# Patient Record
Sex: Female | Born: 1950 | Race: White | Hispanic: No | Marital: Married | Smoking: Former smoker
Health system: Southern US, Community
[De-identification: ages and names within clinical notes are randomized; demographics above are authoritative.]

## PROBLEM LIST (undated history)

## (undated) DIAGNOSIS — K650 Generalized (acute) peritonitis: Secondary | ICD-10-CM

## (undated) DIAGNOSIS — R188 Other ascites: Secondary | ICD-10-CM

## (undated) DIAGNOSIS — E039 Hypothyroidism, unspecified: Secondary | ICD-10-CM

## (undated) DIAGNOSIS — K219 Gastro-esophageal reflux disease without esophagitis: Secondary | ICD-10-CM

## (undated) DIAGNOSIS — E0590 Thyrotoxicosis, unspecified without thyrotoxic crisis or storm: Secondary | ICD-10-CM

## (undated) DIAGNOSIS — I10 Essential (primary) hypertension: Secondary | ICD-10-CM

## (undated) DIAGNOSIS — R519 Headache, unspecified: Secondary | ICD-10-CM

## (undated) DIAGNOSIS — C569 Malignant neoplasm of unspecified ovary: Secondary | ICD-10-CM

## (undated) DIAGNOSIS — R7989 Other specified abnormal findings of blood chemistry: Secondary | ICD-10-CM

## (undated) DIAGNOSIS — C55 Malignant neoplasm of uterus, part unspecified: Secondary | ICD-10-CM

## (undated) DIAGNOSIS — R51 Headache: Secondary | ICD-10-CM

## (undated) HISTORY — PX: THYROIDECTOMY: SHX17

## (undated) HISTORY — DX: Generalized (acute) peritonitis: K65.0

## (undated) HISTORY — PX: OTHER SURGICAL HISTORY: SHX169

## (undated) HISTORY — DX: Malignant neoplasm of uterus, part unspecified: C55

## (undated) HISTORY — DX: Other specified abnormal findings of blood chemistry: R79.89

## (undated) HISTORY — DX: Malignant neoplasm of unspecified ovary: C56.9

## (undated) HISTORY — DX: Other ascites: R18.8

## (undated) HISTORY — DX: Essential (primary) hypertension: I10

---

## 2000-02-22 ENCOUNTER — Other Ambulatory Visit: Admission: RE | Admit: 2000-02-22 | Discharge: 2000-02-22 | Payer: Self-pay | Admitting: Gynecology

## 2000-03-08 ENCOUNTER — Ambulatory Visit (HOSPITAL_COMMUNITY): Admission: RE | Admit: 2000-03-08 | Discharge: 2000-03-08 | Payer: Self-pay | Admitting: Gynecology

## 2002-08-02 ENCOUNTER — Emergency Department (HOSPITAL_COMMUNITY): Admission: EM | Admit: 2002-08-02 | Discharge: 2002-08-02 | Payer: Self-pay | Admitting: Emergency Medicine

## 2002-08-02 ENCOUNTER — Encounter: Payer: Self-pay | Admitting: Emergency Medicine

## 2006-03-18 ENCOUNTER — Emergency Department (HOSPITAL_COMMUNITY): Admission: EM | Admit: 2006-03-18 | Discharge: 2006-03-18 | Payer: Self-pay | Admitting: Emergency Medicine

## 2014-12-25 ENCOUNTER — Encounter: Payer: Self-pay | Admitting: Gynecology

## 2014-12-25 ENCOUNTER — Other Ambulatory Visit (HOSPITAL_COMMUNITY)
Admission: RE | Admit: 2014-12-25 | Discharge: 2014-12-25 | Disposition: A | Discharge status: Home / Self Care | Payer: 59 | Source: Ambulatory Visit | Attending: Gynecology | Admitting: Gynecology

## 2014-12-25 ENCOUNTER — Ambulatory Visit: Payer: 59 | Attending: Gynecology | Admitting: Gynecology

## 2014-12-25 VITALS — BP 130/57 | HR 72 | Temp 97.7°F | Resp 18 | Ht 67.0 in | Wt 183.6 lb

## 2014-12-25 DIAGNOSIS — R938 Abnormal findings on diagnostic imaging of other specified body structures: Secondary | ICD-10-CM | POA: Insufficient documentation

## 2014-12-25 DIAGNOSIS — C799 Secondary malignant neoplasm of unspecified site: Secondary | ICD-10-CM

## 2014-12-25 DIAGNOSIS — R180 Malignant ascites: Secondary | ICD-10-CM | POA: Insufficient documentation

## 2014-12-25 DIAGNOSIS — Z01411 Encounter for gynecological examination (general) (routine) with abnormal findings: Secondary | ICD-10-CM | POA: Insufficient documentation

## 2014-12-25 DIAGNOSIS — Z791 Long term (current) use of non-steroidal anti-inflammatories (NSAID): Secondary | ICD-10-CM | POA: Diagnosis not present

## 2014-12-25 DIAGNOSIS — N882 Stricture and stenosis of cervix uteri: Secondary | ICD-10-CM

## 2014-12-25 DIAGNOSIS — I10 Essential (primary) hypertension: Secondary | ICD-10-CM | POA: Diagnosis not present

## 2014-12-25 DIAGNOSIS — N950 Postmenopausal bleeding: Secondary | ICD-10-CM | POA: Insufficient documentation

## 2014-12-25 DIAGNOSIS — C801 Malignant (primary) neoplasm, unspecified: Secondary | ICD-10-CM | POA: Insufficient documentation

## 2014-12-25 DIAGNOSIS — M4802 Spinal stenosis, cervical region: Secondary | ICD-10-CM | POA: Diagnosis not present

## 2014-12-25 NOTE — Patient Instructions (Addendum)
Preparing for your Surgery  Plan for surgery on February 23 with Dr. Denman George.  Pre-operative Testing -You will receive a phone call from presurgical testing at [facility] to arrange for a pre-operative testing appointment before your surgery.  This appointment normally occurs one to two weeks before your scheduled surgery.   -Bring your insurance card, copy of an advanced directive if applicable, medication list  -At that visit, you will be asked to sign a consent for a possible blood transfusion in case a transfusion becomes necessary during surgery.  The need for a blood transfusion is rare but having consent is a necessary part of your care.     -You should not be taking blood thinners or aspirin at least ten days prior to surgery unless instructed by your surgeon.  Day Before Surgery at [place] will be asked to take in only clear liquids the day before surgery.  Examples of clear liquids include broths, jello, and clear juices. You will be advised to have nothing to eat or drink after midnight the evening before.    Your role in recovery Your role is to become active as soon as directed by your doctor, while still giving yourself time to heal.  Rest when you feel tired. You will be asked to do the following in order to speed your recovery:  - Cough and breathe deeply. This helps toclear and expand your lungs and can prevent pneumonia. You may be given a spirometer to practice deep breathing. A staff member will show you how to use the spirometer. - Do mild physical activity. Walking or moving your legs help your circulation and body functions return to normal. A staff member will help you when you try to walk and will provide you with simple exercises. Do not try to get up or walk alone the first time. - Actively manage your pain. Managing your pain lets you move in comfort. We will ask you to rate your pain on a scale of zero to 10. It is your responsibility to tell  your doctor or nurse where and how much you hurt so your pain can be treated.  Special Considerations -If you are diabetic, you may be placed on insulin after surgery to have closer control over your blood sugars to promote healing and recovery.  This does not mean that you will be discharged on insulin.  If applicable, your oral antidiabetics will be resumed when you are tolerating a solid diet.  -Your final pathology results from surgery should be available by the Friday after surgery and the results will be relayed to you when available.  Blood Transfusion Information WHAT IS A BLOOD TRANSFUSION? A transfusion is the replacement of blood or some of its parts. Blood is made up of multiple cells which provide different functions.  Red blood cells carry oxygen and are used for blood loss replacement.  White blood cells fight against infection.  Platelets control bleeding.  Plasma helps clot blood.  Other blood products are available for specialized needs, such as hemophilia or other clotting disorders. BEFORE THE TRANSFUSION  Who gives blood for transfusions?   You may be able to donate blood to be used at a later date on yourself (autologous donation).  Relatives can be asked to donate blood. This is generally not any safer than if you have received blood from a stranger. The same precautions are taken to ensure safety when a relative's blood is donated.  Healthy volunteers who are fully evaluated  to make sure their blood is safe. This is blood bank blood. Transfusion therapy is the safest it has ever been in the practice of medicine. Before blood is taken from a donor, a complete history is taken to make sure that person has no history of diseases nor engages in risky social behavior (examples are intravenous drug use or sexual activity with multiple partners). The donor's travel history is screened to minimize risk of transmitting infections, such as malaria. The donated blood is  tested for signs of infectious diseases, such as HIV and hepatitis. The blood is then tested to be sure it is compatible with you in order to minimize the chance of a transfusion reaction. If you or a relative donates blood, this is often done in anticipation of surgery and is not appropriate for emergency situations. It takes many days to process the donated blood. RISKS AND COMPLICATIONS Although transfusion therapy is very safe and saves many lives, the main dangers of transfusion include:   Getting an infectious disease.  Developing a transfusion reaction. This is an allergic reaction to something in the blood you were given. Every precaution is taken to prevent this. The decision to have a blood transfusion has been considered carefully by your caregiver before blood is given. Blood is not given unless the benefits outweigh the risks.

## 2014-12-25 NOTE — Progress Notes (Signed)
Consult Note: Gyn-Onc   Jessica Payne 64 y.o. female  Chief Complaint  Patient presents with  . metastatic carcinoma    Assessment : Malignant ascites, postmenopausal bleeding, thickened endometrium which we are unable to sample secondary to cervical stenosis. 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  Plan: I reviewed with the patient and her son and husband the findings noted above and indicated I believe she either had ovarian cancer or endometrial cancer that was metastatic and the peritoneal cavity. I recommend she undergo exploratory laparotomy told not instructed bilateral salpingo-oophorectomy and omentectomy. Intraoperative frozen section would guide any further surgical staging. Risks of surgery were reviewed and questions are answered. 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  Options regarding where surgical procedure will be performed were discussed. Patient was offered to have surgery at [facility] on February 16 or [facility] on February 23 with Dr. Everitt Amber. The patient prefers to have surgery in [place] on the 23rd. We'll obtain a CA-125 as part of her preoperative workup. The patient understands that if she becomes uncomfortable, we can perform paracentesis prior to surgery.  HPI: 64 year old white female seen in consultation at the request of Dr.Saunders [facility]) regarding management of newly diagnosed malignant ascites.  The patient reports appropriate approximate 6 months she's noted abdominal distention which has progressively worsened. She underwent paracentesis on 12/11/2014 removing 3.5 L of ascites. 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 The patient underwent imaging with an ultrasound CT and MRI which demonstrated omental thickening, extensive ascites,  slight enlargement of the right adnexa and a thickened endometrium (1.1 cm). On direct questioning the patient admits to having postmenopausal bleeding for the past 6 months. She also reports that she's had an abnormal Pap smear in the last 6 months but due to lack of insurance did not have that evaluated further.  The patient [facility] with abdominal pain and presumed peritonitis. The ascites fluid did not grow any bacteria. The patient was treated with a one-week course of Rocephin. Her pain is resolved she's had no fevers.  The patient denies any family history of breast, endometrial, or ovarian cancer.    Review of Systems:10 point review of systems is negative except as noted in interval history.   Vitals: Blood pressure 130/57, pulse 72, temperature 97.7 F (36.5 C), temperature source Oral, resp. rate 18, height 5\' 7"  (1.702 m), weight 183 lb 9.6 oz (83.28 kg).  Physical Exam: General : The patient is a healthy woman in no acute distress.  HEENT: normocephalic, extraoccular movements normal; neck is supple without thyromegally  Lynphnodes: Supraclavicular and inguinal nodes not enlarged  Abdomen: Distended and tense with shifting dullness consistent with ascites. no organomegally, no masses, no hernias  Pelvic:  EGBUS: Normal female  Vagina: Normal, no lesions  Urethra and Bladder: Normal, non-tender  Cervix: Appears normal although there is blood from the cervical os. The cervix is stenotic and I am unable to sound the cervix or dilated with an os finder Uterus: Anterior normal shape size and consistency. Bi-manual examination: Non-tender; no adenxal masses or nodularity  Rectal: normal sphincter tone, no masses, no blood  Lower extremities: No edema or varicosities. Normal range of motion     Procedure note: I attempted to obtain an endometrial biopsy but the cervix is stenotic. I'm unable to dilate the  cervix with an os finder. Pap smear  was obtained.   Allergies  Allergen Reactions  . Ciprofloxacin   . Penicillins Hives  . Tramadol Nausea And Vomiting    Past Medical History  Diagnosis Date  . Hypertension   . Acute peritonitis   . Ascites   . Low TSH level     Past Surgical History  Procedure Laterality Date  . Thyroidectomy      Current Outpatient Prescriptions  Medication Sig Dispense Refill  . furosemide (LASIX) 20 MG tablet Take 20 mg by mouth daily.    Marland Kitchen lisinopril (PRINIVIL,ZESTRIL) 10 MG tablet Take 10 mg by mouth daily.    Marland Kitchen NAPROXEN PO Take 440 mg by mouth as needed.    Marland Kitchen oxyCODONE-acetaminophen (PERCOCET/ROXICET) 5-325 MG per tablet Take by mouth every 6 (six) hours as needed.    Marland Kitchen spironolactone (ALDACTONE) 50 MG tablet Take 50 mg by mouth daily.    Marland Kitchen dicyclomine (BENTYL) 10 MG capsule Take 10 mg by mouth 4 (four) times daily -  before meals and at bedtime.     No current facility-administered medications for this visit.    History   Social History  . Marital Status: Married    Spouse Name: N/A    Number of Children: N/A  . Years of Education: N/A   Occupational History  . Not on file.   Social History Main Topics  . Smoking status: Not on file  . Smokeless tobacco: Not on file  . Alcohol Use: Not on file  . Drug Use: Not on file  . Sexual Activity: Not on file   Other Topics Concern  . Not on file   Social History Narrative  . No narrative on file    No family history on file.    CLARKE-PEARSON,Emanuel Campos L, MD 12/25/2014, 10:13 AM

## 2014-12-29 LAB — CYTOLOGY - PAP

## 2014-12-30 ENCOUNTER — Other Ambulatory Visit: Payer: Self-pay | Admitting: Gynecologic Oncology

## 2014-12-30 DIAGNOSIS — R180 Malignant ascites: Secondary | ICD-10-CM

## 2014-12-30 NOTE — Progress Notes (Signed)
Patient requesting paracentesis.  Appt made for Monday, 15 th at 2:00 pm with arrival at 1:45pm.  Patient verbalizing understanding and to call if symptoms worsen so ultrasound can be called to see if an earlier opening has become available.

## 2015-01-04 ENCOUNTER — Ambulatory Visit (HOSPITAL_COMMUNITY)
Admission: RE | Admit: 2015-01-04 | Discharge: 2015-01-04 | Disposition: A | Discharge status: Home / Self Care | Payer: 59 | Source: Ambulatory Visit | Attending: Gynecologic Oncology | Admitting: Gynecologic Oncology

## 2015-01-04 DIAGNOSIS — R180 Malignant ascites: Secondary | ICD-10-CM | POA: Diagnosis not present

## 2015-01-04 NOTE — Procedures (Signed)
US guided therapeutic paracentesis performed yielding 4.2 liters slightly turbid, yellow fluid. No immediate complications.

## 2015-01-05 NOTE — Patient Instructions (Addendum)
Sarabi Sockwell  01/05/2015   Your procedure is scheduled on: 01/12/15    Report to [facility] Main  Entrance and follow signs to               [place] at       1215pm  Call this number if you have problems the morning of surgery [phone]   Remember: clear liquid diet beginning on Monday 01/11/2015.    Do not eat food after midnite..  May have clear liquids until 0730am morning of surgery then nothing my mouth.       Take these medicines the morning of surgery with A SIP OF WATER: none                                You may not have any metal on your body including hair pins and              piercings  Do not wear jewelry, make-up, lotions, powders or perfumes.             Do not wear nail polish.  Do not shave  48 hours prior to surgery.     Do not bring valuables to the hospital. [place].  Contacts, dentures or bridgework may not be worn into surgery.  Leave suitcase in the car. After surgery it may be brought to your room.         Special Instructions: coughing and deep breathing exercises, leg exercises               Please read over the following fact sheets you were given: _____________________________________________________________________                CLEAR LIQUID DIET   Foods Allowed                                                                     Foods Excluded  Coffee and tea, regular and decaf                             liquids that you cannot  Plain Jell-O in any flavor                                             see through such as: Fruit ices (not with fruit pulp)                                     milk, soups, orange juice  Iced Popsicles                                    All solid food Carbonated beverages, regular and  diet                                    Cranberry, grape and apple juices Sports drinks like Gatorade Lightly seasoned clear broth or  consume(fat free) Sugar, honey syrup  Sample Menu Breakfast                                Lunch                                     Supper Cranberry juice                    Beef broth                            Chicken broth Jell-O                                     Grape juice                           Apple juice Coffee or tea                        Jell-O                                      Popsicle                                                Coffee or tea                        Coffee or tea  _____________________________________________________________________  [facility] - Preparing for Surgery Before surgery, you can play an important role.  Because skin is not sterile, your skin needs to be as free of germs as possible.  You can reduce the number of germs on your skin by washing with CHG (chlorahexidine gluconate) soap before surgery.  CHG is an antiseptic cleaner which kills germs and bonds with the skin to continue killing germs even after washing. Please DO NOT use if you have an allergy to CHG or antibacterial soaps.  If your skin becomes reddened/irritated stop using the CHG and inform your nurse when you arrive at Short Stay. Do not shave (including legs and underarms) for at least 48 hours prior to the first CHG shower.  You may shave your face/neck. Please follow these instructions carefully:  1.  Shower with CHG Soap the night before surgery and the  morning of Surgery.  2.  If you choose to wash your hair, wash your hair first as usual with your  normal  shampoo.  3.  After you shampoo, rinse your hair and body thoroughly to remove the  shampoo.  4.  Use CHG as you would any other liquid soap.  You can apply chg directly  to the skin and wash                       Gently with a scrungie or clean washcloth.  5.  Apply the CHG Soap to your body ONLY FROM THE NECK DOWN.   Do not use on face/ open                           Wound or open sores.  Avoid contact with eyes, ears mouth and genitals (private parts).                       Wash face,  Genitals (private parts) with your normal soap.             6.  Wash thoroughly, paying special attention to the area where your surgery  will be performed.  7.  Thoroughly rinse your body with warm water from the neck down.  8.  DO NOT shower/wash with your normal soap after using and rinsing off  the CHG Soap.                9.  Pat yourself dry with a clean towel.            10.  Wear clean pajamas.            11.  Place clean sheets on your bed the night of your first shower and do not  sleep with pets. Day of Surgery : Do not apply any lotions/deodorants the morning of surgery.  Please wear clean clothes to the hospital/surgery center.  FAILURE TO FOLLOW THESE INSTRUCTIONS MAY RESULT IN THE CANCELLATION OF YOUR SURGERY PATIENT SIGNATURE_________________________________  NURSE SIGNATURE__________________________________  ________________________________________________________________________  WHAT IS A BLOOD TRANSFUSION? Blood Transfusion Information  A transfusion is the replacement of blood or some of its parts. Blood is made up of multiple cells which provide different functions.  Red blood cells carry oxygen and are used for blood loss replacement.  White blood cells fight against infection.  Platelets control bleeding.  Plasma helps clot blood.  Other blood products are available for specialized needs, such as hemophilia or other clotting disorders. BEFORE THE TRANSFUSION  Who gives blood for transfusions?   Healthy volunteers who are fully evaluated to make sure their blood is safe. This is blood bank blood. Transfusion therapy is the safest it has ever been in the practice of medicine. Before blood is taken from a donor, a complete history is taken to make sure that person has no history of diseases nor engages in risky social behavior (examples are intravenous drug use  or sexual activity with multiple partners). The donor's travel history is screened to minimize risk of transmitting infections, such as malaria. The donated blood is tested for signs of infectious diseases, such as HIV and hepatitis. The blood is then tested to be sure it is compatible with you in order to minimize the chance of a transfusion reaction. If you or a relative donates blood, this is often done in anticipation of surgery and is not appropriate for emergency situations. It takes many days to process the donated blood. RISKS AND COMPLICATIONS Although transfusion therapy is very safe and saves many lives, the main dangers of transfusion include:  1. Getting an infectious disease. 2. Developing a transfusion reaction.  This is an allergic reaction to something in the blood you were given. Every precaution is taken to prevent this. The decision to have a blood transfusion has been considered carefully by your caregiver before blood is given. Blood is not given unless the benefits outweigh the risks. AFTER THE TRANSFUSION  Right after receiving a blood transfusion, you will usually feel much better and more energetic. This is especially true if your red blood cells have gotten low (anemic). The transfusion raises the level of the red blood cells which carry oxygen, and this usually causes an energy increase.  The nurse administering the transfusion will monitor you carefully for complications. HOME CARE INSTRUCTIONS  No special instructions are needed after a transfusion. You may find your energy is better. Speak with your caregiver about any limitations on activity for underlying diseases you may have. SEEK MEDICAL CARE IF:   Your condition is not improving after your transfusion.  You develop redness or irritation at the intravenous (IV) site. SEEK IMMEDIATE MEDICAL CARE IF:  Any of the following symptoms occur over the next 12 hours:  Shaking chills.  You have a temperature by mouth  above 102 F (38.9 C), not controlled by medicine.  Chest, back, or muscle pain.  People around you feel you are not acting correctly or are confused.  Shortness of breath or difficulty breathing.  Dizziness and fainting.  You get a rash or develop hives.  You have a decrease in urine output.  Your urine turns a dark color or changes to pink, red, or brown. Any of the following symptoms occur over the next 10 days:  You have a temperature by mouth above 102 F (38.9 C), not controlled by medicine.  Shortness of breath.  Weakness after normal activity.  The white part of the eye turns yellow (jaundice).  You have a decrease in the amount of urine or are urinating less often.  Your urine turns a dark color or changes to pink, red, or brown. Document Released: 11/03/2000 Document Revised: 01/29/2012 Document Reviewed: 06/22/2008 ExitCare Patient Information 2014 [place].  _______________________________________________________________________  Incentive Spirometer  An incentive spirometer is a tool that can help keep your lungs clear and active. This tool measures how well you are filling your lungs with each breath. Taking long deep breaths may help reverse or decrease the chance of developing breathing (pulmonary) problems (especially infection) following:  A long period of time when you are unable to move or be active. BEFORE THE PROCEDURE   If the spirometer includes an indicator to show your best effort, your nurse or respiratory therapist will set it to a desired goal.  If possible, sit up straight or lean slightly forward. Try not to slouch.  Hold the incentive spirometer in an upright position. INSTRUCTIONS FOR USE  3. Sit on the edge of your bed if possible, or sit up as far as you can in bed or on a chair. 4. Hold the incentive spirometer in an upright position. 5. Breathe out normally. 6. Place the mouthpiece in your mouth and seal your lips tightly  around it. 7. Breathe in slowly and as deeply as possible, raising the piston or the ball toward the top of the column. 8. Hold your breath for 3-5 seconds or for as long as possible. Allow the piston or ball to fall to the bottom of the column. 9. Remove the mouthpiece from your mouth and breathe out normally. 10. Rest for a few seconds and repeat Steps  1 through 7 at least 10 times every 1-2 hours when you are awake. Take your time and take a few normal breaths between deep breaths. 11. The spirometer may include an indicator to show your best effort. Use the indicator as a goal to work toward during each repetition. 12. After each set of 10 deep breaths, practice coughing to be sure your lungs are clear. If you have an incision (the cut made at the time of surgery), support your incision when coughing by placing a pillow or rolled up towels firmly against it. Once you are able to get out of bed, walk around indoors and cough well. You may stop using the incentive spirometer when instructed by your caregiver.  RISKS AND COMPLICATIONS  Take your time so you do not get dizzy or light-headed.  If you are in pain, you may need to take or ask for pain medication before doing incentive spirometry. It is harder to take a deep breath if you are having pain. AFTER USE  Rest and breathe slowly and easily.  It can be helpful to keep track of a log of your progress. Your caregiver can provide you with a simple table to help with this. If you are using the spirometer at home, follow these instructions: [place] IF:   You are having difficultly using the spirometer.  You have trouble using the spirometer as often as instructed.  Your pain medication is not giving enough relief while using the spirometer.  You develop fever of 100.5 F (38.1 C) or higher. SEEK IMMEDIATE MEDICAL CARE IF:   You cough up bloody sputum that had not been present before.  You develop fever of 102 F (38.9 C)  or greater.  You develop worsening pain at or near the incision site. MAKE SURE YOU:   Understand these instructions.  Will watch your condition.  Will get help right away if you are not doing well or get worse. Document Released: 03/19/2007 Document Revised: 01/29/2012 Document Reviewed: 05/20/2007 [facility] Patient Information 2014 [place], [place].   ________________________________________________________________________

## 2015-01-07 ENCOUNTER — Encounter (HOSPITAL_COMMUNITY)
Admission: RE | Admit: 2015-01-07 | Discharge: 2015-01-07 | Disposition: A | Discharge status: Home / Self Care | Payer: 59 | Source: Ambulatory Visit | Attending: Gynecologic Oncology | Admitting: Gynecologic Oncology

## 2015-01-07 ENCOUNTER — Ambulatory Visit (HOSPITAL_COMMUNITY)
Admission: RE | Admit: 2015-01-07 | Discharge: 2015-01-07 | Disposition: A | Discharge status: Home / Self Care | Payer: 59 | Source: Ambulatory Visit | Attending: Gynecologic Oncology | Admitting: Gynecologic Oncology

## 2015-01-07 ENCOUNTER — Encounter (HOSPITAL_COMMUNITY): Payer: Self-pay

## 2015-01-07 DIAGNOSIS — I700 Atherosclerosis of aorta: Secondary | ICD-10-CM | POA: Insufficient documentation

## 2015-01-07 DIAGNOSIS — Z90710 Acquired absence of both cervix and uterus: Secondary | ICD-10-CM | POA: Insufficient documentation

## 2015-01-07 DIAGNOSIS — Z01818 Encounter for other preprocedural examination: Secondary | ICD-10-CM | POA: Insufficient documentation

## 2015-01-07 DIAGNOSIS — C799 Secondary malignant neoplasm of unspecified site: Secondary | ICD-10-CM

## 2015-01-07 DIAGNOSIS — C801 Malignant (primary) neoplasm, unspecified: Secondary | ICD-10-CM | POA: Diagnosis not present

## 2015-01-07 HISTORY — DX: Thyrotoxicosis, unspecified without thyrotoxic crisis or storm: E05.90

## 2015-01-07 HISTORY — DX: Gastro-esophageal reflux disease without esophagitis: K21.9

## 2015-01-07 HISTORY — DX: Headache, unspecified: R51.9

## 2015-01-07 HISTORY — DX: Hypothyroidism, unspecified: E03.9

## 2015-01-07 HISTORY — DX: Headache: R51

## 2015-01-07 LAB — COMPREHENSIVE METABOLIC PANEL
ALK PHOS: 88 U/L (ref 39–117)
ALT: 18 U/L (ref 0–35)
ANION GAP: 8 (ref 5–15)
AST: 20 U/L (ref 0–37)
Albumin: 3.1 g/dL — ABNORMAL LOW (ref 3.5–5.2)
BILIRUBIN TOTAL: 0.3 mg/dL (ref 0.3–1.2)
BUN: 15 mg/dL (ref 6–23)
CHLORIDE: 102 mmol/L (ref 96–112)
CO2: 28 mmol/L (ref 19–32)
CREATININE: 0.64 mg/dL (ref 0.50–1.10)
Calcium: 8.6 mg/dL (ref 8.4–10.5)
GFR calc Af Amer: >90 mL/min (ref >90)
GLUCOSE: 111 mg/dL — AB (ref 70–99)
POTASSIUM: 4.2 mmol/L (ref 3.5–5.1)
Sodium: 138 mmol/L (ref 135–145)
Total Protein: 6.5 g/dL (ref 6.0–8.3)

## 2015-01-07 LAB — URINE MICROSCOPIC-ADD ON

## 2015-01-07 LAB — CBC WITH DIFFERENTIAL/PLATELET
BASOS PCT: 0 % (ref 0–1)
Basophils Absolute: 0 10*3/uL (ref 0.0–0.1)
EOS ABS: 0.2 10*3/uL (ref 0.0–0.7)
Eosinophils Relative: 2 % (ref 0–5)
HEMATOCRIT: 41.1 % (ref 36.0–46.0)
Hemoglobin: 13.7 g/dL (ref 12.0–15.0)
Lymphocytes Relative: 28 % (ref 12–46)
Lymphs Abs: 2.5 10*3/uL (ref 0.7–4.0)
MCH: 29.3 pg (ref 26.0–34.0)
MCHC: 33.3 g/dL (ref 30.0–36.0)
MCV: 88 fL (ref 78.0–100.0)
MONO ABS: 0.9 10*3/uL (ref 0.1–1.0)
Monocytes Relative: 10 % (ref 3–12)
Neutro Abs: 5.5 10*3/uL (ref 1.7–7.7)
Neutrophils Relative %: 60 % (ref 43–77)
Platelets: 293 10*3/uL (ref 150–400)
RBC: 4.67 MIL/uL (ref 3.87–5.11)
RDW: 13.3 % (ref 11.5–15.5)
WBC: 9.2 10*3/uL (ref 4.0–10.5)

## 2015-01-07 LAB — URINALYSIS, ROUTINE W REFLEX MICROSCOPIC
BILIRUBIN URINE: NEGATIVE
Glucose, UA: NEGATIVE mg/dL
KETONES UR: NEGATIVE mg/dL
Leukocytes, UA: NEGATIVE
NITRITE: NEGATIVE
PROTEIN: NEGATIVE mg/dL
Specific Gravity, Urine: 1.019 (ref 1.005–1.030)
UROBILINOGEN UA: 0.2 mg/dL (ref 0.0–1.0)
pH: 5.5 (ref 5.0–8.0)

## 2015-01-07 LAB — ABO/RH: ABO/RH(D): O NEG

## 2015-01-08 LAB — CA 125: CA 125: 1941 U/mL — ABNORMAL HIGH (ref 0.0–34.0)

## 2015-01-11 MED ORDER — DEXTROSE 5 % IV SOLN
5.0000 mg/kg | INTRAVENOUS | Status: AC
  Administered 2015-01-12: 400 mg via INTRAVENOUS
  Filled 2015-01-11: qty 10

## 2015-01-11 MED ORDER — CLINDAMYCIN PHOSPHATE 900 MG/50ML IV SOLN
900.0000 mg | INTRAVENOUS | Status: AC
  Administered 2015-01-12: 900 mg via INTRAVENOUS

## 2015-01-12 ENCOUNTER — Encounter (HOSPITAL_COMMUNITY): Payer: Self-pay | Admitting: Anesthesiology

## 2015-01-12 ENCOUNTER — Encounter (HOSPITAL_COMMUNITY)
Admission: RE | Disposition: A | Discharge status: Home / Self Care | Payer: Self-pay | Source: Ambulatory Visit | Attending: Obstetrics & Gynecology

## 2015-01-12 ENCOUNTER — Inpatient Hospital Stay (HOSPITAL_COMMUNITY): Payer: 59 | Admitting: Anesthesiology

## 2015-01-12 ENCOUNTER — Inpatient Hospital Stay (HOSPITAL_COMMUNITY)
Admission: RE | Admit: 2015-01-12 | Discharge: 2015-01-16 | DRG: 740 | Disposition: A | Discharge status: Home / Self Care | Payer: 59 | Source: Ambulatory Visit | Attending: Obstetrics & Gynecology | Admitting: Obstetrics & Gynecology

## 2015-01-12 DIAGNOSIS — I10 Essential (primary) hypertension: Secondary | ICD-10-CM | POA: Diagnosis present

## 2015-01-12 DIAGNOSIS — C541 Malignant neoplasm of endometrium: Secondary | ICD-10-CM | POA: Diagnosis present

## 2015-01-12 DIAGNOSIS — N950 Postmenopausal bleeding: Secondary | ICD-10-CM | POA: Diagnosis present

## 2015-01-12 DIAGNOSIS — R180 Malignant ascites: Secondary | ICD-10-CM | POA: Diagnosis present

## 2015-01-12 DIAGNOSIS — K219 Gastro-esophageal reflux disease without esophagitis: Secondary | ICD-10-CM

## 2015-01-12 DIAGNOSIS — C55 Malignant neoplasm of uterus, part unspecified: Secondary | ICD-10-CM

## 2015-01-12 DIAGNOSIS — N882 Stricture and stenosis of cervix uteri: Secondary | ICD-10-CM

## 2015-01-12 DIAGNOSIS — Z87891 Personal history of nicotine dependence: Secondary | ICD-10-CM

## 2015-01-12 HISTORY — PX: SALPINGOOPHORECTOMY: SHX82

## 2015-01-12 HISTORY — PX: ABDOMINAL HYSTERECTOMY: SHX81

## 2015-01-12 HISTORY — PX: OMENTECTOMY: SHX5985

## 2015-01-12 HISTORY — PX: LAPAROTOMY: SHX154

## 2015-01-12 LAB — TYPE AND SCREEN
ABO/RH(D): O NEG
Antibody Screen: NEGATIVE

## 2015-01-12 SURGERY — LAPAROTOMY, EXPLORATORY
Anesthesia: General

## 2015-01-12 MED ORDER — EPHEDRINE SULFATE 50 MG/ML IJ SOLN
INTRAMUSCULAR | Status: DC | PRN
  Administered 2015-01-12: 10 mg via INTRAVENOUS
  Administered 2015-01-12: 5 mg via INTRAVENOUS
  Administered 2015-01-12: 10 mg via INTRAVENOUS

## 2015-01-12 MED ORDER — MIDAZOLAM HCL 5 MG/5ML IJ SOLN
INTRAMUSCULAR | Status: DC | PRN
  Administered 2015-01-12: 2 mg via INTRAVENOUS

## 2015-01-12 MED ORDER — ONDANSETRON HCL 4 MG/2ML IJ SOLN
INTRAMUSCULAR | Status: AC
  Filled 2015-01-12: qty 2

## 2015-01-12 MED ORDER — ROCURONIUM BROMIDE 100 MG/10ML IV SOLN
INTRAVENOUS | Status: AC
Start: 2015-01-12 — End: 2015-01-12
  Filled 2015-01-12: qty 1

## 2015-01-12 MED ORDER — SODIUM CHLORIDE 0.9 % IJ SOLN
INTRAMUSCULAR | Status: AC
  Filled 2015-01-12: qty 20

## 2015-01-12 MED ORDER — CLINDAMYCIN PHOSPHATE 900 MG/50ML IV SOLN
INTRAVENOUS | Status: AC
  Filled 2015-01-12: qty 50

## 2015-01-12 MED ORDER — 0.9 % SODIUM CHLORIDE (POUR BTL) OPTIME
TOPICAL | Status: DC | PRN
  Administered 2015-01-12: 2000 mL

## 2015-01-12 MED ORDER — ROCURONIUM BROMIDE 100 MG/10ML IV SOLN
INTRAVENOUS | Status: DC | PRN
  Administered 2015-01-12: 10 mg via INTRAVENOUS
  Administered 2015-01-12: 50 mg via INTRAVENOUS
  Administered 2015-01-12: 10 mg via INTRAVENOUS

## 2015-01-12 MED ORDER — LACTATED RINGERS IV SOLN
INTRAVENOUS | Status: DC | PRN
  Administered 2015-01-12 (×3): via INTRAVENOUS

## 2015-01-12 MED ORDER — ENOXAPARIN SODIUM 40 MG/0.4ML ~~LOC~~ SOLN
40.0000 mg | SUBCUTANEOUS | Status: DC
  Administered 2015-01-13 – 2015-01-16 (×4): 40 mg via SUBCUTANEOUS
  Filled 2015-01-12 (×4): qty 0.4

## 2015-01-12 MED ORDER — HYDROMORPHONE HCL 1 MG/ML IJ SOLN
0.5000 mg | INTRAMUSCULAR | Status: AC | PRN
  Administered 2015-01-14 (×2): 0.5 mg via INTRAVENOUS
  Filled 2015-01-12 (×3): qty 1

## 2015-01-12 MED ORDER — FENTANYL CITRATE 0.05 MG/ML IJ SOLN
INTRAMUSCULAR | Status: AC
  Filled 2015-01-12: qty 5

## 2015-01-12 MED ORDER — GLYCOPYRROLATE 0.2 MG/ML IJ SOLN
INTRAMUSCULAR | Status: AC
  Filled 2015-01-12: qty 2

## 2015-01-12 MED ORDER — PROPOFOL 10 MG/ML IV BOLUS
INTRAVENOUS | Status: AC
  Filled 2015-01-12: qty 20

## 2015-01-12 MED ORDER — DEXAMETHASONE SODIUM PHOSPHATE 10 MG/ML IJ SOLN
INTRAMUSCULAR | Status: AC
  Filled 2015-01-12: qty 1

## 2015-01-12 MED ORDER — DEXAMETHASONE SODIUM PHOSPHATE 10 MG/ML IJ SOLN
INTRAMUSCULAR | Status: DC | PRN
  Administered 2015-01-12: 10 mg via INTRAVENOUS

## 2015-01-12 MED ORDER — GLYCOPYRROLATE 0.2 MG/ML IJ SOLN
INTRAMUSCULAR | Status: DC | PRN
  Administered 2015-01-12: 0.6 mg via INTRAVENOUS

## 2015-01-12 MED ORDER — OXYCODONE HCL 5 MG PO TABS
5.0000 mg | ORAL_TABLET | ORAL | Status: DC | PRN
  Administered 2015-01-14 – 2015-01-16 (×4): 5 mg via ORAL
  Filled 2015-01-12 (×4): qty 1

## 2015-01-12 MED ORDER — IBUPROFEN 800 MG PO TABS
800.0000 mg | ORAL_TABLET | Freq: Three times a day (TID) | ORAL | Status: DC
  Administered 2015-01-14 – 2015-01-16 (×7): 800 mg via ORAL
  Filled 2015-01-12 (×12): qty 1

## 2015-01-12 MED ORDER — HYDROMORPHONE HCL 1 MG/ML IJ SOLN
0.2500 mg | INTRAMUSCULAR | Status: DC | PRN
  Administered 2015-01-12 (×2): 0.5 mg via INTRAVENOUS

## 2015-01-12 MED ORDER — KCL IN DEXTROSE-NACL 20-5-0.45 MEQ/L-%-% IV SOLN
INTRAVENOUS | Status: DC
  Administered 2015-01-12: 19:00:00 via INTRAVENOUS
  Administered 2015-01-13: 1000 mL via INTRAVENOUS
  Administered 2015-01-13: 14:00:00 via INTRAVENOUS
  Filled 2015-01-12 (×4): qty 1000

## 2015-01-12 MED ORDER — PROPOFOL 10 MG/ML IV BOLUS
INTRAVENOUS | Status: DC | PRN
  Administered 2015-01-12: 200 mg via INTRAVENOUS

## 2015-01-12 MED ORDER — ONDANSETRON HCL 4 MG/2ML IJ SOLN
INTRAMUSCULAR | Status: DC | PRN
  Administered 2015-01-12: 4 mg via INTRAVENOUS

## 2015-01-12 MED ORDER — ACETAMINOPHEN 10 MG/ML IV SOLN
1000.0000 mg | Freq: Four times a day (QID) | INTRAVENOUS | Status: AC
  Administered 2015-01-12 – 2015-01-13 (×4): 1000 mg via INTRAVENOUS
  Filled 2015-01-12 (×7): qty 100

## 2015-01-12 MED ORDER — GLYCOPYRROLATE 0.2 MG/ML IJ SOLN
INTRAMUSCULAR | Status: AC
  Filled 2015-01-12: qty 1

## 2015-01-12 MED ORDER — MIDAZOLAM HCL 2 MG/2ML IJ SOLN
INTRAMUSCULAR | Status: AC
  Filled 2015-01-12: qty 2

## 2015-01-12 MED ORDER — LIDOCAINE HCL (CARDIAC) 20 MG/ML IV SOLN
INTRAVENOUS | Status: AC
  Filled 2015-01-12: qty 10

## 2015-01-12 MED ORDER — ENOXAPARIN SODIUM 40 MG/0.4ML ~~LOC~~ SOLN
40.0000 mg | SUBCUTANEOUS | Status: AC
  Administered 2015-01-12: 40 mg via SUBCUTANEOUS
  Filled 2015-01-12: qty 0.4

## 2015-01-12 MED ORDER — LACTATED RINGERS IV SOLN
INTRAVENOUS | Status: DC
  Administered 2015-01-12: 1000 mL via INTRAVENOUS
  Administered 2015-01-12: 1 via INTRAVENOUS

## 2015-01-12 MED ORDER — NEOSTIGMINE METHYLSULFATE 10 MG/10ML IV SOLN
INTRAVENOUS | Status: DC | PRN
  Administered 2015-01-12: 4 mg via INTRAVENOUS

## 2015-01-12 MED ORDER — ONDANSETRON HCL 4 MG PO TABS
4.0000 mg | ORAL_TABLET | Freq: Four times a day (QID) | ORAL | Status: DC | PRN
  Filled 2015-01-12: qty 1

## 2015-01-12 MED ORDER — NEOSTIGMINE METHYLSULFATE 10 MG/10ML IV SOLN
INTRAVENOUS | Status: AC
  Filled 2015-01-12: qty 1

## 2015-01-12 MED ORDER — LACTATED RINGERS IV SOLN: INTRAVENOUS | Status: DC

## 2015-01-12 MED ORDER — ONDANSETRON HCL 4 MG/2ML IJ SOLN
4.0000 mg | Freq: Four times a day (QID) | INTRAMUSCULAR | Status: DC | PRN
  Administered 2015-01-12 – 2015-01-14 (×6): 4 mg via INTRAVENOUS
  Filled 2015-01-12 (×7): qty 2

## 2015-01-12 MED ORDER — BUPIVACAINE LIPOSOME 1.3 % IJ SUSP
INTRAMUSCULAR | Status: DC | PRN
Start: 2015-01-12 — End: 2015-01-12
  Administered 2015-01-12: 20 mL

## 2015-01-12 MED ORDER — LISINOPRIL 10 MG PO TABS
10.0000 mg | ORAL_TABLET | Freq: Every day | ORAL | Status: DC
  Administered 2015-01-12 – 2015-01-14 (×2): 10 mg via ORAL
  Filled 2015-01-12 (×5): qty 1

## 2015-01-12 MED ORDER — MAGNESIUM HYDROXIDE 400 MG/5ML PO SUSP
30.0000 mL | Freq: Three times a day (TID) | ORAL | Status: AC
  Administered 2015-01-12: 30 mL via ORAL
  Filled 2015-01-12 (×2): qty 30

## 2015-01-12 MED ORDER — BUPIVACAINE LIPOSOME 1.3 % IJ SUSP
20.0000 mL | Freq: Once | INTRAMUSCULAR | Status: DC
  Filled 2015-01-12: qty 20

## 2015-01-12 MED ORDER — LIDOCAINE HCL (CARDIAC) 20 MG/ML IV SOLN
INTRAVENOUS | Status: DC | PRN
  Administered 2015-01-12: 50 mg via INTRAVENOUS

## 2015-01-12 MED ORDER — FENTANYL CITRATE 0.05 MG/ML IJ SOLN
INTRAMUSCULAR | Status: DC | PRN
  Administered 2015-01-12: 100 ug via INTRAVENOUS
  Administered 2015-01-12 (×6): 50 ug via INTRAVENOUS

## 2015-01-12 MED ORDER — HYDROMORPHONE HCL 1 MG/ML IJ SOLN
INTRAMUSCULAR | Status: AC
  Filled 2015-01-12: qty 1

## 2015-01-12 MED ORDER — ENSURE COMPLETE PO LIQD: 237.0000 mL | Freq: Two times a day (BID) | ORAL | Status: DC

## 2015-01-12 MED ORDER — PROMETHAZINE HCL 25 MG/ML IJ SOLN: 6.2500 mg | INTRAMUSCULAR | Status: DC | PRN

## 2015-01-12 SURGICAL SUPPLY — 48 items
ATTRACTOMAT 16X20 MAGNETIC DRP (DRAPES) ×2 IMPLANT
BLADE EXTENDED COATED 6.5IN (ELECTRODE) ×3 IMPLANT
CHLORAPREP W/TINT 26ML (MISCELLANEOUS) ×3 IMPLANT
CLIP TI MEDIUM 6 (CLIP) ×2 IMPLANT
CLIP TI MEDIUM LARGE 6 (CLIP) ×4 IMPLANT
CONT SPEC 4OZ CLIKSEAL STRL BL (MISCELLANEOUS) ×2 IMPLANT
COVER SURGICAL LIGHT HANDLE (MISCELLANEOUS) ×3 IMPLANT
DRAPE INCISE IOBAN 66X45 STRL (DRAPES) ×2 IMPLANT
DRAPE UTILITY 15X26 (DRAPE) ×3 IMPLANT
DRAPE WARM FLUID 44X44 (DRAPE) ×3 IMPLANT
DRESSING TELFA ISLAND 4X8 (GAUZE/BANDAGES/DRESSINGS) ×3 IMPLANT
DRSG OPSITE POSTOP 4X10 (GAUZE/BANDAGES/DRESSINGS) ×1 IMPLANT
ELECT LIGASURE SHORT 9 REUSE (ELECTRODE) ×1 IMPLANT
ELECT REM PT RETURN 9FT ADLT (ELECTROSURGICAL) ×3
ELECTRODE REM PT RTRN 9FT ADLT (ELECTROSURGICAL) ×2 IMPLANT
FLOSEAL 10ML (HEMOSTASIS) ×2 IMPLANT
GAUZE SPONGE 4X4 12PLY STRL (GAUZE/BANDAGES/DRESSINGS) ×2 IMPLANT
GAUZE SPONGE 4X4 16PLY XRAY LF (GAUZE/BANDAGES/DRESSINGS) IMPLANT
GLOVE BIO SURGEON STRL SZ 6 (GLOVE) ×6 IMPLANT
GLOVE BIO SURGEON STRL SZ 6.5 (GLOVE) ×6 IMPLANT
GLOVE BIOGEL M STRL SZ7.5 (GLOVE) ×6 IMPLANT
GOWN STRL REUS W/ TWL LRG LVL3 (GOWN DISPOSABLE) ×4 IMPLANT
GOWN STRL REUS W/TWL LRG LVL3 (GOWN DISPOSABLE) ×6
KIT BASIN OR (CUSTOM PROCEDURE TRAY) ×3 IMPLANT
LIQUID BAND (GAUZE/BANDAGES/DRESSINGS) IMPLANT
LOOP VESSEL MAXI BLUE (MISCELLANEOUS) IMPLANT
MARKER SKIN DUAL TIP RULER LAB (MISCELLANEOUS) ×1 IMPLANT
NEEDLE HYPO 22GX1.5 SAFETY (NEEDLE) ×6 IMPLANT
NS IRRIG 1000ML POUR BTL (IV SOLUTION) ×10 IMPLANT
PACK GENERAL/GYN (CUSTOM PROCEDURE TRAY) ×3 IMPLANT
SHEET LAVH (DRAPES) ×3 IMPLANT
SPONGE LAP 18X18 X RAY DECT (DISPOSABLE) ×2 IMPLANT
STAPLER VISISTAT 35W (STAPLE) ×3 IMPLANT
SUT MNCRL AB 4-0 PS2 18 (SUTURE) IMPLANT
SUT PDS AB 1 TP1 96 (SUTURE) ×6 IMPLANT
SUT VIC AB 0 CT1 36 (SUTURE) ×10 IMPLANT
SUT VIC AB 2-0 CT1 36 (SUTURE) ×7 IMPLANT
SUT VIC AB 2-0 CT2 27 (SUTURE) ×18 IMPLANT
SUT VIC AB 2-0 SH 27 (SUTURE) ×6
SUT VIC AB 2-0 SH 27X BRD (SUTURE) ×4 IMPLANT
SUT VIC AB 3-0 CTX 36 (SUTURE) IMPLANT
SUT VICRYL 2 0 18  UND BR (SUTURE) ×1
SUT VICRYL 2 0 18 UND BR (SUTURE) ×2 IMPLANT
SYR 20CC LL (SYRINGE) ×6 IMPLANT
TOWEL OR 17X26 10 PK STRL BLUE (TOWEL DISPOSABLE) ×6 IMPLANT
TOWEL OR NON WOVEN STRL DISP B (DISPOSABLE) ×3 IMPLANT
TRAY FOLEY CATH 14FRSI W/METER (CATHETERS) ×3 IMPLANT
WATER STERILE IRR 1500ML POUR (IV SOLUTION) ×2 IMPLANT

## 2015-01-12 NOTE — H&P (View-Only) (Signed)
Consult Note: Gyn-Onc   Jessica Payne 64 y.o. female  Chief Complaint  Patient presents with  . metastatic carcinoma    Assessment : Malignant ascites, postmenopausal bleeding, thickened endometrium which we are unable to sample secondary to cervical stenosis. 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  Plan: I reviewed with the patient and her son and husband the findings noted above and indicated I believe she either had ovarian cancer or endometrial cancer that was metastatic and the peritoneal cavity. I recommend she undergo exploratory laparotomy told not instructed bilateral salpingo-oophorectomy and omentectomy. Intraoperative frozen section would guide any further surgical staging. Risks of surgery were reviewed and questions are answered. 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  Options regarding where surgical procedure will be performed were discussed. Patient was offered to have surgery at [facility] on February 16 or [facility] on February 23 with Dr. Everitt Amber. The patient prefers to have surgery in [name] on the 23rd. We'll obtain a CA-125 as part of her preoperative workup. The patient understands that if she becomes uncomfortable, we can perform paracentesis prior to surgery.  HPI: 64 year old white female seen in consultation at the request of Dr.Saunders [name]) regarding management of newly diagnosed malignant ascites.  The patient reports appropriate approximate 6 months she's noted abdominal distention which has progressively worsened. She underwent paracentesis on 12/11/2014 removing 3.5 Payne of ascites. 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 The patient underwent imaging with an ultrasound CT and MRI which demonstrated omental thickening, extensive ascites,  slight enlargement of the right adnexa and a thickened endometrium (1.1 cm). On direct questioning the patient admits to having postmenopausal bleeding for the past 6 months. She also reports that she's had an abnormal Pap smear in the last 6 months but due to lack of insurance did not have that evaluated further.  The patient was [facility]2014 with abdominal pain and presumed peritonitis. The ascites fluid did not grow any bacteria. The patient was treated with a one-week course of Rocephin. Her pain is resolved she's had no fevers.  The patient denies any family history of breast, endometrial, or ovarian cancer.    Review of Systems:10 point review of systems is negative except as noted in interval history.   Vitals: Blood pressure 130/57, pulse 72, temperature 97.7 F (36.5 C), temperature source Oral, resp. rate 18, height 5\' 7"  (1.702 m), weight 183 lb 9.6 oz (83.28 kg).  Physical Exam: General : The patient is a healthy woman in no acute distress.  HEENT: normocephalic, extraoccular movements normal; neck is supple without thyromegally  Lynphnodes: Supraclavicular and inguinal nodes not enlarged  Abdomen: Distended and tense with shifting dullness consistent with ascites. no organomegally, no masses, no hernias  Pelvic:  EGBUS: Normal female  Vagina: Normal, no lesions  Urethra and Bladder: Normal, non-tender  Cervix: Appears normal although there is blood from the cervical os. The cervix is stenotic and I am unable to sound the cervix or dilated with an os finder Uterus: Anterior normal shape size and consistency. Bi-manual examination: Non-tender; no adenxal masses or nodularity  Rectal: normal sphincter tone, no masses, no blood  Lower extremities: No edema or varicosities. Normal range of motion     Procedure note: I attempted to obtain an endometrial biopsy but the cervix is stenotic. I'm unable to dilate the  cervix with an os finder. Pap smear  was obtained.   Allergies  Allergen Reactions  . Ciprofloxacin   . Penicillins Hives  . Tramadol Nausea And Vomiting    Past Medical History  Diagnosis Date  . Hypertension   . Acute peritonitis   . Ascites   . Low TSH level     Past Surgical History  Procedure Laterality Date  . Thyroidectomy      Current Outpatient Prescriptions  Medication Sig Dispense Refill  . furosemide (LASIX) 20 MG tablet Take 20 mg by mouth daily.    Marland Kitchen lisinopril (PRINIVIL,ZESTRIL) 10 MG tablet Take 10 mg by mouth daily.    Marland Kitchen NAPROXEN PO Take 440 mg by mouth as needed.    Marland Kitchen oxyCODONE-acetaminophen (PERCOCET/ROXICET) 5-325 MG per tablet Take by mouth every 6 (six) hours as needed.    Marland Kitchen spironolactone (ALDACTONE) 50 MG tablet Take 50 mg by mouth daily.    Marland Kitchen dicyclomine (BENTYL) 10 MG capsule Take 10 mg by mouth 4 (four) times daily -  before meals and at bedtime.     No current facility-administered medications for this visit.    History   Social History  . Marital Status: Married    Spouse Name: N/A    Number of Children: N/A  . Years of Education: N/A   Occupational History  . Not on file.   Social History Main Topics  . Smoking status: Not on file  . Smokeless tobacco: Not on file  . Alcohol Use: Not on file  . Drug Use: Not on file  . Sexual Activity: Not on file   Other Topics Concern  . Not on file   Social History Narrative  . No narrative on file    No family history on file.    JessicaRollins Wrightson L, MD 12/25/2014, 10:13 AM

## 2015-01-12 NOTE — Anesthesia Procedure Notes (Addendum)
Procedure Name: Intubation Date/Time: 01/12/2015 1:42 PM Performed by: Gradyn Shein, Virgel Gess Pre-anesthesia Checklist: Patient identified, Emergency Drugs available, Suction available, Patient being monitored and Timeout performed Patient Re-evaluated:Patient Re-evaluated prior to inductionOxygen Delivery Method: Circle system utilized Preoxygenation: Pre-oxygenation with 100% oxygen Intubation Type: IV induction Ventilation: Mask ventilation without difficulty Laryngoscope Size: Mac and 4 Grade View: Grade II Tube type: Oral Tube size: 7.5 mm Number of attempts: 2 Airway Equipment and Method: Stylet and Bougie stylet Placement Confirmation: ETT inserted through vocal cords under direct vision,  positive ETCO2,  CO2 detector and breath sounds checked- equal and bilateral Secured at: 22 cm Tube secured with: Tape Dental Injury: Teeth and Oropharynx as per pre-operative assessment  Comments: Prominent teeth.

## 2015-01-12 NOTE — Anesthesia Preprocedure Evaluation (Signed)
Anesthesia Evaluation  Patient identified by MRN, date of birth, ID band Patient awake    Reviewed: Allergy & Precautions, NPO status , Patient's Chart, lab work & pertinent test results  Airway Mallampati: II  TM Distance: >3 FB Neck ROM: Full    Dental no notable dental hx.    Pulmonary neg pulmonary ROS, former smoker,  breath sounds clear to auscultation  Pulmonary exam normal       Cardiovascular Exercise Tolerance: Good hypertension, Pt. on medications Rhythm:Regular Rate:Normal     Neuro/Psych  Headaches, negative psych ROS   GI/Hepatic Neg liver ROS, GERD-  Medicated,  Endo/Other  Hypothyroidism Hyperthyroidism   Renal/GU negative Renal ROS  negative genitourinary   Musculoskeletal negative musculoskeletal ROS (+)   Abdominal   Peds negative pediatric ROS (+)  Hematology negative hematology ROS (+)   Anesthesia Other Findings   Reproductive/Obstetrics negative OB ROS                             Anesthesia Physical Anesthesia Plan  ASA: II  Anesthesia Plan: General   Post-op Pain Management:    Induction: Intravenous  Airway Management Planned: Oral ETT  Additional Equipment:   Intra-op Plan:   Post-operative Plan: Extubation in OR  Informed Consent: I have reviewed the patients History and Physical, chart, labs and discussed the procedure including the risks, benefits and alternatives for the proposed anesthesia with the patient or authorized representative who has indicated his/her understanding and acceptance.   Dental advisory given  Plan Discussed with: CRNA  Anesthesia Plan Comments:         Anesthesia Quick Evaluation

## 2015-01-12 NOTE — Op Note (Signed)
OPERATIVE NOTE  Preoperative Diagnosis: 1. Malignant ascites 2. Postmenopausal bleeding   Postoperative Diagnosis: stage IV endometrial cancer    Procedure(s) Performed: Total abdominal hysterectomy,  with bilateral salpingo-oophorectomy, omentectomy radical tumor debulking for metastatic endometrial cancer .  Surgeon: Thereasa Solo, MD.  Assistant Surgeon: Dr Lahoma Crocker, M.D. Assistant: (an MD assistant was necessary for tissue manipulation, retraction and positioning due to the complexity of the case and hospital policies).   Specimens: Uterus, Bilateral tubes / ovaries, omentum. Bladder peritoneum.   Estimated Blood Loss: 500 mL.    Urine Output:50cc  IVF: 5093OI  Complications: None.   Operative Findings: carcinomatosis, 4L ascites. Millial studding of tumor over entire small bowel serosa and mesentery, diaphragm. Plaque of tumor on bladder peritoneum. Tumor on cul de sac peritoneum. Enlarged right ovary. Normal left ovary. Rind of tumor on anterior sigmoid colon. 10cm omental cake from hepatic flexure to splenic flexure.    This represented an optimal cytoreduction (R1) with gross visible disease remaining on the small and large intestine (millial studding) <1cm at any location. 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   Procedure:   The patient was seen in the Holding Room. The risks, benefits, complications, treatment options, and expected outcomes were discussed with the patient.  The patient concurred with the proposed plan, giving informed consent.   The patient was  identified as Jessica Payne  and the procedure verified as BSO, omentectomy, tumor debulking. A Time Out was held and the above information confirmed upon entry to the operating room..  After induction of anesthesia, the patient was draped and prepped in the usual sterile manner.  She was prepped and draped in the normal  sterile fashion in the dorsal lithotomy position in padded Allen stirrups with good attention paid to support of the lower back and lower extremities. Position was adjusted for appropriate support. A Foley catheter was placed to gravity.   A midline vertical incision was made and carried through the subcutaneous tissue to the fascia. The fascial incision was made and extended superiorally. The rectus muscles were separated. The peritoneum was identified and entered. Peritoneal incision was extended longitudinally.  The abdominal cavity was entered sharply and without incident. A Bookwalter retractor was then placed. A survey of the abdomen and pelvis revealed the above findings, which were significant for carcinomatosis, omental cake, grossly normal appearing ovaries.  The omental cake was dissected free from the transverse colon from the hepatic flexure to the splenic flexure using sharp metzenbaum scissor dissection. The lesser sac was entered. The tumor cake was separated from the mesentery of the transverse colon. The short gastric vessels were sealed with ligasure and the infragastric omentum was separated from the greater curvature of the stomach removing all bulky tumor. Hemostasis was confirmed. The colon was closely inspected and was noted to be intact and hemostatic.   After packing the small bowel into the upper abdomen, we performed the hysterectomy and bilateral salpingo-oophorectomy by entering the  pelvic sidewall just posterior to the right round ligament. The pararectal space was developed and the retroperitoneum developed up to the level of the common iliac artery.  The course of the ureter was identified with ease. The right IP was then skeletonized, and taken with ligasure. The ovary was separated from its peritoneal attachments with the bovie with visualization of the ureter at all times.   The sigmoid colon was dissected from its dense tumor attachments to the left  ovary using sharp  dissection. The left retroperitoneal peritoneum was entered parallel to the sigmoid colon attachments and the left ureter was identified in the left retroperitoneal space. Using sharp and monopolar dissection, the left tube and ovary were freed from their peritoneal adhesions to the pelvis and sigmoid colon. The IP ligament was sealed with ligasure. The bladder peritoneum was skeletonized of tumor which was sent as a separate specimen. The bladder flap was then created with sharp dissection with difficulty because of tumor infiltration. The uterine arteries were skeletonized bilaterally and clamped with parametrial clamps and suture ligated. The cardinal ligaments were clamped with parametrial clamps, cut and suture ligated. The vagina was cross clamped and transected and 0-vicryl figure of 8's were used to close the vaginal cuff hemostatically. An additional figure of 8 suture was necessary on the right cardinal ligament and heme clips on the left to achieve hemostasis.  The peritoneal cavity was irrigated and hemostasis was confirmed at all surgical sites.  Residual tumor was present at the small and large bowel and peritoneal surfaces (47mm millial implants).   The fascia was reapproximated with 0 looped PDS using a total of two sutures. The subcutaneous layer was then irrigated copiously.  Exparel long acting local anesthetic was infiltrated into the subcutaneous tissues. The skin was closed with staples. The patient tolerated the procedure well.   Sponge, lap and needle counts were correct x 2.   [name], MD

## 2015-01-12 NOTE — Transfer of Care (Signed)
Immediate Anesthesia Transfer of Care Note  Patient: Jessica Payne  Procedure(s) Performed: Procedure(s): EXPLORATORY LAPAROTOMY, RADICAL DEBULKING (N/A) TOTAL HYSTERECTOMY ABDOMINAL (N/A) BILATERAL SALPINGO OOPHORECTOMY (Bilateral) OMENTECTOMY (N/A)  Patient Location: PACU  Anesthesia Type:General  Level of Consciousness: awake, alert  and oriented  Airway & Oxygen Therapy: Patient Spontanous Breathing and Patient connected to face mask oxygen  Post-op Assessment: Report given to RN  Post vital signs: Reviewed and stable  Last Vitals:  Filed Vitals:   01/12/15 1630  BP: 111/64  Pulse: 108  Temp: 36.4 C  Resp: 15    Complications: No apparent anesthesia complications

## 2015-01-12 NOTE — Interval H&P Note (Signed)
History and Physical Interval Note:  01/12/2015 12:51 PM  Jessica Payne  has presented today for surgery, with the diagnosis of METASTATIC CARCINOMA  The various methods of treatment have been discussed with the patient and family. After consideration of risks, benefits and other options for treatment, the patient has consented to  Procedure(s): EXPLORATORY LAPAROTOMY/POSSIBLE LAPAROTOMY WITH STAGING (N/A) TOTAL HYSTERECTOMY ABDOMINAL (N/A) BILATERAL SALPINGO OOPHORECTOMY (Bilateral) OMENTECTOMY (N/A) as a surgical intervention .  The patient's history has been reviewed, patient examined, no change in status, stable for surgery.  I have reviewed the patient's chart and labs.  Questions were answered to the patient's satisfaction.     [name]

## 2015-01-12 NOTE — Anesthesia Postprocedure Evaluation (Signed)
  Anesthesia Post-op Note  Patient: Jessica Payne  Procedure(s) Performed: Procedure(s) (LRB): EXPLORATORY LAPAROTOMY, RADICAL DEBULKING (N/A) TOTAL HYSTERECTOMY ABDOMINAL (N/A) BILATERAL SALPINGO OOPHORECTOMY (Bilateral) OMENTECTOMY (N/A)  Patient Location: PACU  Anesthesia Type: General  Level of Consciousness: awake and alert   Airway and Oxygen Therapy: Patient Spontanous Breathing  Post-op Pain: mild  Post-op Assessment: Post-op Vital signs reviewed, Patient's Cardiovascular Status Stable, Respiratory Function Stable, Patent Airway and No signs of Nausea or vomiting  Last Vitals:  Filed Vitals:   01/12/15 1715  BP: 107/59  Pulse: 91  Temp:   Resp: 13    Post-op Vital Signs: stable   Complications: No apparent anesthesia complications

## 2015-01-13 ENCOUNTER — Encounter (HOSPITAL_COMMUNITY): Payer: Self-pay | Admitting: Gynecologic Oncology

## 2015-01-13 LAB — CBC
HEMATOCRIT: 34.7 % — AB (ref 36.0–46.0)
HEMOGLOBIN: 11.6 g/dL — AB (ref 12.0–15.0)
MCH: 28.8 pg (ref 26.0–34.0)
MCHC: 33.4 g/dL (ref 30.0–36.0)
MCV: 86.1 fL (ref 78.0–100.0)
Platelets: 282 10*3/uL (ref 150–400)
RBC: 4.03 MIL/uL (ref 3.87–5.11)
RDW: 13.2 % (ref 11.5–15.5)
WBC: 16.6 10*3/uL — ABNORMAL HIGH (ref 4.0–10.5)

## 2015-01-13 LAB — BASIC METABOLIC PANEL
Anion gap: 9 (ref 5–15)
BUN: 11 mg/dL (ref 6–23)
CALCIUM: 8 mg/dL — AB (ref 8.4–10.5)
CO2: 27 mmol/L (ref 19–32)
Chloride: 98 mmol/L (ref 96–112)
Creatinine, Ser: 0.71 mg/dL (ref 0.50–1.10)
GFR calc Af Amer: >90 mL/min (ref >90)
Glucose, Bld: 169 mg/dL — ABNORMAL HIGH (ref 70–99)
POTASSIUM: 4.7 mmol/L (ref 3.5–5.1)
SODIUM: 134 mmol/L — AB (ref 135–145)

## 2015-01-13 MED ORDER — SPIRONOLACTONE 50 MG PO TABS
50.0000 mg | ORAL_TABLET | Freq: Every day | ORAL | Status: DC
  Administered 2015-01-14 – 2015-01-16 (×3): 50 mg via ORAL
  Filled 2015-01-13 (×4): qty 1

## 2015-01-13 MED ORDER — FUROSEMIDE 20 MG PO TABS
20.0000 mg | ORAL_TABLET | Freq: Every day | ORAL | Status: DC
  Administered 2015-01-14 – 2015-01-16 (×3): 20 mg via ORAL
  Filled 2015-01-13 (×4): qty 1

## 2015-01-13 MED ORDER — HOME MED STORE IN PYXIS: 1.0000 | Freq: Once | Status: DC

## 2015-01-13 MED ORDER — LANSOPRAZOLE 15 MG PO CPDR
15.0000 mg | DELAYED_RELEASE_CAPSULE | Freq: Every day | ORAL | Status: DC
  Filled 2015-01-13: qty 1

## 2015-01-13 MED ORDER — FAMOTIDINE 20 MG PO TABS
20.0000 mg | ORAL_TABLET | Freq: Every day | ORAL | Status: DC
  Administered 2015-01-14: 20 mg via ORAL

## 2015-01-13 NOTE — Progress Notes (Signed)
Patient states she kept down a couple sips of Pepsi otherwise unable to eat without nausea.  IV Zofran given.  Patient encouraged to walk but refused to walk at this time.

## 2015-01-13 NOTE — Care Management Note (Signed)
    Page 1 of 1   01/13/2015     11:19:34 AM CARE MANAGEMENT NOTE 01/13/2015  Patient:  Jessica Payne, Jessica Payne   Account Number:  [id number]  Date Initiated:  01/13/2015  Documentation initiated by:  Sunday Spillers  Subjective/Objective Assessment:   64 yo female admitted s/p expl lap, TAH, BSO, omentectomy radical tumor debulking. PTA lived at home with spouse.     Action/Plan:   Home when stable   Anticipated DC Date:  01/16/2015   Anticipated DC Plan:  [place]  CM consult      Choice offered to / List presented to:             Status of service:  Completed, signed off Medicare Important Message given?   (If response is "NO", the following Medicare IM given date fields will be blank) Date Medicare IM given:   Medicare IM given by:   Date Additional Medicare IM given:   Additional Medicare IM given by:    Discharge Disposition:  HOME/SELF CARE  Per UR Regulation:  Reviewed for med. necessity/level of care/duration of stay  If discussed at [place] of Stay Meetings, dates discussed:    Comments:

## 2015-01-13 NOTE — Progress Notes (Signed)
1 Day Post-Op Procedure(s) (LRB): EXPLORATORY LAPAROTOMY, RADICAL DEBULKING (N/A) TOTAL HYSTERECTOMY ABDOMINAL (N/A) BILATERAL SALPINGO OOPHORECTOMY (Bilateral) OMENTECTOMY (N/A)  Subjective: Patient reports retching, small volume emesis with all attempts to eat. Feels similar to what she experienced preop. Felt that taking prevacid helped with this and would like to try home med.  Has had 2 doses of zofran without help. No flatus. Pain well controlled  .    Objective: Vital signs in last 24 hours: Temp:  [97.3 F (36.3 C)-98.8 F (37.1 C)] 98.8 F (37.1 C) (02/24 0602) Pulse Rate:  [71-108] 75 (02/24 0602) Resp:  [12-20] 18 (02/24 0602) BP: (94-153)/(42-79) 110/50 mmHg (02/24 0602) SpO2:  [94 %-100 %] 96 % (02/24 0602) Weight:  [178 lb (80.74 kg)] 178 lb (80.74 kg) (02/23 1312) Last BM Date: 01/12/15  Intake/Output from previous day: 02/23 0701 - 02/24 0700 In: 5887.9 [P.O.:840; I.V.:4847.9; IV Piggyback:200] Out: 2060 [Urine:1560; Blood:500]  Physical Examination: General: alert and cooperative Resp: clear to auscultation bilaterally Cardio: regular rate and rhythm, S1, S2 normal, no murmur, click, rub or gallop GI: soft, non-tender; bowel sounds normal; no masses,  no organomegaly Extremities: extremities normal, atraumatic, no cyanosis or edema Vaginal Bleeding: none  Labs: WBC/Hgb/Hct/Plts:  16.6/11.6/34.7/282 (02/24 0548) BUN/Cr/glu/ALT/AST/amyl/lip:  11/0.71/--/--/--/--/-- (02/24 8088)   Assessment:  64 y.o. s/p Procedure(s): EXPLORATORY LAPAROTOMY, RADICAL DEBULKING TOTAL HYSTERECTOMY ABDOMINAL BILATERAL SALPINGO OOPHORECTOMY OMENTECTOMY: stable Pain:  Pain is well-controlled on  oral medications.  Heme:appropriate Hb postop  ID: no acute infection  CV: hx of htn, will restart lasix and aldactone.  GI:  Tolerating po: No: will try prevacid per patient request. Likely this is delayed return of bowel function secondary to debulking surgery. Anticipate  returning to normal function over the next 48 hours.    . FEN: Decrease IVF given excellent UO and normal creatinine. Will drop to 50cc/hr  Endo: no issues.  Prophylaxis: pharmacologic prophylaxis (with any of the following: patient to be discharged on this x 28 days total).  Plan: Advance diet Encourage ambulation Advance to PO medication plan for lovenox teaching for home use Dispo:  Discharge plan to include : The patient is to be discharged to home in likely 48 hours.   LOS: 1 day    [name] 01/13/2015, 9:12 AM

## 2015-01-14 MED ORDER — BISACODYL 10 MG RE SUPP
10.0000 mg | Freq: Once | RECTAL | Status: AC
  Administered 2015-01-14: 10 mg via RECTAL
  Filled 2015-01-14: qty 1

## 2015-01-14 MED ORDER — BOOST / RESOURCE BREEZE PO LIQD
1.0000 | Freq: Three times a day (TID) | ORAL | Status: DC
  Administered 2015-01-14: 1 via ORAL

## 2015-01-14 MED ORDER — FAMOTIDINE 20 MG PO TABS
20.0000 mg | ORAL_TABLET | Freq: Every day | ORAL | Status: DC
  Administered 2015-01-14 – 2015-01-16 (×3): 20 mg via ORAL

## 2015-01-14 MED ORDER — ENOXAPARIN (LOVENOX) PATIENT EDUCATION KIT
PACK | Freq: Once | Status: AC
  Administered 2015-01-14: 16:00:00
  Filled 2015-01-14: qty 1

## 2015-01-14 MED ORDER — FAMOTIDINE 20 MG PO TABS: 20.0000 mg | ORAL_TABLET | Freq: Every day | ORAL | Status: DC

## 2015-01-14 NOTE — Progress Notes (Signed)
2 Days Post-Op Procedure(s) (LRB): EXPLORATORY LAPAROTOMY, RADICAL DEBULKING (N/A) TOTAL HYSTERECTOMY ABDOMINAL (N/A) BILATERAL SALPINGO OOPHORECTOMY (Bilateral) OMENTECTOMY (N/A)  Subjective: Patient reports moderate nausea last pm that is improving.  "I think I am turning the corner.  I was able to eat some toast and mashed potatoes that are staying down."  Reporting reflux and using prilosec in the past with adequate symptom relief.  Pain relieved with PRN medications.  Ambulating in the halls without difficulty.  Denies chest pain, dyspnea, passing flatus, or having a bowel movement.  No other concerns voiced.    Objective: Vital signs in last 24 hours: Temp:  [97.5 F (36.4 C)-98.9 F (37.2 C)] 97.5 F (36.4 C) (02/25 1400) Pulse Rate:  [82-110] 110 (02/25 1400) Resp:  [18] 18 (02/25 1400) BP: (103-130)/(47-59) 103/47 mmHg (02/25 1400) SpO2:  [90 %-91 %] 90 % (02/25 1400) Last BM Date: 01/11/15  Intake/Output from previous day: 02/24 0701 - 02/25 0700 In: 1536.7 [P.O.:120; I.V.:1416.7] Out: 2550 [Urine:2550]  Physical Examination: General: alert, cooperative and no distress Resp: clear to auscultation bilaterally Cardio: regular rate and rhythm, S1, S2 normal, no murmur, click, rub or gallop GI: incision: midline incision with clear dressing, stained with dry sanguinous drainage and abdomen mildly distended, active bowel sounds, tympanic on percussion Extremities: extremities normal, atraumatic, no cyanosis or edema  Assessment: 64 y.o. s/p Procedure(s): EXPLORATORY LAPAROTOMY, RADICAL DEBULKING TOTAL HYSTERECTOMY ABDOMINAL BILATERAL SALPINGO OOPHORECTOMY OMENTECTOMY: stable Pain:  Pain is well-controlled on PRN medications.  Heme: Stable post-operatively from 01/13/15 labs  CV: BP and HR stable post-operatively.  Hx HTN- Lisinopril ordered.  GI:  Tolerating po: Yes, increasing intake.  Antiemetics PRN.  GU: Adequate output reported.    FEN: Stable post-op from  01/13/15 labs  Prophylaxis: pharmacologic prophylaxis (with any of the following: enoxaparin (Lovenox) 5m SQ 2 hours prior to surgery then every day) and intermittent pneumatic compression boots.  Plan: Advance diet as tolerated Saline lock IV Rocking chair to the room Dulcolax suppository x 1 Resource supplement drinks as tolerated Lovenox teaching kit for discharge Encourage ambulation, IS use, deep breathing, and coughing Continue post-op plan of care per Dr. JDelsa Sale  LOS: 2 days    Deshunda Thackston DEAL 01/14/2015, 3:36 PM

## 2015-01-15 NOTE — Progress Notes (Addendum)
3 Days Post-Op Procedure(s) (LRB): EXPLORATORY LAPAROTOMY, RADICAL DEBULKING (N/A) TOTAL HYSTERECTOMY ABDOMINAL (N/A) BILATERAL SALPINGO OOPHORECTOMY (Bilateral) OMENTECTOMY (N/A)  Subjective: Patient reports feeling better this am.  Drinking liquids with no nausea.  Tolerated potato soup this am.  Pain relieved with PRN medications.  Ambulating in the halls without difficulty.  Passing flatus and having loose stools after suppository yesterday pm.  Denies chest pain, dyspnea.  No other concerns voiced.    Objective: Vital signs in last 24 hours: Temp:  [97.5 F (36.4 C)-99.1 F (37.3 C)] 98.7 F (37.1 C) (02/26 0516) Pulse Rate:  [84-110] 84 (02/26 0516) Resp:  [16-18] 16 (02/26 0516) BP: (103-131)/(47-70) 115/60 mmHg (02/26 0516) SpO2:  [90 %] 90 % (02/26 0516) Last BM Date: 01/15/15  Intake/Output from previous day: 02/25 0701 - 02/26 0700 In: 1280 [P.O.:1080; I.V.:200] Out: 2425 [Urine:2425]  Physical Examination: General: alert, cooperative and no distress Resp: clear to auscultation bilaterally Cardio: regular rate and rhythm, S1, S2 normal, no murmur, click, rub or gallop GI: incision: midline incision with clear dressing, dressing removed, staples midline, no drainage or erythema and abdomen mildly distended, active bowel sounds, less tympanic on percussion Extremities: extremities normal, atraumatic, no cyanosis or edema  Mild fluid wave present during abdominal exam  Assessment: 64 y.o. s/p Procedure(s): EXPLORATORY LAPAROTOMY, RADICAL DEBULKING TOTAL HYSTERECTOMY ABDOMINAL BILATERAL SALPINGO OOPHORECTOMY OMENTECTOMY: stable Pain:  Pain is well-controlled on PRN medications.  Heme: Stable post-operatively from 01/13/15 labs  CV: BP and HR stable post-operatively.  Hx HTN- Lisinopril ordered.  GI:  Tolerating po: Yes, increasing intake.  Antiemetics PRN.  GU: Adequate output reported.    FEN: Stable post-op from 01/13/15 labs  Prophylaxis: pharmacologic  prophylaxis (with any of the following: enoxaparin (Lovenox) 34m SQ 2 hours prior to surgery then every day) and intermittent pneumatic compression boots.  Plan: Continue diet as tolerated Plan for possible discharge later today if solid diet tolerated Resource supplement drinks as tolerated Lovenox teaching kit for discharge Encourage ambulation, IS use, deep breathing, and coughing Continue post-op plan of care per Dr. JDelsa Sale  LOS: 3 days    CROSS, [name] 01/15/2015, 12:00 PM

## 2015-01-16 MED ORDER — ENOXAPARIN SODIUM 40 MG/0.4ML ~~LOC~~ SOLN: 40.0000 mg | SUBCUTANEOUS | Status: DC

## 2015-01-16 MED ORDER — OXYCODONE HCL 5 MG PO TABS: 5.0000 mg | ORAL_TABLET | ORAL | Status: DC | PRN

## 2015-01-16 NOTE — Discharge Instructions (Signed)
01/16/2015  Return to work: 6 weeks  Activity: 1. Be up and out of the bed during the day.  Take a nap if needed.  You may walk up steps but be careful and use the hand rail.  Stair climbing will tire you more than you think, you may need to stop part way and rest.   2. No lifting or straining for 6 weeks.  3. No driving for 1- 2 weeks.  Do Not drive if you are taking narcotic pain medicine.  4. Shower daily.  Use soap and water on your incision and pat dry; don't rub.   5. No sexual activity and nothing in the vagina for 6 weeks.  Diet: 1. Low sodium Heart Healthy Diet is recommended.  2. It is safe to use a laxative if you have difficulty moving your bowels.   Wound Care: 1. Keep clean and dry.  Shower daily.  Reasons to call the Doctor:   Fever - Oral temperature greater than 100.4 degrees Fahrenheit  Foul-smelling vaginal discharge  Difficulty urinating  Nausea and vomiting  Increased pain at the site of the incision that is unrelieved with pain medicine.  Difficulty breathing with or without chest pain  New calf pain especially if only on one side  Sudden, continuing increased vaginal bleeding with or without clots.     Contacts: For questions or concerns you should contact:  Dr. Lahoma Crocker at [phone]  Dr. Everitt Amber at [facility] [phone]  Hysterectomy, Abdominal  Care After Please read the instructions below. Refer to these instructions for the next few weeks. These instructions provide you with general information on caring for yourself after surgery. Your caregiver may also give you specific instructions. While your treatment has been planned according to the most current medical practices available, unavoidable problems sometimes happen. If you have any problems or questions after you leave, please call your caregiver. HOME CARE INSTRUCTIONS Healing will take time. You will have discomfort, tenderness, swelling and bruising at the operative  site for a couple of weeks. This is normal and will get better as time goes on.  9. Only take over-the-counter or prescription medicines for pain, discomfort or fever as directed by your caregiver.  10. Do not take aspirin. It can cause bleeding.  11. Do not drive when taking pain medication.  12. Follow your caregivers advice regarding diet, exercise, lifting, driving and general activities.  13. Resume your usual diet as directed and allowed.  14. Get plenty of rest and sleep.  15. Do not douche, use tampons, or have sexual intercourse until your caregiver gives you permission.  16. Change your bandages (dressings) as directed.  17. Take your temperature twice a day. Write it down.  18. Your caregiver may recommend showers instead of baths for a few weeks.  19. Do not drink alcohol until your caregiver gives you permission.  20. If you develop constipation, you may take a mild laxative with your caregivers permission. Bran foods and drinking fluids helps with constipation problems.  21. Try to have someone home with you for a week or two to help with the household activities.  54. Make sure you and your family understands everything about your operation and recovery.  23. Do not sign any legal documents until you feel normal again.  24. Keep all your follow-up appointments as recommended by your caregiver.  SEEK MEDICAL CARE IF:  There is swelling, redness or increasing pain in the wound area.   Pus is coming  from the wound.   You notice a bad smell from the wound or surgical dressing.   You have pain, redness and swelling from the intravenous site.   The wound is breaking open (the edges are not staying together).   You feel dizzy or feel like fainting.   You develop pain or bleeding when you urinate.   You develop diarrhea.   You develop nausea and vomiting.   You develop abnormal vaginal discharge.   You develop a rash.   You have any type of abnormal reaction or  develop an allergy to your medication.   You need stronger pain medication for your pain.  SEEK IMMEDIATE MEDICAL CARE:  You develop a temperature of 100.6 or higher.   You develop abdominal pain.   You develop chest pain.   You develop shortness of breath.   You pass out.   You develop pain, swelling or redness of your leg.   You develop heavy vaginal bleeding with or without blood clots.  Document Released: 05/26/2005 Document Re-Released: 04/26/2010 [facility] Patient Information 2011 [place].

## 2015-01-16 NOTE — Discharge Summary (Signed)
Physician Discharge Summary  Patient ID: Jessica Payne MRN: [id number] DOB/AGE: [birth year] 64 y.o.  Admit date: 01/12/2015 Discharge date: 01/16/2015  Admission Diagnoses: Uterine cancer  Discharge Diagnoses:  Principal Problem:   Uterine cancer Active Problems:   Malignant ascites   Postmenopausal bleeding   Discharged Condition: good  Hospital Course: On 01/12/2015, the patient underwent the following: Procedure(s): EXPLORATORY LAPAROTOMY, RADICAL DEBULKING TOTAL HYSTERECTOMY ABDOMINAL BILATERAL SALPINGO OOPHORECTOMY OMENTECTOMY.   The postoperative course was uneventful.  She was discharged to home on postoperative day 4 tolerating a regular diet.  Consults: None  Significant Diagnostic Studies: None  Treatments: surgery: see above  Discharge Exam: Blood pressure 107/56, pulse 100, temperature 97.8 F (36.6 C), temperature source Oral, resp. rate 17, height 5\' 7"  (1.702 m), weight 178 lb (80.74 kg), SpO2 92 %. General appearance: alert Resp: clear to auscultation bilaterally Cardio: regular rate and rhythm, S1, S2 normal, no murmur, click, rub or gallop GI: soft, non-tender; bowel sounds normal; no masses,  no organomegaly Extremities: extremities normal, atraumatic, no cyanosis or edema and Homans sign is negative, no sign of DVT Incision/Wound:  C/D/I  Disposition: Final discharge disposition not confirmed      Discharge Instructions    Activity as tolerated - No restrictions    Complete by:  As directed      Call MD for:  extreme fatigue    Complete by:  As directed      Call MD for:  persistant dizziness or light-headedness    Complete by:  As directed      Call MD for:  persistant nausea and vomiting    Complete by:  As directed      Call MD for:  redness, tenderness, or signs of infection (pain, swelling, redness, odor or green/yellow discharge around incision site)    Complete by:  As directed      Call MD for:  severe uncontrolled pain    Complete  by:  As directed      Call MD for:  temperature >100.4    Complete by:  As directed      Diet - low sodium heart healthy    Complete by:  As directed      Discharge wound care:    Complete by:  As directed   Keep clean and dry     Driving Restrictions    Complete by:  As directed   No driving for 1- 2 weeks     Increase activity slowly    Complete by:  As directed      Lifting restrictions    Complete by:  As directed   No lifting > 5 lbs for 6 weeks     May shower / Bathe    Complete by:  As directed   No tub baths for 6 weeks     May walk up steps    Complete by:  As directed      Sexual Activity Restrictions    Complete by:  As directed   No intercourse for 6 - 8 weeks            Medication List    STOP taking these medications        oxyCODONE-acetaminophen 5-325 MG per tablet  Commonly known as:  PERCOCET/ROXICET      TAKE these medications        enoxaparin 40 MG/0.4ML injection  Commonly known as:  LOVENOX  Inject 0.4 mLs (40 mg total) into the skin daily.  famotidine 20 MG tablet  Commonly known as:  PEPCID  Take 20 mg by mouth at bedtime. As needed     furosemide 20 MG tablet  Commonly known as:  LASIX  Take 20 mg by mouth at bedtime.     lisinopril 10 MG tablet  Commonly known as:  PRINIVIL,ZESTRIL  Take 10 mg by mouth at bedtime.     naproxen sodium 220 MG tablet  Commonly known as:  ANAPROX  Take 440 mg by mouth 2 (two) times daily as needed (for pain).     oxyCODONE 5 MG immediate release tablet  Commonly known as:  Oxy IR/ROXICODONE  Take 1 tablet (5 mg total) by mouth every 4 (four) hours as needed for moderate pain or breakthrough pain.     spironolactone 50 MG tablet  Commonly known as:  ALDACTONE  Take 50 mg by mouth at bedtime.       Follow-up Information    Follow up with [name], MD On 01/22/2015.   Specialty:  Obstetrics and Gynecology   Why:  at 1:30 pm at the [facility] for staple removal and post-op  follow up   Contact information:   [place]  [id number] [phone]       Signed: Lahoma Crocker A 01/16/2015, 11:08 AM

## 2015-01-16 NOTE — Progress Notes (Signed)
Assessment unchanged. Pt and sister verbalized understanding of dc instructions through teach back. Scripts x 2 given as provided by MD. Pt demonstrated good aseptic technique and understanding with self administration of Lovenox SQ injection. Lovenox kit at bedside for pt to take home. Discharged via wc to front entrance to meet awaiting vehicle to carry home. Accompanied by NT and sister.

## 2015-01-19 ENCOUNTER — Other Ambulatory Visit: Payer: Self-pay | Admitting: Gynecologic Oncology

## 2015-01-19 DIAGNOSIS — C55 Malignant neoplasm of uterus, part unspecified: Secondary | ICD-10-CM

## 2015-01-20 ENCOUNTER — Telehealth: Payer: Self-pay | Admitting: Oncology

## 2015-01-20 NOTE — Telephone Encounter (Signed)
Melissa in gyn onc will let pt know about appt.with Dr. Marko Plume.

## 2015-01-22 ENCOUNTER — Encounter: Payer: Self-pay | Admitting: Gynecologic Oncology

## 2015-01-22 ENCOUNTER — Ambulatory Visit: Payer: 59 | Attending: Gynecologic Oncology | Admitting: Gynecologic Oncology

## 2015-01-22 VITALS — BP 128/59 | HR 82 | Temp 97.7°F | Resp 18 | Ht 67.0 in | Wt 173.3 lb

## 2015-01-22 DIAGNOSIS — C482 Malignant neoplasm of peritoneum, unspecified: Secondary | ICD-10-CM | POA: Insufficient documentation

## 2015-01-22 DIAGNOSIS — C541 Malignant neoplasm of endometrium: Secondary | ICD-10-CM

## 2015-01-22 DIAGNOSIS — Z483 Aftercare following surgery for neoplasm: Secondary | ICD-10-CM

## 2015-01-22 NOTE — Patient Instructions (Signed)
Dr. Denman George will see you after you complete 6 cycles of chemo. Follow up with Dr. Marko Plume who will be managing your chemotherapy.

## 2015-01-22 NOTE — Progress Notes (Signed)
POSTOPERATIVE FOLLOWUP VISIT & TREATMENT COUNSELING  HPI:  Jessica Payne is a 64 y.o. year old No obstetric history on file. initially seen in consultation on 12/25/14, referred by Dr Tye Savoy, for signs of peritoneal carcinomatosis on imaging and paracentesis revealing adenocarcinoma.  She then underwent an exploratory laparotomy, TAH, BSO, omentectomy and complete tumor debulking surgery to R1 disease on 02/02/39 without complications. Intraoperative findings included 4 L of ascites, tumor studding (1 mm) over entire small bowel serosa, mesentery, diaphragm. A plaque of tumor on the bladder peritoneum. Tumor on the cul-de-sac peritoneum. Enlarged right ovary. Normal left ovary. Rind of tumor on the anterior sigmoid colon. 10 cm omental cake from the hepatic flexure to the splenic flexure. It was a optimal cytoreductive effort with less than 1 cm disease at any residual location and residual tumor remaining with tumor studding on the small and large intestine and mesentery. Frozen section operatively revealed poorly differentiated cancer of both the endometrium and metastatic in the omentum.  Her postoperative course was uncomplicated.  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   Primary peritoneal: Stage IIIC high grade serous involving omentum and peritoneal biopsies with metastatic foci on the left ovary. Endometrial: incompletely staged, at least stage IB serous endometrial cancer with + lymphovascular space invasion, outer half myometrial invasion and a negative cervix. The lymph nodes were not sampled due to the extensive peritoneal tumor (these were not enlarged on preoperative imaging).   She is seen today for a postoperative check and to discuss her pathology results and treatment plan.  Since discharge from the hospital, she is feeling well.  She has improving appetite, normal bowel and bladder function, and pain controlled with minimal PO  medication. She has no other complaints today.    Review of systems: Constitutional:  She has no weight gain or weight loss. She has no fever or chills. Eyes: No blurred vision Ears, Nose, Mouth, Throat: No dizziness, headaches or changes in hearing. No mouth sores. Cardiovascular: No chest pain, palpitations or edema. Respiratory:  No shortness of breath, wheezing or cough Gastrointestinal: She has normal bowel movements without diarrhea or constipation. She denies any nausea or vomiting. She denies blood in her stool or heart burn. Genitourinary:  She denies pelvic pain, pelvic pressure or changes in her urinary function. She has no hematuria, dysuria, or incontinence. She has no irregular vaginal bleeding or vaginal discharge Musculoskeletal: Denies muscle weakness or joint pains.  Skin:  She has no skin changes, rashes or itching Neurological:  Denies dizziness or headaches. No neuropathy, no numbness or tingling. Psychiatric:  She denies depression or anxiety. Hematologic/Lymphatic:   No easy bruising or bleeding   Physical Exam: Blood pressure 128/59, pulse 82, temperature 97.7 F (36.5 C), temperature source Oral, resp. rate 18, height 5\' 7"  (1.702 m), weight 173 lb 4.8 oz (78.608 kg). General: Well dressed, well nourished in no apparent distress.   HEENT:  Normocephalic and atraumatic, no lesions.  Extraocular muscles intact. Sclerae anicteric. Pupils equal, round, reactive. No mouth sores or ulcers. Thyroid is normal size, not nodular, midline. Skin:  No lesions or rashes. Breasts:  deferred. Lungs:  deferred. Cardiovascular:  deferred. Abdomen:  Soft, nontender, nondistended.  No palpable masses.  No hepatosplenomegaly.  No ascites. Normal bowel sounds.  No hernias.  Incision is healing well - staples in though these are removed today Genitourinary: Normal EGBUS  Vaginal cuff intact.  No bleeding or discharge.  No cul de sac  fullness. Extremities: No cyanosis, clubbing or  edema.  No calf tenderness or erythema. No palpable cords. Psychiatric: Mood and affect are appropriate. Neurological: Awake, alert and oriented x 3. Sensation is intact, no neuropathy.  Musculoskeletal: No pain, normal strength and range of motion.  Assessment:    64 y.o. 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   S/p exploratory laparotomy, BSO, TAH, omentectomy, optimal cytoreductive surgery on 01/12/15. The uterine tumor revealed + LVSI, outer half myometrial invasion.    Plan: 1) Pathology reports reviewed today 2) Treatment counseling - I had extensive counseling of the patient and her family regarding the pathology findings and our treatment recommendations. I discussed that the primary focus of therapy should be systemic treatment of her advanced stage high-grade serous primary peritoneal cancer. Her discussed that this is typically treated with 6 cycles of adjuvant carboplatin and paclitaxel chemotherapy. We have referred her to Dr. Marko Plume to initiate this treatment. I discussed that serous uterine cancer has a similar pattern of spread to primary peritoneal cancer. It is also similarly treated with adjuvant carboplatin and paclitaxel chemotherapy. However given the deeply invasive tumor I am also concerned about a particularly high risk for relapse in the pelvis. For this reason I am recommending consideration of adjuvant radiation (whole pelvic RT and vaginal break in therapy) after she has completed her 6 cycles of chemotherapy and is demonstrating complete response to treatment in the peroneal cavity. She was given the opportunity to ask questions, which were answered to her satisfaction, and she is agreement with the above mentioned plan of care.  3)  Return to clinic after completing chemotherapy prior to initiating radiation. She is physically fit to begin  chemotherapy based on today's examination.  [name], MD

## 2015-01-25 ENCOUNTER — Telehealth: Payer: Self-pay | Admitting: Gynecologic Oncology

## 2015-01-25 DIAGNOSIS — C482 Malignant neoplasm of peritoneum, unspecified: Secondary | ICD-10-CM

## 2015-01-25 NOTE — Telephone Encounter (Signed)
Returned call to patient.  Patient reporting pain in her lower abdomen.  Describes the pain as nagging, "pretty much constant but worse at times", stabbing when up which lessens when laying down.  She also reports pain in her back on the right side.  Denies fever, chills, nausea, vomiting, dysuria.  "I feel like the fluid is coming back and feel congested when in bed."  Situation discussed with Dr. Denman George.  Paracentesis scheduled for tomorrow at 1:30pm with a 1:15pm arrival at [facility].  Patient notified.  Chemo education class moved to accomodate for paracentesis.  Reportable signs and symptoms reviewed.  Signs and symptoms to seek emergency care reviewed as well.  Advised to call for any questions or concerns or if her symptoms persist after paracentesis.

## 2015-01-26 ENCOUNTER — Ambulatory Visit (HOSPITAL_COMMUNITY): Payer: 59

## 2015-01-26 ENCOUNTER — Telehealth: Payer: Self-pay | Admitting: *Deleted

## 2015-01-26 ENCOUNTER — Telehealth: Payer: Self-pay | Admitting: Oncology

## 2015-01-26 ENCOUNTER — Other Ambulatory Visit: Payer: 59

## 2015-01-26 NOTE — Telephone Encounter (Signed)
Pt called states " I would like to cancel my paracentesis today. I was in a lot of pain yesterday and asked to have this done, but now the pain is gone and I think it was just gas."Pt denied shortness of breath when lays down, no discomfort in abdomen. Pt very persist about cancelling paracentesis as she feels it was just gas. Pt also requested to change chemo education class to today as she would rather not come on Thursday. Pt transferred to scheduling per pt request to change chemo ed. Appt. Call placed to radiology, appt cancelled per pt request/

## 2015-01-26 NOTE — Telephone Encounter (Signed)
pt cld & left a vm and wanted to r/sappt to 3/9-r/s and gave pt updated time & date

## 2015-01-27 ENCOUNTER — Other Ambulatory Visit: Payer: Self-pay | Admitting: Oncology

## 2015-01-27 ENCOUNTER — Other Ambulatory Visit: Payer: 59

## 2015-01-27 ENCOUNTER — Encounter: Payer: Self-pay | Admitting: *Deleted

## 2015-01-27 DIAGNOSIS — C482 Malignant neoplasm of peritoneum, unspecified: Secondary | ICD-10-CM

## 2015-01-27 DIAGNOSIS — C55 Malignant neoplasm of uterus, part unspecified: Secondary | ICD-10-CM

## 2015-01-28 ENCOUNTER — Other Ambulatory Visit: Payer: 59

## 2015-01-29 ENCOUNTER — Ambulatory Visit: Payer: 59

## 2015-01-29 ENCOUNTER — Telehealth: Payer: Self-pay | Admitting: *Deleted

## 2015-01-29 ENCOUNTER — Other Ambulatory Visit (HOSPITAL_BASED_OUTPATIENT_CLINIC_OR_DEPARTMENT_OTHER): Payer: 59

## 2015-01-29 ENCOUNTER — Ambulatory Visit (HOSPITAL_COMMUNITY)
Admission: RE | Admit: 2015-01-29 | Discharge: 2015-01-29 | Disposition: A | Discharge status: Home / Self Care | Payer: 59 | Source: Ambulatory Visit | Attending: Gynecologic Oncology | Admitting: Gynecologic Oncology

## 2015-01-29 ENCOUNTER — Ambulatory Visit (HOSPITAL_BASED_OUTPATIENT_CLINIC_OR_DEPARTMENT_OTHER): Payer: 59 | Admitting: Oncology

## 2015-01-29 ENCOUNTER — Encounter: Payer: Self-pay | Admitting: Oncology

## 2015-01-29 VITALS — BP 127/81 | HR 73 | Temp 98.4°F | Resp 18 | Ht 67.0 in | Wt 168.0 lb

## 2015-01-29 DIAGNOSIS — C482 Malignant neoplasm of peritoneum, unspecified: Secondary | ICD-10-CM

## 2015-01-29 DIAGNOSIS — Z9009 Acquired absence of other part of head and neck: Secondary | ICD-10-CM

## 2015-01-29 DIAGNOSIS — N950 Postmenopausal bleeding: Secondary | ICD-10-CM

## 2015-01-29 DIAGNOSIS — K5900 Constipation, unspecified: Secondary | ICD-10-CM

## 2015-01-29 DIAGNOSIS — C55 Malignant neoplasm of uterus, part unspecified: Secondary | ICD-10-CM

## 2015-01-29 DIAGNOSIS — K0381 Cracked tooth: Secondary | ICD-10-CM

## 2015-01-29 DIAGNOSIS — N882 Stricture and stenosis of cervix uteri: Secondary | ICD-10-CM

## 2015-01-29 DIAGNOSIS — R180 Malignant ascites: Secondary | ICD-10-CM | POA: Insufficient documentation

## 2015-01-29 DIAGNOSIS — K5904 Chronic idiopathic constipation: Secondary | ICD-10-CM

## 2015-01-29 DIAGNOSIS — E890 Postprocedural hypothyroidism: Secondary | ICD-10-CM

## 2015-01-29 DIAGNOSIS — C541 Malignant neoplasm of endometrium: Secondary | ICD-10-CM

## 2015-01-29 DIAGNOSIS — Z87891 Personal history of nicotine dependence: Secondary | ICD-10-CM

## 2015-01-29 LAB — CBC WITH DIFFERENTIAL/PLATELET
BASO%: 1.2 % (ref 0.0–2.0)
Basophils Absolute: 0.1 10*3/uL (ref 0.0–0.1)
EOS%: 2.3 % (ref 0.0–7.0)
Eosinophils Absolute: 0.2 10*3/uL (ref 0.0–0.5)
HCT: 36 % (ref 34.8–46.6)
HGB: 11.7 g/dL (ref 11.6–15.9)
LYMPH%: 27.3 % (ref 14.0–49.7)
MCH: 28 pg (ref 25.1–34.0)
MCHC: 32.5 g/dL (ref 31.5–36.0)
MCV: 86.3 fL (ref 79.5–101.0)
MONO#: 1.2 10*3/uL — AB (ref 0.1–0.9)
MONO%: 12.9 % (ref 0.0–14.0)
NEUT#: 5.2 10*3/uL (ref 1.5–6.5)
NEUT%: 56.3 % (ref 38.4–76.8)
PLATELETS: 443 10*3/uL — AB (ref 145–400)
RBC: 4.17 10*6/uL (ref 3.70–5.45)
RDW: 14.3 % (ref 11.2–14.5)
WBC: 9.3 10*3/uL (ref 3.9–10.3)
lymph#: 2.5 10*3/uL (ref 0.9–3.3)

## 2015-01-29 LAB — COMPREHENSIVE METABOLIC PANEL (CC13)
ALBUMIN: 2.9 g/dL — AB (ref 3.5–5.0)
ALT: 14 U/L (ref 0–55)
ANION GAP: 11 meq/L (ref 3–11)
AST: 12 U/L (ref 5–34)
Alkaline Phosphatase: 101 U/L (ref 40–150)
BILIRUBIN TOTAL: 0.33 mg/dL (ref 0.20–1.20)
BUN: 14.2 mg/dL (ref 7.0–26.0)
CHLORIDE: 97 meq/L — AB (ref 98–109)
CO2: 26 mEq/L (ref 22–29)
Calcium: 9.3 mg/dL (ref 8.4–10.4)
Creatinine: 0.8 mg/dL (ref 0.6–1.1)
EGFR: 76 mL/min/{1.73_m2} — ABNORMAL LOW (ref >90)
GLUCOSE: 105 mg/dL (ref 70–140)
Potassium: 5.1 mEq/L (ref 3.5–5.1)
Sodium: 135 mEq/L — ABNORMAL LOW (ref 136–145)
TOTAL PROTEIN: 6.9 g/dL (ref 6.4–8.3)

## 2015-01-29 MED ORDER — OXYCODONE HCL 5 MG PO TABS: 5.0000 mg | ORAL_TABLET | ORAL | Status: DC | PRN

## 2015-01-29 MED ORDER — LIDOCAINE HCL (PF) 1 % IJ SOLN
INTRAMUSCULAR | Status: AC
  Filled 2015-01-29: qty 10

## 2015-01-29 NOTE — Progress Notes (Signed)
[place] NEW PATIENT EVALUATION   Name: Jessica Payne Date: January 29, 2015  MRN: [id number] DOB: 02/24/1951  REFERRING PHYSICIAN: Everitt Amber cc Yong Channel, Donalynn Furlong, FNP(with Jeralene Huff, MD [facility])     REASON FOR REFERRAL: IIIC high grade serous primary peritoneal carcinoma and synchronous serous endometrial at least IB.   HISTORY OF PRESENT ILLNESS:Jessica Payne is a 64 y.o. female who is seen in consultation, together with husband, at the request of Dr Denman George with recently diagnosed high grade serous primary peritoneal carcinoma. She is post R1 debulking of IIIC primary peritoneal/ at least IB serous endometrial carcinoma  01-12-15; she has had large volume malignant ascites.      REVIEW OF SYSTEMS as above, also:   Remainder of full 10 point review of systems negative.   ALLERGIES: Ciprofloxacin; Merthiolate; Penicillins; and Tramadol  PAST MEDICAL/ SURGICAL HISTORY:      CURRENT MEDICATIONS: reviewed as listed now in EMR  PHARMACY   SOCIAL HISTORY:   Her first cousin is Raynald Kemp, RN.  FAMILY HISTORY:            PHYSICAL EXAM:  height is _0  (1.702 m) and weight is 168 lb (76.204 kg). Her oral temperature is 98.4 F (36.9 C). Her blood pressure is 127/81 and her pulse is 73. Her respiration is 18 and oxygen saturation is 99%.  Alert, pleasant, cooperative  HEENT:  RESPIRATORY:  CARDIAC/ VASCULAR:  ABDOMEN:  LYMPH NODES:  BREASTS:  NEUROLOGIC:  SKIN:  MUSCULOSKELETAL:    LABORATORY DATA:  Results for orders placed or performed in visit on 01/29/15 (from the past 48 hour(s))  CBC with Differential     Status: Abnormal   Collection Time: 01/29/15  3:13 PM  Result Value Ref Range   WBC 9.3 3.9 - 10.3 10e3/uL   NEUT# 5.2 1.5 - 6.5 10e3/uL   HGB 11.7 11.6 - 15.9 g/dL   HCT 36.0 34.8 - 46.6 %   Platelets 443 (H) 145 - 400 10e3/uL   MCV 86.3 79.5 - 101.0 fL   MCH 28.0 25.1 - 34.0 pg   MCHC 32.5 31.5 - 36.0 g/dL   RBC 4.17 3.70 - 5.45 10e6/uL   RDW 14.3 11.2 - 14.5 %   lymph# 2.5 0.9 - 3.3 10e3/uL   MONO# 1.2 (H) 0.1 - 0.9 10e3/uL   Eosinophils Absolute 0.2 0.0 - 0.5 10e3/uL   Basophils Absolute 0.1 0.0 - 0.1 10e3/uL   NEUT% 56.3 38.4 - 76.8 %   LYMPH% 27.3 14.0 - 49.7 %   MONO% 12.9 0.0 - 14.0 %   EOS% 2.3 0.0 - 7.0 %   BASO% 1.2 0.0 - 2.0 %  Comprehensive metabolic panel (Cmet) - [facility]     Status: Abnormal   Collection Time: 01/29/15  3:13 PM  Result Value Ref Range   Sodium 135 (L) 136 - 145 mEq/L   Potassium 5.1 3.5 - 5.1 mEq/L   Chloride 97 (L) 98 - 109 mEq/L   CO2 26 22 - 29 mEq/L   Glucose 105 70 - 140 mg/dl   BUN 14.2 7.0 - 26.0 mg/dL   Creatinine 0.8 0.6 - 1.1 mg/dL   Total Bilirubin 0.33 0.20 - 1.20 mg/dL   Alkaline Phosphatase 101 40 - 150 U/L   AST 12 5 - 34 U/L   ALT 14 0 - 55 U/L   Total Protein 6.9 6.4 - 8.3 g/dL   Albumin 2.9 (L) 3.5 - 5.0 g/dL   Calcium  9.3 8.4 - 10.4 mg/dL   Anion Gap 11 3 - 11 mEq/L   EGFR 76 (L) >90 ml/min/1.73 m2    Comment: eGFR is calculated using the CKD-EPI Creatinine Equation (2009)      PATHOLOGY:   RADIOGRAPHY: US Paracentesis  01/29/2015   INDICATION: Recurrent malignant ascites, request for paracentesis.  EXAM: ULTRASOUND-GUIDED PARACENTESIS  COMPARISON:  01/04/15 paracentesis 4.2 liters.  MEDICATIONS: None.  COMPLICATIONS: None immediate  TECHNIQUE: Informed written consent was obtained from the patient after a discussion of the risks, benefits and alternatives to treatment. A timeout was performed prior to the initiation of the procedure.  Initial ultrasound scanning demonstrates a small amount of ascites within the right upper abdominal quadrant. The right upper abdomen was prepped and draped in the usual sterile fashion. 1% lidocaine was used for local anesthesia. Under direct ultrasound guidance, a 19 gauge, 7-cm, Yueh catheter was introduced. An ultrasound image was saved for documentation purposed. The paracentesis was performed. The  catheter was removed and a dressing was applied. The patient tolerated the procedure well without immediate post procedural complication.  FINDINGS: A total of approximately 1.5 liters of blood tinged fluid was removed.  IMPRESSION: Successful ultrasound-guided paracentesis yielding 1.5 liters of peritoneal fluid.  Read By:  Tsosie Billing PA-C   Electronically Signed   By: Jacqulynn Cadet M.D.   On: 01/29/2015 15:18       DISCUSSION:     IMPRESSION / PLAN:       Patient and accompanying individuals have had questions answered to their satisfaction and are in agreement with plan above. They can contact this office for questions or concerns at any time prior to next scheduled visit.  Time spent      , including >50% discussion and coordination of care.    Lianah Peed P, MD 01/29/2015 5:03 PM

## 2015-01-29 NOTE — Procedures (Signed)
Successful US guided paracentesis from RUQ.  Yielded 1.5 liters of blood tinged fluid.  No immediate complications.  Pt tolerated well.   Specimen was not sent for labs.  Tsosie Billing D PA-C 01/29/2015 3:16 PM

## 2015-01-29 NOTE — Patient Instructions (Signed)
We will send prescriptions to your pharmacy   1.decadron (dexamethasone, steroid) 4 mg. Take five tablets +(=20 mg) with food 12 hrs before taxol chemotherapy and five tablets with food 6 hrs before taxol. If you do well with the first taxol infusion, we will decrease the subsequent steroid doses to just 12 hrs prior to treatment  2.zofran (ondansetron) 8mg  One tablet every 8 hrs as needed for nausea. Will not make you drowsy. Fine to take one tablet AM after chemo whether or not any nausea then, to extend coverage for nausea a bit longer. Other than that dose, fine to take just as needed for nausea   3.ativan (lorazepam) 1 mg. 1/2 to 1  tablet swallow or dissolve under tongue every 6 hrs as needed for nausea. WIll make you drowsy and a little forgetful around each dose. Fine to take one tablet at bedtime night of chemo whether or not any nausea.    If taxol aches begin, start claritin 10 mg daily for next few days. Fine to use tylenol or your oxycodone if those help. Hot shower, heating pad or sometimes ice packs might help.  Stop aldactone/ spironolactone and stop lasix  Add Senokot S  Two tablets twice daily for constipation  You can call any time if needed [phone]. RN will call you next week to review these medicines, times etc.

## 2015-01-29 NOTE — Progress Notes (Signed)
Checked in new pt with no financial concerns. °

## 2015-01-29 NOTE — Progress Notes (Signed)
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   HISTORY OF PRESENT ILLNESS:Jessica Payne is a 64 y.o. 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 She is post R1 debulking of IIIC primary peritoneal/ at least IB serous endometrial carcinoma  01-12-15, with large volume malignant ascites. History is from this EMR, communication with gyn oncology and from patient/ husband now; I do not have [place] imaging studies, pathology information or hospital notes, and will request these.   Patient had not had regular medical care since she lost job and medical insurance. She had progressive abdominal swelling over ~ 6 months as well as some postmenopausal bleeding when she was admitted to [facility] in 11-2014 with abdominal pain and presumed peritonitis, initially thought to be either gall bladder related or cirrhosis. Initial paracentesis 12-11-2014 was for 3.5 liters, with malignant cytology consistent with gyn malignancy. Notes refer to CT and MRI  "which demonstrated omental thickening, extensive ascites, slight enlargement of the right adnexa and a thickened endometrium (1.1 cm)." She was referred to Jessica Payne, seen on 12-25-14 with endometrial biopsy not possible due to cervical stenosis. She had second paracentesis for 4.2 liters on 01-04-15. CA 125 on 01-07-15 was 1941 (pre op).She had TAH BSO, omentectomy and radical debulking by Jessica Payne on 01-12-15, with 4 liters of ascites at time of surgery. Findings at surgery were of milial tumor  over entire small bowel serosa, mesentery, diaphragm, plaque on bladder and involving cul de sac peritoneum. Surgery was optimal R1 cytoreduction. She was discharged home on 01-16-15 with lovenox, ~ 12 doses remaining. Pathology [phone]) demonstrated high grade serous carcinoma, IIIC primary peritoneal and at least IB endometrial. She has been recovering without complications from surgery. She saw Jessica Payne on 01-22-15, with recommendation for 6 cycles of carboplatin and taxol, then likely whole pelvic RT + vaginal brachytherapy if she has CR. Patient and husband attended chemotherapy education class prior to my visit today.  Patient again was uncomfortable from abdominal distension prior to visit today, with US paracentesis repeated this AM for 1.5 liters.   Patient was started on aldactone and lasix at presentation in Jan, which have been continued. She has no history of CHF, cirrhosis, ascites, peripheral edema prior. She has been taking both of the diuretics at hs. She is instructed to stop these now. She has had constipation since surgery, using some colace and tried miralax, more difficulty eating when constipated and with increased ascites, recalls eating "lots of hamburgers" after first paracentesis in Jan.    REVIEW OF SYSTEMS: No HA, glasses need change, no difficulty hearing, broken lower left molar. Usual good weight ~ 170, was 191 with first paracentesis. Constipated since surgery as above. Some nausea, no vomiting. No SOB, cough, chest pain. No palpitations or other cardiac symptoms. Increased GERD in past few weeks. No changes noted in breasts. Frequent urination with the diuretics, no dysuria. No bleeding, is administering own lovenox. No LE swelling. No peripheral neuropathy hands or feet. IV access has been easily accomplished. No significant arthritis symptoms. No difficulty staying off cigarettes  now"I had wanted to quit for a long time"  Remainder of full 10 point review of systems  negative.   ALLERGIES: Ciprofloxacin; Merthiolate; Penicillins; and Tramadol  PAST MEDICAL/ SURGICAL HISTORY:    G1 Partial thyroidectomy for goiter ~ 1980 Remote mammograms Tobacco 1 ppd x 20 years, DCd fall 2015  CURRENT MEDICATIONS: reviewed as listed now in EMR. Refill oxycodone 5 mg. Lovenox 12 injections remaining  Written and oral instructions as follows:  1.decadron (dexamethasone, steroid) 4 mg. Take five tablets +(=20 mg) with food 12 hrs before taxol chemotherapy and five tablets with food 6 hrs before taxol. If you do well with the first taxol infusion, we will decrease the subsequent steroid doses to just 12 hrs prior to treatment  2.zofran (ondansetron) 17m One tablet every 8 hrs as needed for nausea. Will not make you drowsy. Fine to take one tablet AM after chemo whether or not any nausea then, to extend coverage for nausea a bit longer. Other than that dose, fine to take just as needed for nausea   3.ativan (lorazepam) 1 mg. 1/2 to 1  tablet swallow or dissolve under tongue every 6 hrs as needed for nausea. WIll make you drowsy and a little forgetful around each dose. Fine to take one tablet at bedtime night of chemo whether or not any nausea.    If taxol aches begin, start claritin 10 mg daily for next few days. Fine to use tylenol or your oxycodone if those help. Hot shower, heating pad or sometimes ice packs might help.  Stop aldactone/ spironolactone and stop lasix  Add Senokot S  Two tablets twice daily for constipation     [facility] SOCIAL HISTORY:  [place]. Smoked 1 ppd x 20+ years, DCd fall 2015. 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 Her first cousin is Jessica Payne RTherapist, sports  FAMILY HISTORY:  Paternal grandmother lived to be 159No breast, ovarian, gyn cancer Sister and nephew with  thyroid disease Mother and Father both age 336 with pacemakers Son healthy Grandsons healthy         PHYSICAL EXAM:  height is 5' 7"  (1.702 m) and weight is 168 lb (76.204 kg). Her oral temperature is 98.4 F (36.9 C). Her blood pressure is 127/81 and her pulse is 73. Her respiration is 18 and oxygen saturation is 99%.  Alert, pleasant, cooperative lady looks stated age, looks mildly ill but not in acute discomfort, slowly ambulatory. Excellent historian.  HEENT:normal hair pattern, PERRL not icteric, oral mucosa moist and clear. No obvious oral infection, broken left lower tooth. No thyroid mass. No JVD. Neck supple  RESPIRATORY: BS clear thruout, no wheezes or rales, no dullness to percussion. Respirations not labored RA with activity in exam room.   CARDIAC/ VASCULAR: Heart RRR no gallop no murmur. Peripheral pulses intact and symmetrical  ABDOMEN: soft, not obviously distended just post paracentesis today, site of paracentesis no drainage or bleeding. Midline incision closed, with dark eschar ~ 3 cm length at mid incision. No erythema surrounding and no unusual tenderness. Bowel sounds diminished but present. Not tender epigastrium. Cannot appreciate HSM or mass.  LYMPH NODES: no cervical, supraclavicular, axillary or inguinal adenopathy BREASTS:bilaterally without dominant mass, skin or nipple findings.  NEUROLOGIC:speech fluent and appropriate, CN intact, moves all extremities equally, sensory and cerebellar not focal. PSYCH appropriate mood and  affect  SKIN: mild tenting, small resolving ecchymoses left flank sites of lovenox injections, no rash or petechiae  MUSCULOSKELETAL:no clubbing, cyanosis, edema. No cords or tenderness LE. Back not tender   LABORATORY DATA:  Results for orders placed or performed in visit on 01/29/15 (from the past 48 hour(s))  CBC with Differential     Status: Abnormal   Collection Time: 01/29/15  3:13 PM  Result Value Ref Range   WBC 9.3 3.9 -  10.3 10e3/uL   NEUT# 5.2 1.5 - 6.5 10e3/uL   HGB 11.7 11.6 - 15.9 g/dL   HCT 36.0 34.8 - 46.6 %   Platelets 443 (H) 145 - 400 10e3/uL   MCV 86.3 79.5 - 101.0 fL   MCH 28.0 25.1 - 34.0 pg   MCHC 32.5 31.5 - 36.0 g/dL   RBC 4.17 3.70 - 5.45 10e6/uL   RDW 14.3 11.2 - 14.5 %   lymph# 2.5 0.9 - 3.3 10e3/uL   MONO# 1.2 (H) 0.1 - 0.9 10e3/uL   Eosinophils Absolute 0.2 0.0 - 0.5 10e3/uL   Basophils Absolute 0.1 0.0 - 0.1 10e3/uL   NEUT% 56.3 38.4 - 76.8 %   LYMPH% 27.3 14.0 - 49.7 %   MONO% 12.9 0.0 - 14.0 %   EOS% 2.3 0.0 - 7.0 %   BASO% 1.2 0.0 - 2.0 %  Comprehensive metabolic panel (Cmet) - [facility]     Status: Abnormal   Collection Time: 01/29/15  3:13 PM  Result Value Ref Range   Sodium 135 (L) 136 - 145 mEq/L   Potassium 5.1 3.5 - 5.1 mEq/L   Chloride 97 (L) 98 - 109 mEq/L   CO2 26 22 - 29 mEq/L   Glucose 105 70 - 140 mg/dl   BUN 14.2 7.0 - 26.0 mg/dL   Creatinine 0.8 0.6 - 1.1 mg/dL   Total Bilirubin 0.33 0.20 - 1.20 mg/dL   Alkaline Phosphatase 101 40 - 150 U/L   AST 12 5 - 34 U/L   ALT 14 0 - 55 U/L   Total Protein 6.9 6.4 - 8.3 g/dL   Albumin 2.9 (L) 3.5 - 5.0 g/dL   Calcium 9.3 8.4 - 10.4 mg/dL   Anion Gap 11 3 - 11 mEq/L   EGFR 76 (L) >90 ml/min/1.73 m2    Comment: eGFR is calculated using the CKD-EPI Creatinine Equation (2009)    CA 125 available after visit 1191, likely reflecting recent surgery also. Prior was 1941 on 01-07-15 (pre op)   PATHOLOGY:   Harvell, Almyra Collected: 01/12/2015 Client: [facility] Accession: [id number] REASON FOR ADDENDUM, AMENDMENT OR CORRECTION: [id number]: Report of additional IHCs and oncology template. FINAL DIAGNOSIS Diagnosis 1. Uterus +/- tubes/ovaries, neoplastic - INVASIVE HIGH GRADE ADENOCARCINOMA ARISING IN AN ENDOMETRIAL POLYP, INVADING INTO THE OUTER HALF OF THE MYOMETRIUM. PLEASE SEE COMMENT. - UTERINE SEROSA: MULTIPLE TUMOR DEPOSITS OF HIGH GRADE SEROUS CARCINOMA WITH ASSOCIATED PSAMMOMA  BODIES. - CERVIX: BENIGN SQUAMOUS MUCOSA AND ENDOCERVICAL MUCOSA, NO DYSPLASIA OR MALIGNANCY. - RIGHT OVARY: BENIGN OVARIAN FIBROMA, NO EVIDENCE OF ATYPIA OR MALIGNANCY. - LEFT OVARY: POSITIVE FOR METASTATIC HIGH GRADE SEROUS CARCINOMA WITH ASSOCIATED PSAMMOMA BODIES. - BILATERAL FALLOPIAN TUBES: BENIGN FALLOPIAN TUBAL TISSUE, NO EVIDENCE OF ATYPIA OR MALIGNANCY. 2. Peritoneum, biopsy - POSITIVE FOR INVASIVE HIGH GRADE SEROUS CARCINOMA WITH ASSOCIATED PSAMMOMA BODIES (5.6 CM). 3. Omentum, resection for tumor - POSITIVE FOR INVASIVE HIGH GRADE SEROUS CARCINOMA WITH ASSOCIATED PSAMMOMA BODIES. Microscopic Comment 1. Additional immunostains will be performed and the oncology template(s) will be  reported after definitive diagnoses are rendered based on immunophenotypes of the tumors. ONCOLOGY TABLE-UTERUS, CARCINOMA Specimen: Uterus, cervix, bilateral ovaries and fallopian tubes. Procedure: Total hysterectomy and bilateral salpingo-oophorectomy Lymph node sampling performed: No Specimen integrity: Intact Maximum tumor size: 2.1 and 1.7 cm 1 of 4 Amended copy Addendum FINAL for Straker, Malasha ([id number]) Microscopic Comment(continued) Histologic type: Invasive serous carcinoma Grade: High grade Myometrial invasion: 1.1 cm where myometrium is 1.5 cm in thickness Cervical stromal involvement: no Extent of involvement of other organs: No Lymph - vascular invasion: Not identified Peritoneal washings: N/A Lymph nodes: # examined N/A ; # positive N/A Pelvic lymph nodes: N/A Para-aortic lymph nodes: N/A Other (specify involvement and site): N/A TNM code: pT1b, pNx FIGO Stage (based on pathologic findings, needs clinical correlation): I b Comment: There is an invasive high grade adenocarcinoma arising from an endometrial polyp. Immunostains were performed and the tumor cells are positive for PAX-8, weakly positive for ER and p16, negative for p53 and WT-1, with appropriate controls.  The morphologic and immunophenotypic features are different from those seen in the peritoneal tumor; thus this case is considered to be dual primaries.  RADIOGRAPHY: US Paracentesis  01/29/2015   INDICATION: Recurrent malignant ascites, request for paracentesis.  EXAM: ULTRASOUND-GUIDED PARACENTESIS  COMPARISON:  01/04/15 paracentesis 4.2 liters.  MEDICATIONS: None.  COMPLICATIONS: None immediate  TECHNIQUE: Informed written consent was obtained from the patient after a discussion of the risks, benefits and alternatives to treatment. A timeout was performed prior to the initiation of the procedure.  Initial ultrasound scanning demonstrates a small amount of ascites within the right upper abdominal quadrant. The right upper abdomen was prepped and draped in the usual sterile fashion. 1% lidocaine was used for local anesthesia. Under direct ultrasound guidance, a 19 gauge, 7-cm, Yueh catheter was introduced. An ultrasound image was saved for documentation purposed. The paracentesis was performed. The catheter was removed and a dressing was applied. The patient tolerated the procedure well without immediate post procedural complication.  FINDINGS: A total of approximately 1.5 liters of blood tinged fluid was removed.  IMPRESSION: Successful ultrasound-guided paracentesis yielding 1.5 liters of peritoneal fluid.  Read By:  Tsosie Billing PA-C   Electronically Signed   By: Jacqulynn Cadet M.D.   On: 01/29/2015 15:18    EXAM: ULTRASOUND-GUIDED THERAPEUTIC PARACENTESIS 01-04-15  Initial ultrasound scanning demonstrates a moderate to large amount of ascites within the left lower abdominal quadrant. The left lower abdomen was prepped and draped in the usual sterile fashion. 1% lidocaine was used for local anesthesia. Under direct ultrasound guidance, a 19 gauge, 10-cm, Yueh catheter was introduced. An ultrasound image was saved for documentation purposed. The paracentesis was performed. The catheter was  removed and a dressing was applied. The patient tolerated the procedure well without immediate post procedural complication.  FINDINGS: A total of approximately 4.2 liters of slightly turbid, yellow fluid was removed.  IMPRESSION: Successful ultrasound-guided therapeutic paracentesis yielding 4.2 liters of peritoneal fluid.    EXAM: CHEST 2 VIEW  01-07-15  COMPARISON: No priors.  FINDINGS: Lung volumes are normal. No consolidative airspace disease. No pleural effusions. No pneumothorax. No pulmonary nodule or mass noted. Pulmonary vasculature and the cardiomediastinal silhouette are within normal limits. Atherosclerotic calcifications in the thoracic aorta.  IMPRESSION: 1. No radiographic evidence of acute cardiopulmonary disease. 2. Atherosclerosis.    DISCUSSION: All of information in history reviewed, including circumstances surrounding presentation and diagnosis, findings at surgery and by pathology, and present condition. I have explained pathophysiology of malignant  ascites, which cannot be corrected by diuretics but may need intermittent paracenteses until hopefully chemotherapy improves situation. We have discussed rationale for chemotherapy with carboplatin and taxol, and discussed q 3 week vs weekly dosing. As patient is still very symptomatic and still recovering from surgery, I have recommended that she begin treatment with weekly dosing; she and husband are in agreement with this suggestion, and understand that we can change to q 3 week dosing if she prefers when she has otherwise improved. We have discussed chemotherapy side effects, including particularly nausea, premed decadron, taxol aches, peripheral neuropathy, drops in blood counts. They know how to contact this office at any time if concerns. We have discussed outpatient follow up during chemotherapy. Patient would like to begin treatment as soon as possible, hopefully next week. Due to late Friday  afternoon appointment today, we will be in touch with her early next week with scheduling, but treatment will not be on Monday. Verbal consent obtained.   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 Large volume ascites with at least 13 liters off since 11-2014. She is stable for chemotherapy to begin as soon as we are able to do that, hopefully next week. Begin with weekly dose dense regimen to allow improvement in PS and symptoms, then consider changing to q 3 week if preferable.  2.No indication that I can tell to continue aldactone 50 mg or lasix 20 mg daily, so she will stop these now 3.constipation related to disease and surgery: add senokot S 2 daily and increase laxatives further if needed, trying to keep bowels moving daily. 4.poor po intake related to ascites and constipation, tho appetite seems good when less symptomatic. Will ask [facility] dietician to evaluate.  5.elevated BP not known prior to this illness. Follow, but may well have been related to significant discomfort and anxiety 6.long tobacco DCd fall 2015. Encouraged her to stay off cigarettes 7.post partial thyroidectomy for goiter ~ 1980, on medication subsequently until ~ 1 year ago also related to lack of insurance then. She is not aware of TFTs checked at [facility], none in this EMR. Will add to next labs drawn. 8.broken lower tooth, uncomfortable but not acutely so, no dentist. Will refer to [facility] dental medicine if possible, but will not hold start of chemo for this. 9.GERD symptoms likely with other problems now, continue prevacid and follow.     Patient and husband have had questions answered to their satisfaction and are in agreement with plan above. They can contact this office for questions or concerns at any time  prior to next scheduled visit. Chemo orders entered. Message to financial staff re urgent start. Will request preauth for granix in case needed.  Time spent one hour, including >50% discussion and coordination of care.    Tykee Heideman P, MD 01/29/2015 5:03 PM

## 2015-01-29 NOTE — Telephone Encounter (Signed)
Reviewed note from Triage Nurse Raoul Pitch RN.  Pt. was scheduled for paracentesis yesterday but cancelled due to feeling less discomfort per Joylene John NP with [place]. Scheduled a Paracentesis at [facility] at 1330 today prior to Dr. Mariana Kaufman appointment at 1500.  Pt. verbalized understanding of appointment at [facility] at 1330.

## 2015-01-29 NOTE — Telephone Encounter (Signed)
Patient called to say she was having abdominal discomfort and was wondering if she might need a paracentesis.  She is a new patient for Dr. Marko Plume this afternoon.  Advised her to keep her appointment this afternoon and to go from there.  She agreed to do so.  She is recently post-op and her surgeon is Dr. Denman George.

## 2015-01-30 ENCOUNTER — Other Ambulatory Visit: Payer: Self-pay

## 2015-01-30 DIAGNOSIS — C55 Malignant neoplasm of uterus, part unspecified: Secondary | ICD-10-CM

## 2015-01-30 LAB — CA 125: CA 125: 5145 U/mL — ABNORMAL HIGH (ref <35)

## 2015-01-30 MED ORDER — ONDANSETRON HCL 8 MG PO TABS: 8.0000 mg | ORAL_TABLET | Freq: Three times a day (TID) | ORAL | Status: DC | PRN

## 2015-01-30 MED ORDER — DEXAMETHASONE 4 MG PO TABS: ORAL_TABLET | ORAL | Status: DC

## 2015-01-30 MED ORDER — LORAZEPAM 1 MG PO TABS: ORAL_TABLET | ORAL | Status: DC

## 2015-01-31 ENCOUNTER — Encounter: Payer: Self-pay | Admitting: Oncology

## 2015-01-31 ENCOUNTER — Other Ambulatory Visit: Payer: Self-pay | Admitting: Oncology

## 2015-01-31 DIAGNOSIS — E890 Postprocedural hypothyroidism: Secondary | ICD-10-CM | POA: Insufficient documentation

## 2015-01-31 DIAGNOSIS — K0381 Cracked tooth: Secondary | ICD-10-CM | POA: Insufficient documentation

## 2015-01-31 DIAGNOSIS — C482 Malignant neoplasm of peritoneum, unspecified: Secondary | ICD-10-CM

## 2015-01-31 DIAGNOSIS — K5904 Chronic idiopathic constipation: Secondary | ICD-10-CM | POA: Insufficient documentation

## 2015-01-31 DIAGNOSIS — Z9009 Acquired absence of other part of head and neck: Secondary | ICD-10-CM | POA: Insufficient documentation

## 2015-01-31 DIAGNOSIS — Z87891 Personal history of nicotine dependence: Secondary | ICD-10-CM | POA: Insufficient documentation

## 2015-02-01 ENCOUNTER — Telehealth: Payer: Self-pay | Admitting: Oncology

## 2015-02-01 NOTE — Telephone Encounter (Signed)
per urgent pof to sch pt w/nut-Dr kolinski-cld Farrel Gobble they recvd referral and Dr Lilli Few is reviewing they will contact pt after review-cld & spoke to pt and gave pt nut appt time & date

## 2015-02-02 ENCOUNTER — Telehealth: Payer: Self-pay | Admitting: *Deleted

## 2015-02-02 ENCOUNTER — Telehealth: Payer: Self-pay | Admitting: Oncology

## 2015-02-02 ENCOUNTER — Ambulatory Visit: Payer: 59 | Admitting: Nutrition

## 2015-02-02 NOTE — Telephone Encounter (Addendum)
Per Lattie Haw at Dr. Ritta Slot office they do not have any available appts to see patient this week. She recommends that we try to get patient an appt at [facility] which is across from [facility]. Contact # is [phone]. Unable to reach office for appt. RN will attempt again tomorrow.  Called and spoke with patient's husband as her cell phone is currently out of battery. He was out running errands but agreeable to tell Mrs. Tomb that she does not have to have the dentist appt prior to first chemo. I told Mr. Tobey we will call her back tomorrow with information regarding an upcoming dentist appt.

## 2015-02-02 NOTE — Telephone Encounter (Signed)
Per staff message and POF I have scheduled appts. Advised scheduler of appts. JMW  

## 2015-02-02 NOTE — Telephone Encounter (Signed)
Called patient to let her know 3 prescriptions have been sent to her pharmacy to pickup prior to starting chemo on 3-17. Reviewed appts with patient for 3-17, 3-21, and 3-24 - patient wrote down times and also wrote down decadron instructions. She is agreeable to take 5 tablets with food at 10pm and 4am prior to chemo.   Patient states she is no longer having issues with constipation - she takes sennakot in the morning and colace at bedtime. Asked patient if she feels like she needs another paracentesis - she states she does not. Denies any SOB or increased in abdominal swelling/fullness. Patient states she is trying to drink more water since last MD visit but is not drinking as much as she should be drinking. Recommended to patient that she try using a straw as often times that will allow patient's to get more fluids down than sipping on water. Patient agreeable to try this.  Told patient to call us back if she any quesitons or concerns prior to first chemo on 3-17.

## 2015-02-02 NOTE — Telephone Encounter (Signed)
appts made per kristin and per kristin she will call atient with appts as she has other things to speak with her about  anne

## 2015-02-02 NOTE — Telephone Encounter (Signed)
-----   Message from Gordy Levan, MD sent at 02/02/2015  3:49 PM EDT ----- Regarding: RE: Dentist appt prior to chemo Does not need dentist before first chemo, so ok for 3-17 treatment Thanks! ----- Message -----    From: Christa See, RN    Sent: 02/02/2015   2:29 PM      To: Gordy Levan, MD Subject: Dentist appt prior to chemo                    You sent dentist referral on 3/13. As of today, no appt has been made. I left a voicemail for Dr. Ok Anis office to schedule this.  If for any reason patient cannot get in prior to first chemo on 3/17 do we need to reschedule chemo to after dentist appt?  Thanks! Cyril Mourning

## 2015-02-02 NOTE — Progress Notes (Signed)
Patient is a 64 year old female diagnosed with peritoneal carcinomatosis.  She is a patient of Dr. Marko Plume.  Past medical history includes malignant ascites, hypertension, hypothyroidism, hyperthyroidism, GERD, headache, and tobacco usage.  Medications include Decadron, Pepcid, Lasix, Ativan, Zofran, MiraLAX, and Senokot-S.  Labs include sodium 135 and albumin 2.9 on March 11.  Height: 67 inches. Weight: 171.6 pounds Usual body weight: 170 pounds. BMI: 26.88.  Patient is status post paracentesis and surgery. Patient had expected decreased oral intake with ascites. Patient reports constipation also decreased oral intake and appetite. Patient is taking medication to improve constipation. Patient has noted muscle loss on physical exam. Current oral intake appears improved.  Patient declines oral nutrition supplements at this time.  She prefers to try to eat foods that are nutrient dense.  Nutrition diagnosis:  Food and nutrition related knowledge deficit related to new diagnosis of peritoneal carcinomatosis as evidenced by no prior need for nutrition related information.  Intervention:  Educated patient to consume small frequent meals with adequate calories and high-protein foods to promote maintenance of lean body mass. Educated patient on strategies for improving constipation. Educated patient on increasing hydration. Reviewed strategies for eating if patient develops nausea and vomiting with chemotherapy. If patient cannot eat adequate calories and protein at meals and snacks, will add oral nutrition supplements. Fact sheets were provided.  Questions were answered.  Contact information given.  Teach back method used.  Monitoring, evaluation, goals: Patient will tolerate adequate calories and protein to maintain lean body mass and reduce nutrition impact symptoms.  Next visit: I will follow-up with patient during chemotherapy.  Thank you**Disclaimer: This note was dictated with  voice recognition software. Similar sounding words can inadvertently be transcribed and this note may contain transcription errors which may not have been corrected upon publication of note.**

## 2015-02-03 NOTE — Telephone Encounter (Addendum)
Spoke with Lattie Haw with Dr. Ritta Slot office.  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  It. Is  day to day as to weather or not he will have duty.  There sre other surgeries and referrals that he needs to work around this jury duty.  Our office can call next week if needed but it would be a wait and see for appointment.  Lattie Haw said that if patient does not have dental insurance that she would be responsible for the payment of services as she would be in any other practice. Reviewed this information with patient.  She is not sure if she has [employer] fashion/clothing.  She will check. Told Ms. Zukas that an appointment was made for her at [facility] on Tuesday 02-09-15 at 1100. She needs to arrive at 1045. She can call the office if she needs to cancel as she has limited funds.  O: [phone]. They are located at [address].

## 2015-02-04 ENCOUNTER — Ambulatory Visit (HOSPITAL_BASED_OUTPATIENT_CLINIC_OR_DEPARTMENT_OTHER): Payer: 59

## 2015-02-04 ENCOUNTER — Ambulatory Visit: Payer: Self-pay | Admitting: Internal Medicine

## 2015-02-04 ENCOUNTER — Other Ambulatory Visit (HOSPITAL_BASED_OUTPATIENT_CLINIC_OR_DEPARTMENT_OTHER): Payer: 59

## 2015-02-04 DIAGNOSIS — C541 Malignant neoplasm of endometrium: Secondary | ICD-10-CM

## 2015-02-04 DIAGNOSIS — Z5111 Encounter for antineoplastic chemotherapy: Secondary | ICD-10-CM

## 2015-02-04 DIAGNOSIS — C482 Malignant neoplasm of peritoneum, unspecified: Secondary | ICD-10-CM

## 2015-02-04 DIAGNOSIS — Z9009 Acquired absence of other part of head and neck: Secondary | ICD-10-CM

## 2015-02-04 DIAGNOSIS — Z9889 Other specified postprocedural states: Secondary | ICD-10-CM

## 2015-02-04 DIAGNOSIS — E890 Postprocedural hypothyroidism: Secondary | ICD-10-CM

## 2015-02-04 LAB — CBC WITH DIFFERENTIAL/PLATELET
BASO%: 1.3 % (ref 0.0–2.0)
BASOS ABS: 0.1 10*3/uL (ref 0.0–0.1)
EOS ABS: 0 10*3/uL (ref 0.0–0.5)
EOS%: 0 % (ref 0.0–7.0)
HCT: 36 % (ref 34.8–46.6)
HEMOGLOBIN: 11.8 g/dL (ref 11.6–15.9)
LYMPH#: 0.7 10*3/uL — AB (ref 0.9–3.3)
LYMPH%: 10 % — ABNORMAL LOW (ref 14.0–49.7)
MCH: 27.9 pg (ref 25.1–34.0)
MCHC: 32.8 g/dL (ref 31.5–36.0)
MCV: 85.1 fL (ref 79.5–101.0)
MONO#: 0.1 10*3/uL (ref 0.1–0.9)
MONO%: 1.3 % (ref 0.0–14.0)
NEUT%: 87.4 % — AB (ref 38.4–76.8)
NEUTROS ABS: 6.4 10*3/uL (ref 1.5–6.5)
PLATELETS: 399 10*3/uL (ref 145–400)
RBC: 4.22 10*6/uL (ref 3.70–5.45)
RDW: 14.3 % (ref 11.2–14.5)
WBC: 7.3 10*3/uL (ref 3.9–10.3)

## 2015-02-04 LAB — COMPREHENSIVE METABOLIC PANEL (CC13)
ALK PHOS: 104 U/L (ref 40–150)
ALT: 12 U/L (ref 0–55)
ANION GAP: 13 meq/L — AB (ref 3–11)
AST: 10 U/L (ref 5–34)
Albumin: 2.8 g/dL — ABNORMAL LOW (ref 3.5–5.0)
BUN: 16 mg/dL (ref 7.0–26.0)
CALCIUM: 9.2 mg/dL (ref 8.4–10.4)
CO2: 23 mEq/L (ref 22–29)
Chloride: 101 mEq/L (ref 98–109)
Creatinine: 0.7 mg/dL (ref 0.6–1.1)
EGFR: 87 mL/min/{1.73_m2} — AB (ref >90)
GLUCOSE: 163 mg/dL — AB (ref 70–140)
POTASSIUM: 3.8 meq/L (ref 3.5–5.1)
SODIUM: 136 meq/L (ref 136–145)
Total Bilirubin: 0.21 mg/dL (ref 0.20–1.20)
Total Protein: 6.9 g/dL (ref 6.4–8.3)

## 2015-02-04 LAB — TSH CHCC

## 2015-02-04 MED ORDER — SODIUM CHLORIDE 0.9 % IV SOLN
682.8000 mg | Freq: Once | INTRAVENOUS | Status: AC
  Administered 2015-02-04: 680 mg via INTRAVENOUS
  Filled 2015-02-04: qty 68

## 2015-02-04 MED ORDER — FAMOTIDINE IN NACL 20-0.9 MG/50ML-% IV SOLN
INTRAVENOUS | Status: AC
  Filled 2015-02-04: qty 50

## 2015-02-04 MED ORDER — DIPHENHYDRAMINE HCL 50 MG/ML IJ SOLN
INTRAMUSCULAR | Status: AC
  Filled 2015-02-04: qty 1

## 2015-02-04 MED ORDER — SODIUM CHLORIDE 0.9 % IV SOLN
Freq: Once | INTRAVENOUS | Status: AC
  Administered 2015-02-04: 10:00:00 via INTRAVENOUS

## 2015-02-04 MED ORDER — SODIUM CHLORIDE 0.9 % IV SOLN
Freq: Once | INTRAVENOUS | Status: AC
  Administered 2015-02-04: 10:00:00 via INTRAVENOUS
  Filled 2015-02-04: qty 8

## 2015-02-04 MED ORDER — PACLITAXEL CHEMO INJECTION 300 MG/50ML
80.0000 mg/m2 | Freq: Once | INTRAVENOUS | Status: AC
  Administered 2015-02-04: 150 mg via INTRAVENOUS
  Filled 2015-02-04: qty 25

## 2015-02-04 MED ORDER — DIPHENHYDRAMINE HCL 50 MG/ML IJ SOLN
50.0000 mg | Freq: Once | INTRAMUSCULAR | Status: AC
  Administered 2015-02-04: 50 mg via INTRAVENOUS

## 2015-02-04 MED ORDER — FAMOTIDINE IN NACL 20-0.9 MG/50ML-% IV SOLN
20.0000 mg | Freq: Once | INTRAVENOUS | Status: AC
  Administered 2015-02-04: 20 mg via INTRAVENOUS

## 2015-02-04 NOTE — Patient Instructions (Signed)
[facility] Discharge Instructions for Patients Receiving Chemotherapy  Today you received the following chemotherapy agents taxol/carboplatin  To help prevent nausea and vomiting after your treatment, we encourage you to take your nausea medication as directed   If you develop nausea and vomiting that is not controlled by your nausea medication, call the clinic.   BELOW ARE SYMPTOMS THAT SHOULD BE REPORTED IMMEDIATELY:  *FEVER GREATER THAN 100.5 F  *CHILLS WITH OR WITHOUT FEVER  NAUSEA AND VOMITING THAT IS NOT CONTROLLED WITH YOUR NAUSEA MEDICATION  *UNUSUAL SHORTNESS OF BREATH  *UNUSUAL BRUISING OR BLEEDING  TENDERNESS IN MOUTH AND THROAT WITH OR WITHOUT PRESENCE OF ULCERS  *URINARY PROBLEMS  *BOWEL PROBLEMS  UNUSUAL RASH Items with * indicate a potential emergency and should be followed up as soon as possible.  Feel free to call the clinic you have any questions or concerns. The clinic phone number is [phone].   Paclitaxel injection What is this medicine? PACLITAXEL (PAK li TAX el) is a chemotherapy drug. It targets fast dividing cells, like cancer cells, and causes these cells to die. This medicine is used to treat ovarian cancer, breast cancer, and other cancers. This medicine may be used for other purposes; ask your health care provider or pharmacist if you have questions. COMMON BRAND NAME(S): Onxol, Taxol What should I tell my health care provider before I take this medicine? They need to know if you have any of these conditions: -blood disorders -irregular heartbeat -infection (especially a virus infection such as chickenpox, cold sores, or herpes) -liver disease -previous or ongoing radiation therapy -an unusual or allergic reaction to paclitaxel, alcohol, polyoxyethylated castor oil, other chemotherapy agents, other medicines, foods, dyes, or preservatives -pregnant or trying to get pregnant -breast-feeding How should I use this  medicine? This drug is given as an infusion into a vein. It is administered in a hospital or clinic by a specially trained health care professional. Talk to your pediatrician regarding the use of this medicine in children. Special care may be needed. Overdosage: If you think you have taken too much of this medicine contact a poison control center or emergency room at once. NOTE: This medicine is only for you. Do not share this medicine with others. What if I miss a dose? It is important not to miss your dose. Call your doctor or health care professional if you are unable to keep an appointment. What may interact with this medicine? Do not take this medicine with any of the following medications: -disulfiram -metronidazole This medicine may also interact with the following medications: -cyclosporine -diazepam -ketoconazole -medicines to increase blood counts like filgrastim, pegfilgrastim, sargramostim -other chemotherapy drugs like cisplatin, doxorubicin, epirubicin, etoposide, teniposide, vincristine -quinidine -testosterone -vaccines -verapamil Talk to your doctor or health care professional before taking any of these medicines: -acetaminophen -aspirin -ibuprofen -ketoprofen -naproxen This list may not describe all possible interactions. Give your health care provider a list of all the medicines, herbs, non-prescription drugs, or dietary supplements you use. Also tell them if you smoke, drink alcohol, or use illegal drugs. Some items may interact with your medicine. What should I watch for while using this medicine? Your condition will be monitored carefully while you are receiving this medicine. You will need important blood work done while you are taking this medicine. This drug may make you feel generally unwell. This is not uncommon, as chemotherapy can affect healthy cells as well as cancer cells. Report any side effects. Continue your course of treatment   even though you feel ill  unless your doctor tells you to stop. In some cases, you may be given additional medicines to help with side effects. Follow all directions for their use. Call your doctor or health care professional for advice if you get a fever, chills or sore throat, or other symptoms of a cold or flu. Do not treat yourself. This drug decreases your body's ability to fight infections. Try to avoid being around people who are sick. This medicine may increase your risk to bruise or bleed. Call your doctor or health care professional if you notice any unusual bleeding. Be careful brushing and flossing your teeth or using a toothpick because you may get an infection or bleed more easily. If you have any dental work done, tell your dentist you are receiving this medicine. Avoid taking products that contain aspirin, acetaminophen, ibuprofen, naproxen, or ketoprofen unless instructed by your doctor. These medicines may hide a fever. Do not become pregnant while taking this medicine. Women should inform their doctor if they wish to become pregnant or think they might be pregnant. There is a potential for serious side effects to an unborn child. Talk to your health care professional or pharmacist for more information. Do not breast-feed an infant while taking this medicine. Men are advised not to father a child while receiving this medicine. What side effects may I notice from receiving this medicine? Side effects that you should report to your doctor or health care professional as soon as possible: -allergic reactions like skin rash, itching or hives, swelling of the face, lips, or tongue -low blood counts - This drug may decrease the number of white blood cells, red blood cells and platelets. You may be at increased risk for infections and bleeding. -signs of infection - fever or chills, cough, sore throat, pain or difficulty passing urine -signs of decreased platelets or bleeding - bruising, pinpoint red spots on the skin,  black, tarry stools, nosebleeds -signs of decreased red blood cells - unusually weak or tired, fainting spells, lightheadedness -breathing problems -chest pain -high or low blood pressure -mouth sores -nausea and vomiting -pain, swelling, redness or irritation at the injection site -pain, tingling, numbness in the hands or feet -slow or irregular heartbeat -swelling of the ankle, feet, hands Side effects that usually do not require medical attention (report to your doctor or health care professional if they continue or are bothersome): -bone pain -complete hair loss including hair on your head, underarms, pubic hair, eyebrows, and eyelashes -changes in the color of fingernails -diarrhea -loosening of the fingernails -loss of appetite -muscle or joint pain -red flush to skin -sweating This list may not describe all possible side effects. Call your doctor for medical advice about side effects. You may report side effects to FDA at 1-800-FDA-1088. Where should I keep my medicine? This drug is given in a hospital or clinic and will not be stored at home. NOTE: This sheet is a summary. It may not cover all possible information. If you have questions about this medicine, talk to your doctor, pharmacist, or health care provider.  2015, Elsevier/Gold Standard. (2012-12-30 16:41:21)  Carboplatin injection What is this medicine? CARBOPLATIN (KAR boe pla tin) is a chemotherapy drug. It targets fast dividing cells, like cancer cells, and causes these cells to die. This medicine is used to treat ovarian cancer and many other cancers. This medicine may be used for other purposes; ask your health care provider or pharmacist if you have questions. COMMON BRAND   NAME(S): Paraplatin What should I tell my health care provider before I take this medicine? They need to know if you have any of these conditions: -blood disorders -hearing problems -kidney disease -recent or ongoing radiation  therapy -an unusual or allergic reaction to carboplatin, cisplatin, other chemotherapy, other medicines, foods, dyes, or preservatives -pregnant or trying to get pregnant -breast-feeding How should I use this medicine? This drug is usually given as an infusion into a vein. It is administered in a hospital or clinic by a specially trained health care professional. Talk to your pediatrician regarding the use of this medicine in children. Special care may be needed. Overdosage: If you think you have taken too much of this medicine contact a poison control center or emergency room at once. NOTE: This medicine is only for you. Do not share this medicine with others. What if I miss a dose? It is important not to miss a dose. Call your doctor or health care professional if you are unable to keep an appointment. What may interact with this medicine? -medicines for seizures -medicines to increase blood counts like filgrastim, pegfilgrastim, sargramostim -some antibiotics like amikacin, gentamicin, neomycin, streptomycin, tobramycin -vaccines Talk to your doctor or health care professional before taking any of these medicines: -acetaminophen -aspirin -ibuprofen -ketoprofen -naproxen This list may not describe all possible interactions. Give your health care provider a list of all the medicines, herbs, non-prescription drugs, or dietary supplements you use. Also tell them if you smoke, drink alcohol, or use illegal drugs. Some items may interact with your medicine. What should I watch for while using this medicine? Your condition will be monitored carefully while you are receiving this medicine. You will need important blood work done while you are taking this medicine. This drug may make you feel generally unwell. This is not uncommon, as chemotherapy can affect healthy cells as well as cancer cells. Report any side effects. Continue your course of treatment even though you feel ill unless your doctor  tells you to stop. In some cases, you may be given additional medicines to help with side effects. Follow all directions for their use. Call your doctor or health care professional for advice if you get a fever, chills or sore throat, or other symptoms of a cold or flu. Do not treat yourself. This drug decreases your body's ability to fight infections. Try to avoid being around people who are sick. This medicine may increase your risk to bruise or bleed. Call your doctor or health care professional if you notice any unusual bleeding. Be careful brushing and flossing your teeth or using a toothpick because you may get an infection or bleed more easily. If you have any dental work done, tell your dentist you are receiving this medicine. Avoid taking products that contain aspirin, acetaminophen, ibuprofen, naproxen, or ketoprofen unless instructed by your doctor. These medicines may hide a fever. Do not become pregnant while taking this medicine. Women should inform their doctor if they wish to become pregnant or think they might be pregnant. There is a potential for serious side effects to an unborn child. Talk to your health care professional or pharmacist for more information. Do not breast-feed an infant while taking this medicine. What side effects may I notice from receiving this medicine? Side effects that you should report to your doctor or health care professional as soon as possible: -allergic reactions like skin rash, itching or hives, swelling of the face, lips, or tongue -signs of infection - fever or   chills, cough, sore throat, pain or difficulty passing urine -signs of decreased platelets or bleeding - bruising, pinpoint red spots on the skin, black, tarry stools, nosebleeds -signs of decreased red blood cells - unusually weak or tired, fainting spells, lightheadedness -breathing problems -changes in hearing -changes in vision -chest pain -high blood pressure -low blood counts - This  drug may decrease the number of white blood cells, red blood cells and platelets. You may be at increased risk for infections and bleeding. -nausea and vomiting -pain, swelling, redness or irritation at the injection site -pain, tingling, numbness in the hands or feet -problems with balance, talking, walking -trouble passing urine or change in the amount of urine Side effects that usually do not require medical attention (report to your doctor or health care professional if they continue or are bothersome): -hair loss -loss of appetite -metallic taste in the mouth or changes in taste This list may not describe all possible side effects. Call your doctor for medical advice about side effects. You may report side effects to FDA at 1-800-FDA-1088. Where should I keep my medicine? This drug is given in a hospital or clinic and will not be stored at home. NOTE: This sheet is a summary. It may not cover all possible information. If you have questions about this medicine, talk to your doctor, pharmacist, or health care provider.  2015, Elsevier/Gold Standard. (2008-02-11 14:38:05)   

## 2015-02-05 ENCOUNTER — Other Ambulatory Visit: Payer: Self-pay | Admitting: *Deleted

## 2015-02-05 ENCOUNTER — Telehealth: Payer: Self-pay

## 2015-02-05 DIAGNOSIS — C55 Malignant neoplasm of uterus, part unspecified: Secondary | ICD-10-CM

## 2015-02-05 NOTE — Telephone Encounter (Signed)
Left message for Jessica Payne to call back to the [facility] to give up date as to how she is doing after her first treatment yesterday.

## 2015-02-05 NOTE — Telephone Encounter (Signed)
-----   Message from Arty Baumgartner, RN sent at 02/04/2015 11:23 AM EDT ----- Regarding: First time chemo. [place]  First time taxol/carboplatin.  Dr Marko Plume  [phone]

## 2015-02-07 ENCOUNTER — Other Ambulatory Visit: Payer: Self-pay | Admitting: Oncology

## 2015-02-08 ENCOUNTER — Ambulatory Visit (HOSPITAL_BASED_OUTPATIENT_CLINIC_OR_DEPARTMENT_OTHER): Payer: 59 | Admitting: Oncology

## 2015-02-08 ENCOUNTER — Encounter: Payer: Self-pay | Admitting: Oncology

## 2015-02-08 ENCOUNTER — Other Ambulatory Visit (HOSPITAL_BASED_OUTPATIENT_CLINIC_OR_DEPARTMENT_OTHER): Payer: 59

## 2015-02-08 VITALS — BP 129/66 | HR 86 | Temp 97.4°F | Resp 18 | Ht 67.0 in | Wt 173.3 lb

## 2015-02-08 DIAGNOSIS — K219 Gastro-esophageal reflux disease without esophagitis: Secondary | ICD-10-CM

## 2015-02-08 DIAGNOSIS — E890 Postprocedural hypothyroidism: Secondary | ICD-10-CM

## 2015-02-08 DIAGNOSIS — C482 Malignant neoplasm of peritoneum, unspecified: Secondary | ICD-10-CM

## 2015-02-08 DIAGNOSIS — R350 Frequency of micturition: Secondary | ICD-10-CM

## 2015-02-08 DIAGNOSIS — C55 Malignant neoplasm of uterus, part unspecified: Secondary | ICD-10-CM | POA: Diagnosis not present

## 2015-02-08 DIAGNOSIS — E049 Nontoxic goiter, unspecified: Secondary | ICD-10-CM

## 2015-02-08 DIAGNOSIS — K0381 Cracked tooth: Secondary | ICD-10-CM

## 2015-02-08 DIAGNOSIS — Z9009 Acquired absence of other part of head and neck: Secondary | ICD-10-CM

## 2015-02-08 DIAGNOSIS — R180 Malignant ascites: Secondary | ICD-10-CM | POA: Diagnosis not present

## 2015-02-08 DIAGNOSIS — K5909 Other constipation: Secondary | ICD-10-CM

## 2015-02-08 DIAGNOSIS — K5904 Chronic idiopathic constipation: Secondary | ICD-10-CM

## 2015-02-08 DIAGNOSIS — T451X5A Adverse effect of antineoplastic and immunosuppressive drugs, initial encounter: Secondary | ICD-10-CM

## 2015-02-08 DIAGNOSIS — R110 Nausea: Secondary | ICD-10-CM

## 2015-02-08 LAB — CBC WITH DIFFERENTIAL/PLATELET
BASO%: 0.2 % (ref 0.0–2.0)
Basophils Absolute: 0 10*3/uL (ref 0.0–0.1)
EOS%: 3.7 % (ref 0.0–7.0)
Eosinophils Absolute: 0.2 10*3/uL (ref 0.0–0.5)
HCT: 35.9 % (ref 34.8–46.6)
HGB: 12.1 g/dL (ref 11.6–15.9)
LYMPH#: 1.9 10*3/uL (ref 0.9–3.3)
LYMPH%: 34.2 % (ref 14.0–49.7)
MCH: 28.9 pg (ref 25.1–34.0)
MCHC: 33.7 g/dL (ref 31.5–36.0)
MCV: 85.7 fL (ref 79.5–101.0)
MONO#: 0.2 10*3/uL (ref 0.1–0.9)
MONO%: 3.7 % (ref 0.0–14.0)
NEUT#: 3.2 10*3/uL (ref 1.5–6.5)
NEUT%: 58.2 % (ref 38.4–76.8)
Platelets: 287 10*3/uL (ref 145–400)
RBC: 4.19 10*6/uL (ref 3.70–5.45)
RDW: 13.3 % (ref 11.2–14.5)
WBC: 5.4 10*3/uL (ref 3.9–10.3)

## 2015-02-08 LAB — COMPREHENSIVE METABOLIC PANEL (CC13)
ALBUMIN: 2.9 g/dL — AB (ref 3.5–5.0)
ALT: 20 U/L (ref 0–55)
AST: 19 U/L (ref 5–34)
Alkaline Phosphatase: 82 U/L (ref 40–150)
Anion Gap: 11 mEq/L (ref 3–11)
BUN: 11.2 mg/dL (ref 7.0–26.0)
CHLORIDE: 96 meq/L — AB (ref 98–109)
CO2: 28 mEq/L (ref 22–29)
CREATININE: 0.8 mg/dL (ref 0.6–1.1)
Calcium: 9 mg/dL (ref 8.4–10.4)
EGFR: 84 mL/min/{1.73_m2} — AB (ref >90)
Glucose: 105 mg/dl (ref 70–140)
Potassium: 3.9 mEq/L (ref 3.5–5.1)
Sodium: 135 mEq/L — ABNORMAL LOW (ref 136–145)
Total Bilirubin: 0.55 mg/dL (ref 0.20–1.20)
Total Protein: 6.4 g/dL (ref 6.4–8.3)

## 2015-02-08 LAB — TSH CHCC: TSH: <0.08 m(IU)/L — ABNORMAL LOW (ref 0.308–3.960)

## 2015-02-08 MED ORDER — PROMETHAZINE HCL 25 MG RE SUPP: 25.0000 mg | Freq: Four times a day (QID) | RECTAL | Status: DC | PRN

## 2015-02-08 MED ORDER — PROMETHAZINE HCL 25 MG RE SUPP
25.0000 mg | Freq: Four times a day (QID) | RECTAL | Status: AC | PRN
Start: 2015-02-08 — End: ?

## 2015-02-08 NOTE — Progress Notes (Signed)
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 She had optimal R1 cytoreduction by Dr Denman George on 01-12-15.  Patient tolerated the first chemotherapy treatment on 02-04-15 with no acute problems. She has had nausea today (day 5) for the first time, but has also had some constipation despite senna and stool softener bid, and still GERD despite OTC pepcid once daily. She has not eaten by time of visit today, but prior to this has been able to eat and to drink fluids adequately. She took zofran this AM, which has not resolved nausea. She stopped lasix and aldactone after visit on 01-29-15; she is still voiding frequently tho no frank dysuria. She does not feel that ascites has reaccumulated, last paracentesis for 1.5 litlers on 01-29-15. No significant taxol aches.  Patient met Dr Philip Aspen, new PCP. He requests TSH and free T4 with next lab draw here. She is to be seen by dentist on 3-22 for broken tooth.  No PAC. Peripheral IV access easily accomplished.   ONCOLOGIC HISTORY Patient had not had regular medical care since she lost job and medical insurance. She had progressive abdominal swelling over ~ 6 months as well as some postmenopausal bleeding when she was admitted to [facility] in 11-2014 with abdominal pain and presumed peritonitis, initially thought to be either gall bladder related or cirrhosis. Initial paracentesis 12-11-2014 was for 3.5 liters, with malignant cytology consistent with gyn malignancy. Notes refer to CT and MRI "which demonstrated omental thickening, extensive ascites, slight enlargement of the right adnexa and a thickened endometrium (1.1 cm)."  She was referred to Dr Josephina Shih, seen on 12-25-14 with endometrial biopsy not possible due to cervical stenosis. She had second paracentesis for 4.2 liters on 01-04-15. CA 125 on 01-07-15 was 1941 (pre op).She had TAH BSO, omentectomy and radical debulking by Dr Denman George on 01-12-15, with 4 liters of ascites at time of surgery. Findings at surgery were of milial tumor over entire small bowel serosa, mesentery, diaphragm, plaque on bladder and involving cul de sac peritoneum. Surgery was optimal R1 cytoreduction. She was discharged home on 01-16-15 with lovenox, ~ 12 doses remaining. Pathology [phone]) demonstrated high grade serous carcinoma, IIIC primary peritoneal and at least IB endometrial. She has been recovering without complications from surgery. She saw Dr Denman George on 01-22-15, with recommendation for 6 cycles of carboplatin and taxol, then likely whole pelvic RT + vaginal brachytherapy if she has CR. US paracentesis 01-29-15 for 1.5 liters.   Review of systems as above, also No fever. No SOB or other respiratory symptoms. No peripheral neuropathy. No problems at venous access site. No LE swelling. No bleeding. Remainder of 10 point Review of Systems negative.  Objective:  Vital signs in last 24 hours:  BP 129/66 mmHg  Pulse 86  Temp(Src) 97.4 F (36.3 C) (Oral)  Resp 18  Ht 5' 7"  (1.702 m)  Wt 173 lb 4.8 oz (78.608 kg)  BMI 27.14 kg/m2 Weight up 2 lbs. Alert, oriented and appropriate. Ambulatory without difficulty, holding emesis bag. Very pleasant and cooperative. No alopecia  HEENT:PERRL, sclerae not icteric. Oral mucosa moist without lesions, posterior pharynx clear.  Neck supple. No JVD.  Lymphatics:no cervical,supraclavicular or inguinal adenopathy Resp: clear  to auscultation bilaterally and normal percussion bilaterally Cardio: regular rate and rhythm. No gallop. GI: soft, nontender, not distended, no mass or organomegaly. Decreased bowel sounds. Surgical incision not remarkable  with ~ 2 cm eschar at mid incision, no erythema, otherwise closed. Musculoskeletal/ Extremities: without pitting edema, cords, tenderness Neuro: no peripheral neuropathy. Otherwise nonfocal. PSYCH appropriate mood and affect Skin without rash, ecchymosis, petechiae   Lab Results:  Results for orders placed or performed in visit on 02/08/15  CBC with Differential  Result Value Ref Range   WBC 5.4 3.9 - 10.3 10e3/uL   NEUT# 3.2 1.5 - 6.5 10e3/uL   HGB 12.1 11.6 - 15.9 g/dL   HCT 35.9 34.8 - 46.6 %   Platelets 287 145 - 400 10e3/uL   MCV 85.7 79.5 - 101.0 fL   MCH 28.9 25.1 - 34.0 pg   MCHC 33.7 31.5 - 36.0 g/dL   RBC 4.19 3.70 - 5.45 10e6/uL   RDW 13.3 11.2 - 14.5 %   lymph# 1.9 0.9 - 3.3 10e3/uL   MONO# 0.2 0.1 - 0.9 10e3/uL   Eosinophils Absolute 0.2 0.0 - 0.5 10e3/uL   Basophils Absolute 0.0 0.0 - 0.1 10e3/uL   NEUT% 58.2 38.4 - 76.8 %   LYMPH% 34.2 14.0 - 49.7 %   MONO% 3.7 0.0 - 14.0 %   EOS% 3.7 0.0 - 7.0 %   BASO% 0.2 0.0 - 2.0 %  Comprehensive metabolic panel (Cmet) - [facility]  Result Value Ref Range   Sodium 135 (L) 136 - 145 mEq/L   Potassium 3.9 3.5 - 5.1 mEq/L   Chloride 96 (L) 98 - 109 mEq/L   CO2 28 22 - 29 mEq/L   Glucose 105 70 - 140 mg/dl   BUN 11.2 7.0 - 26.0 mg/dL   Creatinine 0.8 0.6 - 1.1 mg/dL   Total Bilirubin 0.55 0.20 - 1.20 mg/dL   Alkaline Phosphatase 82 40 - 150 U/L   AST 19 5 - 34 U/L   ALT 20 0 - 55 U/L   Total Protein 6.4 6.4 - 8.3 g/dL   Albumin 2.9 (L) 3.5 - 5.0 g/dL   Calcium 9.0 8.4 - 10.4 mg/dL   Anion Gap 11 3 - 11 mEq/L   EGFR 84 (L) >90 ml/min/1.73 m2    UA C&S ordered now due to frequency despite off diuretics, and nausea  Studies/Results:  No results found.  Medications: I have reviewed the patient's current medications. WIll increase OTC pepcid to 2 tablets daily. Can use ativan SL or po in addition to zofran. She has used phenergan suppositories with migraine Rx in past with good results, so will send script to have these on  hand (would not use if neutropenic). Add miralax to present senokot s daily as needed to keep bowels moving daily  DISCUSSION: nausea likely from chemo, but may be increased with constipation, GERD, possible UTI. Plan as above + clear liquids and bland diet today. 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 Large volume ascites with at least 13 liters off since 11-2014. Have begun with weekly dose dense regimen 02-04-15 to allow improvement in PS and symptoms, then consider changing to q 3 week if preferable.  2.nausea today: plan as above, call back if symptoms  are worse today or no better tomorrow. 3.constipation related to disease and surgery: increase laxatives further if needed, trying to keep bowels moving daily. 4.po intake suboptimal:  [facility] dietician to evaluate.  5.elevated BP not known prior to this illness, better today 6.long tobacco DCd fall 2015. Encouraged her to stay off cigarettes 7.post partial thyroidectomy for goiter ~ 1980, on medication subsequently until ~ 1 year ago also related to lack of insurance then. TFTs as requested by Dr Philip Aspen 8.broken lower tooth, uncomfortable but not acutely so: has arranged to be seen by dentist tomorrow. 9.GERD symptoms: increase OTC meds, change to prescription if not adequate.   All questions answered. Patient and husband understand discussion and are in agreement with plans, will call prior to next scheduled appointment if needed. Day 8 cycle 1 + lab 02-11-15 and I will see her back with day 15 cycle 1 treatment.  Chemo orders confirmed. Cc this note and my consultation note to Dr Philip Aspen. Time spent 25 min including >50% counseling and coordination of  care.   LIVESAY,LENNIS P, MD   02/08/2015, 10:26 AM

## 2015-02-08 NOTE — Patient Instructions (Signed)
We will let you know about the urine test.  Please increase over the counter pepcid to 2 tablets daily  Fine to add miralax 1 capful in any liquid to your present laxative/ stool softeners - once daily as needed to keep bowels moving daily.  Clear liquids, saltine crackers, toast today until nausea is better. Call Dr Mariana Kaufman RN if nausea does not get better today or by tomorrow.   [phone]

## 2015-02-09 ENCOUNTER — Other Ambulatory Visit: Payer: Self-pay | Admitting: Oncology

## 2015-02-09 DIAGNOSIS — R350 Frequency of micturition: Secondary | ICD-10-CM | POA: Insufficient documentation

## 2015-02-09 DIAGNOSIS — Z9009 Acquired absence of other part of head and neck: Secondary | ICD-10-CM

## 2015-02-09 DIAGNOSIS — E890 Postprocedural hypothyroidism: Secondary | ICD-10-CM

## 2015-02-09 DIAGNOSIS — C482 Malignant neoplasm of peritoneum, unspecified: Secondary | ICD-10-CM

## 2015-02-09 DIAGNOSIS — K219 Gastro-esophageal reflux disease without esophagitis: Secondary | ICD-10-CM | POA: Insufficient documentation

## 2015-02-09 DIAGNOSIS — T451X5A Adverse effect of antineoplastic and immunosuppressive drugs, initial encounter: Secondary | ICD-10-CM

## 2015-02-09 DIAGNOSIS — R110 Nausea: Secondary | ICD-10-CM | POA: Insufficient documentation

## 2015-02-09 LAB — CA 125: CA 125: 3784 U/mL — AB (ref <35)

## 2015-02-10 LAB — URINE CULTURE

## 2015-02-11 ENCOUNTER — Other Ambulatory Visit: Payer: Self-pay | Admitting: Oncology

## 2015-02-11 ENCOUNTER — Ambulatory Visit (HOSPITAL_BASED_OUTPATIENT_CLINIC_OR_DEPARTMENT_OTHER): Payer: 59

## 2015-02-11 ENCOUNTER — Other Ambulatory Visit (HOSPITAL_BASED_OUTPATIENT_CLINIC_OR_DEPARTMENT_OTHER): Payer: 59

## 2015-02-11 DIAGNOSIS — C482 Malignant neoplasm of peritoneum, unspecified: Secondary | ICD-10-CM

## 2015-02-11 DIAGNOSIS — R350 Frequency of micturition: Secondary | ICD-10-CM

## 2015-02-11 DIAGNOSIS — E890 Postprocedural hypothyroidism: Secondary | ICD-10-CM

## 2015-02-11 DIAGNOSIS — Z9009 Acquired absence of other part of head and neck: Secondary | ICD-10-CM

## 2015-02-11 DIAGNOSIS — Z5111 Encounter for antineoplastic chemotherapy: Secondary | ICD-10-CM | POA: Diagnosis not present

## 2015-02-11 LAB — CBC WITH DIFFERENTIAL/PLATELET
BASO%: 0 % (ref 0.0–2.0)
BASOS ABS: 0 10*3/uL (ref 0.0–0.1)
EOS%: 0 % (ref 0.0–7.0)
Eosinophils Absolute: 0 10*3/uL (ref 0.0–0.5)
HEMATOCRIT: 32.7 % — AB (ref 34.8–46.6)
HEMOGLOBIN: 11.2 g/dL — AB (ref 11.6–15.9)
LYMPH%: 13 % — ABNORMAL LOW (ref 14.0–49.7)
MCH: 28.6 pg (ref 25.1–34.0)
MCHC: 34.3 g/dL (ref 31.5–36.0)
MCV: 83.6 fL (ref 79.5–101.0)
MONO#: 0 10*3/uL — ABNORMAL LOW (ref 0.1–0.9)
MONO%: 0.6 % (ref 0.0–14.0)
NEUT#: 3.1 10*3/uL (ref 1.5–6.5)
NEUT%: 86.4 % — ABNORMAL HIGH (ref 38.4–76.8)
Platelets: 303 10*3/uL (ref 145–400)
RBC: 3.91 10*6/uL (ref 3.70–5.45)
RDW: 13 % (ref 11.2–14.5)
WBC: 3.5 10*3/uL — ABNORMAL LOW (ref 3.9–10.3)
lymph#: 0.5 10*3/uL — ABNORMAL LOW (ref 0.9–3.3)

## 2015-02-11 LAB — URINALYSIS, MICROSCOPIC - CHCC
BILIRUBIN (URINE): NEGATIVE
Glucose: NEGATIVE mg/dL
Ketones: NEGATIVE mg/dL
LEUKOCYTE ESTERASE: NEGATIVE
NITRITE: POSITIVE
Protein: 30 mg/dL
RBC / HPF: NEGATIVE (ref 0–2)
SPECIFIC GRAVITY, URINE: 1.01 (ref 1.003–1.035)
Urobilinogen, UR: 0.2 mg/dL (ref 0.2–1)
pH: 6 (ref 4.6–8.0)

## 2015-02-11 LAB — COMPREHENSIVE METABOLIC PANEL (CC13)
ALT: 21 U/L (ref 0–55)
ANION GAP: 12 meq/L — AB (ref 3–11)
AST: 16 U/L (ref 5–34)
Albumin: 3.1 g/dL — ABNORMAL LOW (ref 3.5–5.0)
Alkaline Phosphatase: 92 U/L (ref 40–150)
BUN: 14.4 mg/dL (ref 7.0–26.0)
CHLORIDE: 98 meq/L (ref 98–109)
CO2: 24 meq/L (ref 22–29)
CREATININE: 0.8 mg/dL (ref 0.6–1.1)
Calcium: 8.9 mg/dL (ref 8.4–10.4)
EGFR: 75 mL/min/{1.73_m2} — ABNORMAL LOW (ref >90)
Glucose: 147 mg/dl — ABNORMAL HIGH (ref 70–140)
Potassium: 3.8 mEq/L (ref 3.5–5.1)
Sodium: 134 mEq/L — ABNORMAL LOW (ref 136–145)
Total Bilirubin: 0.26 mg/dL (ref 0.20–1.20)
Total Protein: 6.9 g/dL (ref 6.4–8.3)

## 2015-02-11 LAB — T4, FREE: Free T4: 1.66 ng/dL (ref 0.80–1.80)

## 2015-02-11 MED ORDER — DIPHENHYDRAMINE HCL 50 MG/ML IJ SOLN
INTRAMUSCULAR | Status: AC
  Filled 2015-02-11: qty 1

## 2015-02-11 MED ORDER — FAMOTIDINE IN NACL 20-0.9 MG/50ML-% IV SOLN
20.0000 mg | Freq: Once | INTRAVENOUS | Status: AC
  Administered 2015-02-11: 20 mg via INTRAVENOUS

## 2015-02-11 MED ORDER — DEXAMETHASONE SODIUM PHOSPHATE 100 MG/10ML IJ SOLN
Freq: Once | INTRAMUSCULAR | Status: AC
  Administered 2015-02-11: 12:00:00 via INTRAVENOUS
  Filled 2015-02-11: qty 4

## 2015-02-11 MED ORDER — LEVOFLOXACIN 250 MG PO TABS: 250.0000 mg | ORAL_TABLET | Freq: Every day | ORAL | Status: DC

## 2015-02-11 MED ORDER — SODIUM CHLORIDE 0.9 % IV SOLN
Freq: Once | INTRAVENOUS | Status: AC
  Administered 2015-02-11: 12:00:00 via INTRAVENOUS

## 2015-02-11 MED ORDER — FAMOTIDINE IN NACL 20-0.9 MG/50ML-% IV SOLN
INTRAVENOUS | Status: AC
  Filled 2015-02-11: qty 50

## 2015-02-11 MED ORDER — DIPHENHYDRAMINE HCL 50 MG/ML IJ SOLN
50.0000 mg | Freq: Once | INTRAMUSCULAR | Status: AC
  Administered 2015-02-11: 50 mg via INTRAVENOUS

## 2015-02-11 MED ORDER — PACLITAXEL CHEMO INJECTION 300 MG/50ML
80.0000 mg/m2 | Freq: Once | INTRAVENOUS | Status: AC
  Administered 2015-02-11: 150 mg via INTRAVENOUS
  Filled 2015-02-11: qty 25

## 2015-02-11 NOTE — Progress Notes (Signed)
UCx and UA showed moderate bacteria.  Dr Marko Plume notified.  Per Dr Marko Plume, pt to take levaquin starting today with dose of benadryl to prevent any reaction.  Levaquin to be called into pt pharmacy.  Pt notified and instructed to pick up benadryl over the counter and push extra fluids.  Pt verbalized understanding.

## 2015-02-11 NOTE — Patient Instructions (Signed)
[facility] Discharge Instructions for Patients Receiving Chemotherapy  Today you received the following chemotherapy agents taxol  To help prevent nausea and vomiting after your treatment, we encourage you to take your nausea medication as directed   If you develop nausea and vomiting that is not controlled by your nausea medication, call the clinic.   BELOW ARE SYMPTOMS THAT SHOULD BE REPORTED IMMEDIATELY:  *FEVER GREATER THAN 100.5 F  *CHILLS WITH OR WITHOUT FEVER  NAUSEA AND VOMITING THAT IS NOT CONTROLLED WITH YOUR NAUSEA MEDICATION  *UNUSUAL SHORTNESS OF BREATH  *UNUSUAL BRUISING OR BLEEDING  TENDERNESS IN MOUTH AND THROAT WITH OR WITHOUT PRESENCE OF ULCERS  *URINARY PROBLEMS  *BOWEL PROBLEMS  UNUSUAL RASH Items with * indicate a potential emergency and should be followed up as soon as possible.  Feel free to call the clinic you have any questions or concerns. The clinic phone number is [phone].  

## 2015-02-12 ENCOUNTER — Telehealth: Payer: Self-pay | Admitting: Oncology

## 2015-02-12 ENCOUNTER — Other Ambulatory Visit: Payer: Self-pay | Admitting: Oncology

## 2015-02-12 DIAGNOSIS — C482 Malignant neoplasm of peritoneum, unspecified: Secondary | ICD-10-CM

## 2015-02-12 DIAGNOSIS — N390 Urinary tract infection, site not specified: Secondary | ICD-10-CM

## 2015-02-12 NOTE — Telephone Encounter (Signed)
Medical Oncology  Called to check on patient as she has several antibiotic allergies and was started on levaquin on 3-24 for Klebsiella UTI, planned for 3 days. LM for her to call back to office if any problems.  Godfrey Pick, MD

## 2015-02-14 ENCOUNTER — Other Ambulatory Visit: Payer: Self-pay | Admitting: Oncology

## 2015-02-15 ENCOUNTER — Ambulatory Visit (HOSPITAL_BASED_OUTPATIENT_CLINIC_OR_DEPARTMENT_OTHER): Payer: 59 | Admitting: Oncology

## 2015-02-15 ENCOUNTER — Telehealth (HOSPITAL_COMMUNITY): Payer: Self-pay

## 2015-02-15 ENCOUNTER — Encounter: Payer: Self-pay | Admitting: Oncology

## 2015-02-15 ENCOUNTER — Other Ambulatory Visit (HOSPITAL_BASED_OUTPATIENT_CLINIC_OR_DEPARTMENT_OTHER): Payer: 59

## 2015-02-15 VITALS — BP 127/50 | HR 105 | Temp 98.0°F | Resp 18 | Ht 67.0 in | Wt 171.2 lb

## 2015-02-15 DIAGNOSIS — A499 Bacterial infection, unspecified: Secondary | ICD-10-CM

## 2015-02-15 DIAGNOSIS — K0381 Cracked tooth: Secondary | ICD-10-CM

## 2015-02-15 DIAGNOSIS — K5904 Chronic idiopathic constipation: Secondary | ICD-10-CM

## 2015-02-15 DIAGNOSIS — R110 Nausea: Secondary | ICD-10-CM

## 2015-02-15 DIAGNOSIS — C55 Malignant neoplasm of uterus, part unspecified: Secondary | ICD-10-CM

## 2015-02-15 DIAGNOSIS — C482 Malignant neoplasm of peritoneum, unspecified: Secondary | ICD-10-CM

## 2015-02-15 DIAGNOSIS — N390 Urinary tract infection, site not specified: Secondary | ICD-10-CM | POA: Diagnosis not present

## 2015-02-15 DIAGNOSIS — Z87891 Personal history of nicotine dependence: Secondary | ICD-10-CM

## 2015-02-15 DIAGNOSIS — K5909 Other constipation: Secondary | ICD-10-CM

## 2015-02-15 DIAGNOSIS — K219 Gastro-esophageal reflux disease without esophagitis: Secondary | ICD-10-CM

## 2015-02-15 DIAGNOSIS — T451X5A Adverse effect of antineoplastic and immunosuppressive drugs, initial encounter: Secondary | ICD-10-CM

## 2015-02-15 LAB — CBC WITH DIFFERENTIAL/PLATELET
BASO%: 0.2 % (ref 0.0–2.0)
BASOS ABS: 0 10*3/uL (ref 0.0–0.1)
EOS%: 1.2 % (ref 0.0–7.0)
Eosinophils Absolute: 0.1 10*3/uL (ref 0.0–0.5)
HEMATOCRIT: 30.7 % — AB (ref 34.8–46.6)
HGB: 10.6 g/dL — ABNORMAL LOW (ref 11.6–15.9)
LYMPH%: 46.1 % (ref 14.0–49.7)
MCH: 28.7 pg (ref 25.1–34.0)
MCHC: 34.5 g/dL (ref 31.5–36.0)
MCV: 83.2 fL (ref 79.5–101.0)
MONO#: 0.3 10*3/uL (ref 0.1–0.9)
MONO%: 8.4 % (ref 0.0–14.0)
NEUT#: 1.8 10*3/uL (ref 1.5–6.5)
NEUT%: 44.1 % (ref 38.4–76.8)
Platelets: 186 10*3/uL (ref 145–400)
RBC: 3.69 10*6/uL — AB (ref 3.70–5.45)
RDW: 13 % (ref 11.2–14.5)
WBC: 4.1 10*3/uL (ref 3.9–10.3)
lymph#: 1.9 10*3/uL (ref 0.9–3.3)

## 2015-02-15 LAB — URINALYSIS, MICROSCOPIC - CHCC
Bilirubin (Urine): NEGATIVE
GLUCOSE UR CHCC: NEGATIVE mg/dL
Ketones: NEGATIVE mg/dL
Leukocyte Esterase: NEGATIVE
NITRITE: NEGATIVE
PH: 6 (ref 4.6–8.0)
Protein: 30 mg/dL
SPECIFIC GRAVITY, URINE: 1.02 (ref 1.003–1.035)
UROBILINOGEN UR: 0.2 mg/dL (ref 0.2–1)

## 2015-02-15 LAB — COMPREHENSIVE METABOLIC PANEL (CC13)
ALBUMIN: 2.8 g/dL — AB (ref 3.5–5.0)
ALT: 19 U/L (ref 0–55)
ANION GAP: 11 meq/L (ref 3–11)
AST: 13 U/L (ref 5–34)
Alkaline Phosphatase: 91 U/L (ref 40–150)
BUN: 14.7 mg/dL (ref 7.0–26.0)
CALCIUM: 8.5 mg/dL (ref 8.4–10.4)
CHLORIDE: 96 meq/L — AB (ref 98–109)
CO2: 27 meq/L (ref 22–29)
Creatinine: 0.8 mg/dL (ref 0.6–1.1)
EGFR: 79 mL/min/{1.73_m2} — ABNORMAL LOW (ref >90)
GLUCOSE: 120 mg/dL (ref 70–140)
POTASSIUM: 3.3 meq/L — AB (ref 3.5–5.1)
Sodium: 135 mEq/L — ABNORMAL LOW (ref 136–145)
Total Bilirubin: 0.4 mg/dL (ref 0.20–1.20)
Total Protein: 6.2 g/dL — ABNORMAL LOW (ref 6.4–8.3)

## 2015-02-15 MED ORDER — DEXAMETHASONE 4 MG PO TABS: ORAL_TABLET | ORAL | Status: DC

## 2015-02-15 NOTE — Progress Notes (Signed)
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 She had day 8 cycle 1 on 02-11-15 and is due day 15 cycle 1 on 02-18-15. She has not had gCSF thus far.  Patient had Klebsiella UTI found on urine culture from 02-08-15; she has multiple antibiotic allergies or intolerances, but took 3 days levaquin beginning 02-11-15 without difficulty. We are repeating the urine tests today, however frequency seems improved. She had no fever.  She has been more fatigued since day 8 treatment. Intermittent nausea is improved with prn zofran and prn ativan, and she is able to drink water without difficulty. Smells are very bothersome and we have discussed cold foods and ways to decrease cooking odors. Bowels are now moving daily with bid senokot S and prn miralax. She has no peripheral neuropathy in hands, but notices some burning sensation on bottoms of feet at night.  Peripheral IV access easily accomplished. She denies abdominal or pelvic pain.    No PAC   ONCOLOGIC HISTORY Patient had not had regular medical care since she lost job and medical insurance. She had progressive abdominal swelling over ~ 6 months as well as some postmenopausal bleeding when she was admitted to [facility] in 11-2014 with abdominal pain and presumed peritonitis, initially thought to be either gall bladder related or cirrhosis. Initial paracentesis 12-11-2014 was for 3.5 liters, with malignant cytology consistent with gyn malignancy. Notes refer to CT and MRI "which demonstrated omental thickening, extensive ascites, slight enlargement of the right adnexa and a thickened endometrium (1.1 cm)." She was  referred to Dr Josephina Shih, seen on 12-25-14 with endometrial biopsy not possible due to cervical stenosis. She had second paracentesis for 4.2 liters on 01-04-15. CA 125 on 01-07-15 was 1941 (pre op).She had TAH BSO, omentectomy and radical debulking by Dr Denman George on 01-12-15, with 4 liters of ascites at time of surgery. Findings at surgery were of milial tumor over entire small bowel serosa, mesentery, diaphragm, plaque on bladder and involving cul de sac peritoneum. Surgery was optimal R1 cytoreduction. She was discharged home on 01-16-15 with lovenox, ~ 12 doses remaining. Pathology [phone]) demonstrated high grade serous carcinoma, IIIC primary peritoneal and at least IB endometrial. She has been recovering without complications from surgery. She saw Dr Denman George on 01-22-15, with recommendation for 6 cycles of carboplatin and taxol, then likely whole pelvic RT + vaginal brachytherapy if she has CR. US paracentesis 01-29-15 for 1.5 liters.    Review of systems as above, also: No increased SOB or cough. She had to cancel dentist appointment for the broken tooth and will reschedule, no acute symptoms there. No bleeding. No LE swelling. She cannot tell any increased ascites. Occasional GERD seems some better on pepcid 20 mg qhs. Remainder of 10 point Review of Systems negative.  Objective:  Vital signs in last 24 hours:  BP 127/50 mmHg  Pulse 105  Temp(Src) 98 F (36.7 C) (Oral)  Resp 18  Ht 5' 7"  (1.702 m)  Wt 171 lb 3.2 oz (77.656 kg)  BMI 26.81 kg/m2 Weight down 2 lbs. Looks tired, alert, oriented and appropriate, very pleasant. Ambulatory without difficulty.  No alopecia  HEENT:PERRL, sclerae not icteric. Oral mucosa moist without lesions,  posterior pharynx clear.  Neck supple. No JVD.  Lymphatics:no cervical,supraclavicula or inguinal adenopathy Resp: somewhat diminished BS thruout otherwise clear to auscultation bilaterally and no dullness to percussion bilaterally Cardio: regular rate and  rhythm. No gallop. GI: soft, nontender, not distended, no mass or organomegaly. Some bowel sounds. Surgical incision still 2 small areas of eschar working off. Musculoskeletal/ Extremities: without pitting edema, cords, tenderness Neuro: no significant peripheral neuropathy. Otherwise nonfocal. PSYCH appropriate mood and affect Skin without rash, ecchymosis, petechiae   Lab Results:  Results for orders placed or performed in visit on 02/15/15  CBC with Differential  Result Value Ref Range   WBC 4.1 3.9 - 10.3 10e3/uL   NEUT# 1.8 1.5 - 6.5 10e3/uL   HGB 10.6 (L) 11.6 - 15.9 g/dL   HCT 30.7 (L) 34.8 - 46.6 %   Platelets 186 145 - 400 10e3/uL   MCV 83.2 79.5 - 101.0 fL   MCH 28.7 25.1 - 34.0 pg   MCHC 34.5 31.5 - 36.0 g/dL   RBC 3.69 (L) 3.70 - 5.45 10e6/uL   RDW 13.0 11.2 - 14.5 %   lymph# 1.9 0.9 - 3.3 10e3/uL   MONO# 0.3 0.1 - 0.9 10e3/uL   Eosinophils Absolute 0.1 0.0 - 0.5 10e3/uL   Basophils Absolute 0.0 0.0 - 0.1 10e3/uL   NEUT% 44.1 38.4 - 76.8 %   LYMPH% 46.1 14.0 - 49.7 %   MONO% 8.4 0.0 - 14.0 %   EOS% 1.2 0.0 - 7.0 %   BASO% 0.2 0.0 - 2.0 %  Comprehensive metabolic panel (Cmet) - [facility]  Result Value Ref Range   Sodium 135 (L) 136 - 145 mEq/L   Potassium 3.3 (L) 3.5 - 5.1 mEq/L   Chloride 96 (L) 98 - 109 mEq/L   CO2 27 22 - 29 mEq/L   Glucose 120 70 - 140 mg/dl   BUN 14.7 7.0 - 26.0 mg/dL   Creatinine 0.8 0.6 - 1.1 mg/dL   Total Bilirubin 0.40 0.20 - 1.20 mg/dL   Alkaline Phosphatase 91 40 - 150 U/L   AST 13 5 - 34 U/L   ALT 19 0 - 55 U/L   Total Protein 6.2 (L) 6.4 - 8.3 g/dL   Albumin 2.8 (L) 3.5 - 5.0 g/dL   Calcium 8.5 8.4 - 10.4 mg/dL   Anion Gap 11 3 - 11 mEq/L   EGFR 79 (L) >90 ml/min/1.73 m2  Urinalysis with microscopic - [facility]  Result Value Ref Range   Glucose Negative Negative mg/dL   Bilirubin (Urine) Negative Negative   Ketones Negative Negative mg/dL   Specific Gravity, Urine 1.020 1.003 - 1.035   Blood Trace Negative   pH 6.0 4.6 - 8.0    Protein 30 Negative- <30 mg/dL   Urobilinogen, UR 0.2 0.2 - 1 mg/dL   Nitrite Negative Negative   Leukocyte Esterase Negative Negative   RBC / HPF 0-2 0 - 2   WBC, UA 0-2 0 - 2   Mucus, UA Small Negative- Small     Free T4 was 1.66 on 02-11-15 TSH was <0.080 on 02-04-15 These results sent to Dr Philip Aspen   Repeat urine culture pending, as UA 3-21 had only 7-10 WBC when culture found >100,000 Klebsiella.  Studies/Results: Urine culture 02-08-15    Comments: Final - ===== COLONY COUNT: =====  >=100,000 COLONIES/ML  KLEBSIELLA PNEUMONIAE   ------------------------------------------------------------------------  KLEBSIELLA PNEUMONIAE    AMPICILLIN            MIC  Resistant    >=32 ug/ml   AMOX/CLAVULANIC         MIC   Sensitive    <=2 ug/ml   AMPICILLIN/SUL          MIC   Sensitive     8 ug/ml   PIPERACILLIN/TAZO        MIC   Sensitive     8 ug/ml   IMIPENEM             MIC   Sensitive   <=0.25 ug/ml   CEFAZOLIN            MIC   Sensitive    <=4 ug/ml   CEFTRIAXONE           MIC   Sensitive    <=1 ug/ml   CEFTAZIDIME           MIC   Sensitive    <=1 ug/ml   CEFEPIME             MIC   Sensitive    <=1 ug/ml   GENTAMICIN            MIC   Sensitive    <=1 ug/ml   TOBRAMYCIN            MIC   Sensitive    <=1 ug/ml   CIPROFLOXACIN          MIC   Sensitive   <=0.25 ug/ml   LEVOFLOXACIN           MIC   Sensitive   <=0.12 ug/ml   NITROFURANTOIN          MIC   Resistant    128 ug/ml   TRIMETH/SULFA          MIC   Sensitive    <=20 ug/ml  END OF REPORT          Medications: I have reviewed the patient's current medications.   DISCUSSION: discussed all of symptoms as above. Counts may drop further by  3-31, needs repeat CBC that day and may need granix added day after each treatment - discussed this in general with patient and husband now. With low K will try to increase in diet and repeat BMET also on 3-31.  Dietary suggestions as above - she does like ice cream, is not using supplements now.  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 Large volume ascites with at least 13 liters off since 11-2014. Weekly dose dense regimen 02-04-15 to allow improvement in PS and symptoms, then consider changing to q 3 week if preferable. Will have day 15 cycle 1 on 02-18-15 if [place] >=1.4 and plt >=100k; may need granix added beginning 4-1 (or start granix 3-31 if counts not adequate for chemo that day). 2.Klebsiella UTI: post 3 days levaquin completed 3-26, follow up repeat urine sent today. 3.constipation related to disease and surgery: increase laxatives further if needed, trying to keep bowels moving daily. 4.po intake suboptimal: Flora met with her last week.  5.elevated BP not known prior to this illness, better today 6.long tobacco DCd fall 2015.  7.post partial thyroidectomy for goiter ~ 1980, on medication subsequently until ~ 1 year ago also related to lack of insurance then. TFTs as requested by Dr Philip Aspen forwarded to him. 8.broken lower tooth, uncomfortable but not acutely so:  will reschedule with dentist, need to be careful with counts if invasive procedure. 9.GERD symptoms: on OTC pepcid now regularly, change to prescription if not adequate. 10. K low at 3.3 today: will let her know to increase in diet and recheck BMET on 3-31  All questions answered. Patient and husband express appreciation for care and know to call if problems or questions prior to next scheduled visit. Chemo orders  confirmed with comments for treatment parameters 02-18-15, granix has been approved if needed. TIme spent 25 min including >50% counseling and coordination of care. Cc this note Dr Candace Gallus, MD   02/15/2015, 8:13 PM

## 2015-02-15 NOTE — Telephone Encounter (Signed)
02/15/15             Called patient to schedule Dental Consult w/Dr. Enrique Sack.  Patient ill from chemo treatments and will call back at a later date to schedule appt.  Will  wait to hear from patient.  LRI

## 2015-02-16 ENCOUNTER — Telehealth: Payer: Self-pay | Admitting: *Deleted

## 2015-02-16 ENCOUNTER — Telehealth: Payer: Self-pay | Admitting: Oncology

## 2015-02-16 DIAGNOSIS — N390 Urinary tract infection, site not specified: Secondary | ICD-10-CM

## 2015-02-16 DIAGNOSIS — A499 Bacterial infection, unspecified: Secondary | ICD-10-CM | POA: Insufficient documentation

## 2015-02-16 NOTE — Telephone Encounter (Signed)
-----   Message from Gordy Levan, MD sent at 02/16/2015  7:37 AM EDT ----- Labs seen and need follow up:  Please let her know that potassium was a little low on 3-28. Have her try to increase potassium in diet, suggest cantelope, white potatoes, OJ is fine if she can tolerate, strawberries, gatorade. We will recheck the potassium with labs on 3-31. (see my note, food odors bothersome and some nausea/ GERD)    thanks

## 2015-02-16 NOTE — Telephone Encounter (Signed)
Called pt per Dr Mariana Kaufman message & informed to try to increase potassium in her diet due to K+ being low.  Food suggestions given.  We will recheck labs on 02/18/15. Pt expressed understanding.

## 2015-02-16 NOTE — Telephone Encounter (Signed)
Spoke with patient and she is aware of her appointments °

## 2015-02-17 LAB — URINE CULTURE

## 2015-02-18 ENCOUNTER — Other Ambulatory Visit (HOSPITAL_BASED_OUTPATIENT_CLINIC_OR_DEPARTMENT_OTHER): Payer: 59

## 2015-02-18 ENCOUNTER — Other Ambulatory Visit: Payer: Self-pay | Admitting: Oncology

## 2015-02-18 ENCOUNTER — Telehealth: Payer: Self-pay

## 2015-02-18 ENCOUNTER — Ambulatory Visit (HOSPITAL_BASED_OUTPATIENT_CLINIC_OR_DEPARTMENT_OTHER): Payer: 59

## 2015-02-18 DIAGNOSIS — C482 Malignant neoplasm of peritoneum, unspecified: Secondary | ICD-10-CM

## 2015-02-18 DIAGNOSIS — C541 Malignant neoplasm of endometrium: Secondary | ICD-10-CM | POA: Diagnosis not present

## 2015-02-18 DIAGNOSIS — Z5111 Encounter for antineoplastic chemotherapy: Secondary | ICD-10-CM | POA: Diagnosis not present

## 2015-02-18 LAB — CBC WITH DIFFERENTIAL/PLATELET
BASO%: 0 % (ref 0.0–2.0)
Basophils Absolute: 0 10*3/uL (ref 0.0–0.1)
EOS ABS: 0 10*3/uL (ref 0.0–0.5)
EOS%: 0 % (ref 0.0–7.0)
HEMATOCRIT: 29.7 % — AB (ref 34.8–46.6)
HGB: 10.3 g/dL — ABNORMAL LOW (ref 11.6–15.9)
LYMPH%: 17.6 % (ref 14.0–49.7)
MCH: 28.9 pg (ref 25.1–34.0)
MCHC: 34.7 g/dL (ref 31.5–36.0)
MCV: 83.2 fL (ref 79.5–101.0)
MONO#: 0.1 10*3/uL (ref 0.1–0.9)
MONO%: 2.3 % (ref 0.0–14.0)
NEUT%: 80.1 % — ABNORMAL HIGH (ref 38.4–76.8)
NEUTROS ABS: 2.4 10*3/uL (ref 1.5–6.5)
PLATELETS: 157 10*3/uL (ref 145–400)
RBC: 3.57 10*6/uL — ABNORMAL LOW (ref 3.70–5.45)
RDW: 13.1 % (ref 11.2–14.5)
WBC: 3 10*3/uL — ABNORMAL LOW (ref 3.9–10.3)
lymph#: 0.5 10*3/uL — ABNORMAL LOW (ref 0.9–3.3)

## 2015-02-18 LAB — BASIC METABOLIC PANEL (CC13)
Anion Gap: 13 mEq/L — ABNORMAL HIGH (ref 3–11)
BUN: 15.5 mg/dL (ref 7.0–26.0)
CHLORIDE: 99 meq/L (ref 98–109)
CO2: 25 mEq/L (ref 22–29)
CREATININE: 0.8 mg/dL (ref 0.6–1.1)
Calcium: 9 mg/dL (ref 8.4–10.4)
EGFR: 83 mL/min/{1.73_m2} — AB (ref >90)
GLUCOSE: 181 mg/dL — AB (ref 70–140)
Potassium: 3.4 mEq/L — ABNORMAL LOW (ref 3.5–5.1)
Sodium: 137 mEq/L (ref 136–145)

## 2015-02-18 MED ORDER — PACLITAXEL CHEMO INJECTION 300 MG/50ML
80.0000 mg/m2 | Freq: Once | INTRAVENOUS | Status: AC
  Administered 2015-02-18: 150 mg via INTRAVENOUS
  Filled 2015-02-18: qty 25

## 2015-02-18 MED ORDER — DIPHENHYDRAMINE HCL 50 MG/ML IJ SOLN
50.0000 mg | Freq: Once | INTRAMUSCULAR | Status: AC
  Administered 2015-02-18: 50 mg via INTRAVENOUS

## 2015-02-18 MED ORDER — SODIUM CHLORIDE 0.9 % IV SOLN
Freq: Once | INTRAVENOUS | Status: AC
  Administered 2015-02-18: 12:00:00 via INTRAVENOUS

## 2015-02-18 MED ORDER — FAMOTIDINE IN NACL 20-0.9 MG/50ML-% IV SOLN
20.0000 mg | Freq: Once | INTRAVENOUS | Status: AC
  Administered 2015-02-18: 20 mg via INTRAVENOUS

## 2015-02-18 MED ORDER — FAMOTIDINE IN NACL 20-0.9 MG/50ML-% IV SOLN
INTRAVENOUS | Status: AC
  Filled 2015-02-18: qty 50

## 2015-02-18 MED ORDER — DIPHENHYDRAMINE HCL 50 MG/ML IJ SOLN
INTRAMUSCULAR | Status: AC
  Filled 2015-02-18: qty 1

## 2015-02-18 MED ORDER — SODIUM CHLORIDE 0.9 % IV SOLN
Freq: Once | INTRAVENOUS | Status: AC
  Administered 2015-02-18: 12:00:00 via INTRAVENOUS
  Filled 2015-02-18: qty 4

## 2015-02-18 NOTE — Telephone Encounter (Signed)
Told Jessica Payne the results of the culture as noted below by Dr. Marko Plume.

## 2015-02-18 NOTE — Patient Instructions (Signed)
[facility] Discharge Instructions for Patients Receiving Chemotherapy  Today you received the following chemotherapy agents taxol  To help prevent nausea and vomiting after your treatment, we encourage you to take your nausea medication as directed   If you develop nausea and vomiting that is not controlled by your nausea medication, call the clinic.   BELOW ARE SYMPTOMS THAT SHOULD BE REPORTED IMMEDIATELY:  *FEVER GREATER THAN 100.5 F  *CHILLS WITH OR WITHOUT FEVER  NAUSEA AND VOMITING THAT IS NOT CONTROLLED WITH YOUR NAUSEA MEDICATION  *UNUSUAL SHORTNESS OF BREATH  *UNUSUAL BRUISING OR BLEEDING  TENDERNESS IN MOUTH AND THROAT WITH OR WITHOUT PRESENCE OF ULCERS  *URINARY PROBLEMS  *BOWEL PROBLEMS  UNUSUAL RASH Items with * indicate a potential emergency and should be followed up as soon as possible.  Feel free to call the clinic you have any questions or concerns. The clinic phone number is [phone].  

## 2015-02-18 NOTE — Telephone Encounter (Signed)
-----   Message from Gordy Levan, MD sent at 02/17/2015  2:06 PM EDT ----- Labs seen and need follow up please let her know no infection in this culture

## 2015-02-19 ENCOUNTER — Ambulatory Visit: Payer: 59

## 2015-02-24 ENCOUNTER — Other Ambulatory Visit: Payer: Self-pay | Admitting: Oncology

## 2015-02-24 DIAGNOSIS — C482 Malignant neoplasm of peritoneum, unspecified: Secondary | ICD-10-CM

## 2015-02-24 DIAGNOSIS — C55 Malignant neoplasm of uterus, part unspecified: Secondary | ICD-10-CM

## 2015-02-25 ENCOUNTER — Ambulatory Visit (HOSPITAL_BASED_OUTPATIENT_CLINIC_OR_DEPARTMENT_OTHER): Payer: 59

## 2015-02-25 DIAGNOSIS — Z5111 Encounter for antineoplastic chemotherapy: Secondary | ICD-10-CM

## 2015-02-25 DIAGNOSIS — C482 Malignant neoplasm of peritoneum, unspecified: Secondary | ICD-10-CM

## 2015-02-25 DIAGNOSIS — C55 Malignant neoplasm of uterus, part unspecified: Secondary | ICD-10-CM

## 2015-02-25 DIAGNOSIS — C541 Malignant neoplasm of endometrium: Secondary | ICD-10-CM

## 2015-02-25 LAB — COMPREHENSIVE METABOLIC PANEL (CC13)
ALK PHOS: 112 U/L (ref 40–150)
ALT: 22 U/L (ref 0–55)
ANION GAP: 13 meq/L — AB (ref 3–11)
AST: 15 U/L (ref 5–34)
Albumin: 3.1 g/dL — ABNORMAL LOW (ref 3.5–5.0)
BILIRUBIN TOTAL: 0.38 mg/dL (ref 0.20–1.20)
BUN: 13.8 mg/dL (ref 7.0–26.0)
CO2: 24 meq/L (ref 22–29)
Calcium: 9 mg/dL (ref 8.4–10.4)
Chloride: 102 mEq/L (ref 98–109)
Creatinine: 0.7 mg/dL (ref 0.6–1.1)
GLUCOSE: 160 mg/dL — AB (ref 70–140)
Potassium: 3.7 mEq/L (ref 3.5–5.1)
SODIUM: 138 meq/L (ref 136–145)
TOTAL PROTEIN: 6.9 g/dL (ref 6.4–8.3)

## 2015-02-25 LAB — CBC WITH DIFFERENTIAL/PLATELET
BASO%: 0.3 % (ref 0.0–2.0)
BASOS ABS: 0 10*3/uL (ref 0.0–0.1)
EOS%: 0 % (ref 0.0–7.0)
Eosinophils Absolute: 0 10*3/uL (ref 0.0–0.5)
HCT: 30 % — ABNORMAL LOW (ref 34.8–46.6)
HGB: 10.3 g/dL — ABNORMAL LOW (ref 11.6–15.9)
LYMPH%: 14.8 % (ref 14.0–49.7)
MCH: 28.9 pg (ref 25.1–34.0)
MCHC: 34.3 g/dL (ref 31.5–36.0)
MCV: 84.3 fL (ref 79.5–101.0)
MONO#: 0 10*3/uL — ABNORMAL LOW (ref 0.1–0.9)
MONO%: 0.8 % (ref 0.0–14.0)
NEUT#: 3.4 10*3/uL (ref 1.5–6.5)
NEUT%: 84.1 % — AB (ref 38.4–76.8)
PLATELETS: 237 10*3/uL (ref 145–400)
RBC: 3.56 10*6/uL — AB (ref 3.70–5.45)
RDW: 14.1 % (ref 11.2–14.5)
WBC: 4 10*3/uL (ref 3.9–10.3)
lymph#: 0.6 10*3/uL — ABNORMAL LOW (ref 0.9–3.3)

## 2015-02-25 LAB — CA 125: CA 125: 2086 U/mL — ABNORMAL HIGH (ref <35)

## 2015-02-25 MED ORDER — FAMOTIDINE IN NACL 20-0.9 MG/50ML-% IV SOLN
20.0000 mg | Freq: Once | INTRAVENOUS | Status: AC
  Administered 2015-02-25: 20 mg via INTRAVENOUS

## 2015-02-25 MED ORDER — FAMOTIDINE IN NACL 20-0.9 MG/50ML-% IV SOLN
INTRAVENOUS | Status: AC
  Filled 2015-02-25: qty 50

## 2015-02-25 MED ORDER — PACLITAXEL CHEMO INJECTION 300 MG/50ML
80.0000 mg/m2 | Freq: Once | INTRAVENOUS | Status: AC
  Administered 2015-02-25: 150 mg via INTRAVENOUS
  Filled 2015-02-25: qty 25

## 2015-02-25 MED ORDER — DIPHENHYDRAMINE HCL 50 MG/ML IJ SOLN
INTRAMUSCULAR | Status: AC
  Filled 2015-02-25: qty 1

## 2015-02-25 MED ORDER — DIPHENHYDRAMINE HCL 50 MG/ML IJ SOLN
50.0000 mg | Freq: Once | INTRAMUSCULAR | Status: AC
  Administered 2015-02-25: 50 mg via INTRAVENOUS

## 2015-02-25 MED ORDER — SODIUM CHLORIDE 0.9 % IV SOLN
682.8000 mg | Freq: Once | INTRAVENOUS | Status: AC
  Administered 2015-02-25: 680 mg via INTRAVENOUS
  Filled 2015-02-25: qty 68

## 2015-02-25 MED ORDER — DEXAMETHASONE SODIUM PHOSPHATE 100 MG/10ML IJ SOLN
Freq: Once | INTRAMUSCULAR | Status: AC
  Administered 2015-02-25: 12:00:00 via INTRAVENOUS
  Filled 2015-02-25: qty 8

## 2015-02-25 MED ORDER — SODIUM CHLORIDE 0.9 % IV SOLN
Freq: Once | INTRAVENOUS | Status: AC
  Administered 2015-02-25: 12:00:00 via INTRAVENOUS

## 2015-02-25 NOTE — Patient Instructions (Signed)
[facility] Discharge Instructions for Patients Receiving Chemotherapy  Today you received the following chemotherapy agents Taxol and Carboplatin.  To help prevent nausea and vomiting after your treatment, we encourage you to take your nausea medication as prescribed.   If you develop nausea and vomiting that is not controlled by your nausea medication, call the clinic.   BELOW ARE SYMPTOMS THAT SHOULD BE REPORTED IMMEDIATELY:  *FEVER GREATER THAN 100.5 F  *CHILLS WITH OR WITHOUT FEVER  NAUSEA AND VOMITING THAT IS NOT CONTROLLED WITH YOUR NAUSEA MEDICATION  *UNUSUAL SHORTNESS OF BREATH  *UNUSUAL BRUISING OR BLEEDING  TENDERNESS IN MOUTH AND THROAT WITH OR WITHOUT PRESENCE OF ULCERS  *URINARY PROBLEMS  *BOWEL PROBLEMS  UNUSUAL RASH Items with * indicate a potential emergency and should be followed up as soon as possible.  Feel free to call the clinic you have any questions or concerns. The clinic phone number is [phone].  Please show the CHEMO ALERT CARD at check-in to the Emergency Department and triage nurse.   

## 2015-02-26 ENCOUNTER — Ambulatory Visit (HOSPITAL_BASED_OUTPATIENT_CLINIC_OR_DEPARTMENT_OTHER): Payer: 59

## 2015-02-26 DIAGNOSIS — C541 Malignant neoplasm of endometrium: Secondary | ICD-10-CM | POA: Diagnosis not present

## 2015-02-26 DIAGNOSIS — C482 Malignant neoplasm of peritoneum, unspecified: Secondary | ICD-10-CM

## 2015-02-26 DIAGNOSIS — Z5189 Encounter for other specified aftercare: Secondary | ICD-10-CM | POA: Diagnosis not present

## 2015-02-26 MED ORDER — TBO-FILGRASTIM 300 MCG/0.5ML ~~LOC~~ SOSY
300.0000 ug | PREFILLED_SYRINGE | Freq: Once | SUBCUTANEOUS | Status: AC
  Administered 2015-02-26: 300 ug via SUBCUTANEOUS
  Filled 2015-02-26: qty 0.5

## 2015-02-26 NOTE — Patient Instructions (Signed)

## 2015-03-01 ENCOUNTER — Encounter: Payer: Self-pay | Admitting: Oncology

## 2015-03-01 ENCOUNTER — Ambulatory Visit (HOSPITAL_BASED_OUTPATIENT_CLINIC_OR_DEPARTMENT_OTHER): Payer: 59 | Admitting: Oncology

## 2015-03-01 ENCOUNTER — Other Ambulatory Visit (HOSPITAL_BASED_OUTPATIENT_CLINIC_OR_DEPARTMENT_OTHER): Payer: 59

## 2015-03-01 VITALS — BP 132/73 | HR 69 | Temp 98.0°F | Resp 18 | Ht 67.0 in | Wt 170.0 lb

## 2015-03-01 DIAGNOSIS — C482 Malignant neoplasm of peritoneum, unspecified: Secondary | ICD-10-CM | POA: Diagnosis not present

## 2015-03-01 DIAGNOSIS — R180 Malignant ascites: Secondary | ICD-10-CM

## 2015-03-01 DIAGNOSIS — N882 Stricture and stenosis of cervix uteri: Secondary | ICD-10-CM

## 2015-03-01 DIAGNOSIS — S025XXA Fracture of tooth (traumatic), initial encounter for closed fracture: Secondary | ICD-10-CM

## 2015-03-01 DIAGNOSIS — C541 Malignant neoplasm of endometrium: Secondary | ICD-10-CM

## 2015-03-01 DIAGNOSIS — N950 Postmenopausal bleeding: Secondary | ICD-10-CM

## 2015-03-01 DIAGNOSIS — C55 Malignant neoplasm of uterus, part unspecified: Secondary | ICD-10-CM

## 2015-03-01 DIAGNOSIS — N390 Urinary tract infection, site not specified: Secondary | ICD-10-CM

## 2015-03-01 DIAGNOSIS — D6481 Anemia due to antineoplastic chemotherapy: Secondary | ICD-10-CM | POA: Diagnosis not present

## 2015-03-01 DIAGNOSIS — R109 Unspecified abdominal pain: Secondary | ICD-10-CM | POA: Diagnosis not present

## 2015-03-01 DIAGNOSIS — T451X5A Adverse effect of antineoplastic and immunosuppressive drugs, initial encounter: Secondary | ICD-10-CM

## 2015-03-01 DIAGNOSIS — K5909 Other constipation: Secondary | ICD-10-CM

## 2015-03-01 DIAGNOSIS — Z87891 Personal history of nicotine dependence: Secondary | ICD-10-CM

## 2015-03-01 LAB — CBC WITH DIFFERENTIAL/PLATELET
BASO%: 0.7 % (ref 0.0–2.0)
Basophils Absolute: 0 10*3/uL (ref 0.0–0.1)
EOS%: 3.2 % (ref 0.0–7.0)
Eosinophils Absolute: 0.1 10*3/uL (ref 0.0–0.5)
HCT: 29.5 % — ABNORMAL LOW (ref 34.8–46.6)
HGB: 9.7 g/dL — ABNORMAL LOW (ref 11.6–15.9)
LYMPH#: 1.3 10*3/uL (ref 0.9–3.3)
LYMPH%: 33.2 % (ref 14.0–49.7)
MCH: 28 pg (ref 25.1–34.0)
MCHC: 33.1 g/dL (ref 31.5–36.0)
MCV: 84.7 fL (ref 79.5–101.0)
MONO#: 0.1 10*3/uL (ref 0.1–0.9)
MONO%: 3.3 % (ref 0.0–14.0)
NEUT#: 2.4 10*3/uL (ref 1.5–6.5)
NEUT%: 59.6 % (ref 38.4–76.8)
Platelets: 247 10*3/uL (ref 145–400)
RBC: 3.48 10*6/uL — ABNORMAL LOW (ref 3.70–5.45)
RDW: 14.8 % — AB (ref 11.2–14.5)
WBC: 4.1 10*3/uL (ref 3.9–10.3)

## 2015-03-01 LAB — URINALYSIS, MICROSCOPIC - CHCC
Bilirubin (Urine): NEGATIVE
Glucose: NEGATIVE mg/dL
Ketones: 5 mg/dL
LEUKOCYTE ESTERASE: NEGATIVE
Nitrite: NEGATIVE
PH: 6 (ref 4.6–8.0)
Protein: 30 mg/dL
Specific Gravity, Urine: 1.025 (ref 1.003–1.035)
Urobilinogen, UR: 0.2 mg/dL (ref 0.2–1)

## 2015-03-01 LAB — COMPREHENSIVE METABOLIC PANEL (CC13)
ALT: 15 U/L (ref 0–55)
AST: 13 U/L (ref 5–34)
Albumin: 3 g/dL — ABNORMAL LOW (ref 3.5–5.0)
Alkaline Phosphatase: 98 U/L (ref 40–150)
Anion Gap: 10 mEq/L (ref 3–11)
BILIRUBIN TOTAL: 0.54 mg/dL (ref 0.20–1.20)
BUN: 11.9 mg/dL (ref 7.0–26.0)
CO2: 29 mEq/L (ref 22–29)
CREATININE: 0.7 mg/dL (ref 0.6–1.1)
Calcium: 8.6 mg/dL (ref 8.4–10.4)
Chloride: 96 mEq/L — ABNORMAL LOW (ref 98–109)
EGFR: 88 mL/min/{1.73_m2} — ABNORMAL LOW (ref >90)
Glucose: 153 mg/dl — ABNORMAL HIGH (ref 70–140)
Potassium: 3.7 mEq/L (ref 3.5–5.1)
Sodium: 136 mEq/L (ref 136–145)
Total Protein: 6.3 g/dL — ABNORMAL LOW (ref 6.4–8.3)

## 2015-03-01 MED ORDER — LORAZEPAM 1 MG PO TABS: ORAL_TABLET | ORAL | Status: DC

## 2015-03-01 MED ORDER — OXYCODONE HCL 5 MG PO TABS: 5.0000 mg | ORAL_TABLET | ORAL | Status: DC | PRN

## 2015-03-01 MED ORDER — FERROUS FUMARATE 325 (106 FE) MG PO TABS: ORAL_TABLET | ORAL | Status: DC

## 2015-03-01 NOTE — Progress Notes (Signed)
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  Chemo begun with weekly dose dense regimen to allow improvement in PS and symptoms, then consider changing to q 3 week if preferable.  Patient has had more abdominal discomfort, nausea and constipation in past few days. The abdominal discomfort is upper mid abdomen and RLQ, which were also most symptomatic at time of diagnosis. The RLQ pain is most noticeable when she bears weight on RLE. Bowels have not moved well in 2-3 days and she has also not used miralax in last few days, but will resume this daily. She has had some nausea, zofran helpful. She has urinary frequency without dysuria, as with Klebsiella UTI previously so will repeat urine culture. She is fatigued, but not SOB at rest. She has not had overt bleeding. Only minimal aches after taxol and granix, has not used claritin.    No PAC  ONCOLOGIC HISTORY Patient had not had regular medical care since she lost job and medical insurance. She had progressive abdominal swelling over ~ 6 months as well as some postmenopausal bleeding when she was admitted to [facility] in 11-2014 with abdominal pain and presumed peritonitis, initially thought to be either gall bladder related or cirrhosis. Initial paracentesis 12-11-2014 was for 3.5 liters, with malignant cytology consistent with gyn malignancy. Notes refer to CT and MRI "which demonstrated omental thickening, extensive ascites, slight enlargement of the right adnexa and a thickened endometrium (1.1 cm)." She was referred to Dr Josephina Shih, seen on 12-25-14 with endometrial biopsy not  possible due to cervical stenosis. She had second paracentesis for 4.2 liters on 01-04-15. CA 125 on 01-07-15 was 1941 (pre op).She had TAH BSO, omentectomy and radical debulking by Dr Denman George on 01-12-15, with 4 liters of ascites at time of surgery. Findings at surgery were of milial tumor over entire small bowel serosa, mesentery, diaphragm, plaque on bladder and involving cul de sac peritoneum. Surgery was optimal R1 cytoreduction. She was discharged home on 01-16-15 with lovenox, ~ 12 doses remaining. Pathology [phone]) demonstrated high grade serous carcinoma, IIIC primary peritoneal and at least IB endometrial. She has been recovering without complications from surgery. She saw Dr Denman George on 01-22-15, with recommendation for 6 cycles of carboplatin and taxol, then likely whole pelvic RT + vaginal brachytherapy if she has CR. US paracentesis 01-29-15 for 1.5 liters.     Review of systems as above, also: No fever or other symptoms of infection. No cough or chest pain. No LE swelling. Peripheral IV access ok. No significant peripheral neuropathy Remainder of 10 point Review of Systems negative.  Objective:  Vital signs in last 24 hours:  BP 132/73 mmHg  Pulse 69  Temp(Src) 98 F (36.7 C) (Oral)  Resp 18  Ht 5' 7"  (1.702 m)  Wt 170 lb (77.111 kg)  BMI 26.62 kg/m2 Weight down 1 lb. Alert, oriented and appropriate. Ambulatory slowly, holds RLQ abdomen. Looks mildly uncomfortable, pale not icteric, respirations not labored RA  Partial alopecia  HEENT:PERRL, sclerae not icteric. Oral mucosa moist without lesions, posterior pharynx with some dull erythema consistent with post nasal drainage.  Neck supple. No JVD.  Lymphatics:no cervical,supraclavicular or  inguinal adenopathy Resp: clear to auscultation bilaterally and normal percussion bilaterally Cardio: regular rate and rhythm. No gallop. GI: soft, not tender epigastrium or lower quadrants to gentle palpation, slightly distended, no mass or  organomegaly. A few bowel sounds. Surgical incision not remarkable. Musculoskeletal/ Extremities: without pitting edema, cords, tenderness Neuro: no peripheral neuropathy. Otherwise nonfocal. PSYCH appropriate mood and affect Skin without rash, ecchymosis, petechiae   Lab Results:  Results for orders placed or performed in visit on 03/01/15  CBC with Differential  Result Value Ref Range   WBC 4.1 3.9 - 10.3 10e3/uL   NEUT# 2.4 1.5 - 6.5 10e3/uL   HGB 9.7 (L) 11.6 - 15.9 g/dL   HCT 29.5 (L) 34.8 - 46.6 %   Platelets 247 145 - 400 10e3/uL   MCV 84.7 79.5 - 101.0 fL   MCH 28.0 25.1 - 34.0 pg   MCHC 33.1 31.5 - 36.0 g/dL   RBC 3.48 (L) 3.70 - 5.45 10e6/uL   RDW 14.8 (H) 11.2 - 14.5 %   lymph# 1.3 0.9 - 3.3 10e3/uL   MONO# 0.1 0.1 - 0.9 10e3/uL   Eosinophils Absolute 0.1 0.0 - 0.5 10e3/uL   Basophils Absolute 0.0 0.0 - 0.1 10e3/uL   NEUT% 59.6 38.4 - 76.8 %   LYMPH% 33.2 14.0 - 49.7 %   MONO% 3.3 0.0 - 14.0 %   EOS% 3.2 0.0 - 7.0 %   BASO% 0.7 0.0 - 2.0 %  Comprehensive metabolic panel (Cmet) - [facility]  Result Value Ref Range   Sodium 136 136 - 145 mEq/L   Potassium 3.7 3.5 - 5.1 mEq/L   Chloride 96 (L) 98 - 109 mEq/L   CO2 29 22 - 29 mEq/L   Glucose 153 (H) 70 - 140 mg/dl   BUN 11.9 7.0 - 26.0 mg/dL   Creatinine 0.7 0.6 - 1.1 mg/dL   Total Bilirubin 0.54 0.20 - 1.20 mg/dL   Alkaline Phosphatase 98 40 - 150 U/L   AST 13 5 - 34 U/L   ALT 15 0 - 55 U/L   Total Protein 6.3 (L) 6.4 - 8.3 g/dL   Albumin 3.0 (L) 3.5 - 5.0 g/dL   Calcium 8.6 8.4 - 10.4 mg/dL   Anion Gap 10 3 - 11 mEq/L   EGFR 88 (L) >90 ml/min/1.73 m2  Urinalysis with microscopic - [facility]  Result Value Ref Range   Glucose Negative Negative mg/dL   Bilirubin (Urine) Negative Negative   Ketones 5 Negative mg/dL   Specific Gravity, Urine 1.025 1.003 - 1.035   Blood Trace Negative   pH 6.0 4.6 - 8.0   Protein 30 Negative- <30 mg/dL   Urobilinogen, UR 0.2 0.2 - 1 mg/dL   Nitrite Negative Negative   Leukocyte  Esterase Negative Negative   RBC / HPF 3-6 0 - 2   WBC, UA 11-20 0 - 2   Bacteria, UA Few Negative- Trace   Casts Hyaline Negative   Epithelial Cells Few Negative- Few   Mucus, UA Small Negative- Small   Urine culture pending  CA 125 02-25-15 2086, this having been 3784 on 02-08-15 and 1941 pre op.  Studies/Results:  No results found.  Medications: I have reviewed the patient's current medications. She will take miralax 1-2x daily to keep bowels moving daily. Add ferrous fumarate/ Hemocyte. Add claritin daily  DISCUSSION: abdominal discomfort likely related to constipation now, tho may need to check for recurrent ascites if continues. Suggested support garment may also be helpful. Discussed mild anemia,  add iron and follow Will begin antibiotics if urine culture positive.    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 Continue dose dense carbo taxol with day 8 cycle 2 on 4-14 and granix on 4-15. May need US/ possible paracentesis if abdominal symptoms do not resolve. I will see her with treatment 03-11-15. 2.urinary frequency: urine C&S pending, recent Klebsiella UTI 3.constipation related to disease and surgery: increase laxatives, try to keep bowels moving daily. 4.po intake suboptimal: [place] met with her last week.  5.anemia from chemo, surgery: add ferrous fumarate 6.long tobacco DCd fall 2015.  7.post partial thyroidectomy for goiter ~ 1980, on medication subsequently until ~ 1 year ago also related to lack of insurance then. TFTs as requested by Dr Philip Aspen forwarded to him. 8.broken lower tooth, uncomfortable but not acutely so: will reschedule with dentist, need to be careful with counts if invasive procedure. 9.GERD symptoms: on OTC pepcid  now regularly, change to prescription if not adequate. 10. Hypo K corrected 11. Has been given information on advanced directives  All questions addressed. Chemo and granix orders confirmed. Patient knows to call prior to next scheduled visit if needed. Time spent 25 min including >50% counseling and coordination of care  Tangie Stay P, MD   03/01/2015, 7:35 PM

## 2015-03-02 DIAGNOSIS — N390 Urinary tract infection, site not specified: Secondary | ICD-10-CM | POA: Insufficient documentation

## 2015-03-02 LAB — URINE CULTURE

## 2015-03-03 ENCOUNTER — Telehealth: Payer: Self-pay

## 2015-03-03 NOTE — Telephone Encounter (Addendum)
Jessica Payne states that she took Miralax bid on 4-11 and 4-12.  She had good results x 2 yesterday.  No BM today.  Her  Abdomen is still uncomfortable.  She feels that it may be gas pain. Suggested that she take Miralax now nad see if bowels move more as well and expelling more gas.  Will follow up with her tomorrow as she wanted to wait a day and see if there is more improvement in discomfort before getting US/ paracentesis.  Has treatment tomorrow at 1000. She is afebrile.  She continues to have  cough that she mentioned at visit 03-01-15.  Sl. Production of clear phlegm.  It is beginning to interfere with her sleep.  She has some Tessalon Pearls on hand and told her that she could use 1 every 8 hrs prn. She began Claritin after 03-01-15 visit.  Told her that the urine culture from 03-01-15 did not show any infection per Dr. Marko Plume.

## 2015-03-03 NOTE — Telephone Encounter (Signed)
-----   Message from Gordy Levan, MD sent at 03/02/2015  8:57 PM EDT ----- Constipation and abd discomfort at visit 4-11. Please check on her by phone to see if bowels doing better. If still much abdominal discomfort, please get US abdomen with therapeutic paracentesis if much ascites again.  She is for chemo on 4-14  thanks

## 2015-03-04 ENCOUNTER — Encounter: Payer: Self-pay | Admitting: Oncology

## 2015-03-04 ENCOUNTER — Ambulatory Visit (HOSPITAL_BASED_OUTPATIENT_CLINIC_OR_DEPARTMENT_OTHER): Payer: 59

## 2015-03-04 ENCOUNTER — Other Ambulatory Visit: Payer: Self-pay | Admitting: Oncology

## 2015-03-04 ENCOUNTER — Other Ambulatory Visit (HOSPITAL_BASED_OUTPATIENT_CLINIC_OR_DEPARTMENT_OTHER): Payer: 59

## 2015-03-04 ENCOUNTER — Other Ambulatory Visit (HOSPITAL_COMMUNITY)
Admission: AD | Admit: 2015-03-04 | Discharge: 2015-03-04 | Disposition: A | Discharge status: Home / Self Care | Payer: 59 | Source: Ambulatory Visit | Attending: Oncology | Admitting: Oncology

## 2015-03-04 VITALS — BP 151/78 | HR 112 | Temp 97.4°F | Resp 20

## 2015-03-04 DIAGNOSIS — C541 Malignant neoplasm of endometrium: Secondary | ICD-10-CM

## 2015-03-04 DIAGNOSIS — C482 Malignant neoplasm of peritoneum, unspecified: Secondary | ICD-10-CM

## 2015-03-04 DIAGNOSIS — Z5189 Encounter for other specified aftercare: Secondary | ICD-10-CM | POA: Diagnosis not present

## 2015-03-04 DIAGNOSIS — C55 Malignant neoplasm of uterus, part unspecified: Secondary | ICD-10-CM

## 2015-03-04 LAB — CBC WITH DIFFERENTIAL/PLATELET
BASO%: 0 % (ref 0.0–2.0)
Basophils Absolute: 0 10*3/uL (ref 0.0–0.1)
EOS%: 0 % (ref 0.0–7.0)
Eosinophils Absolute: 0 10*3/uL (ref 0.0–0.5)
HCT: 27.2 % — ABNORMAL LOW (ref 34.8–46.6)
HGB: 9.3 g/dL — ABNORMAL LOW (ref 11.6–15.9)
LYMPH#: 0.4 10*3/uL — AB (ref 0.9–3.3)
LYMPH%: 22.8 % (ref 14.0–49.7)
MCH: 28.6 pg (ref 25.1–34.0)
MCHC: 34.2 g/dL (ref 31.5–36.0)
MCV: 83.7 fL (ref 79.5–101.0)
MONO#: 0 10*3/uL — ABNORMAL LOW (ref 0.1–0.9)
MONO%: 1.2 % (ref 0.0–14.0)
NEUT#: 1.3 10*3/uL — ABNORMAL LOW (ref 1.5–6.5)
NEUT%: 76 % (ref 38.4–76.8)
Platelets: 250 10*3/uL (ref 145–400)
RBC: 3.25 10*6/uL — ABNORMAL LOW (ref 3.70–5.45)
RDW: 14.8 % — ABNORMAL HIGH (ref 11.2–14.5)
WBC: 1.7 10*3/uL — ABNORMAL LOW (ref 3.9–10.3)
nRBC: 0 % (ref 0–0)

## 2015-03-04 MED ORDER — TBO-FILGRASTIM 300 MCG/0.5ML ~~LOC~~ SOSY
300.0000 ug | PREFILLED_SYRINGE | Freq: Once | SUBCUTANEOUS | Status: AC
  Administered 2015-03-04: 300 ug via SUBCUTANEOUS
  Filled 2015-03-04: qty 0.5

## 2015-03-04 NOTE — Telephone Encounter (Signed)
Ms. Raffety stated that her bowels are moving better today and her abdomen is much more comfortable today.

## 2015-03-04 NOTE — Patient Instructions (Signed)
Neutropenia Neutropenia is a condition that occurs when the level of a certain type of white blood cell (neutrophil) in your body becomes lower than normal. Neutrophils are made in the bone marrow and fight infections. These cells protect against bacteria and viruses. The fewer neutrophils you have, and the longer your body remains without them, the greater your risk of getting a severe infection becomes. CAUSES  The cause of neutropenia may be hard to determine. However, it is usually due to 3 main problems:   Decreased production of neutrophils. This may be due to:  Certain medicines such as chemotherapy.  Genetic problems.  Cancer.  Radiation treatments.  Vitamin deficiency.  Some pesticides.  Increased destruction of neutrophils. This may be due to:  Overwhelming infections.  Hemolytic anemia. This is when the body destroys its own blood cells.  Chemotherapy.  Neutrophils moving to areas of the body where they cannot fight infections. This may be due to:  Dialysis procedures.  Conditions where the spleen becomes enlarged. Neutrophils are held in the spleen and are not available to the rest of the body.  Overwhelming infections. The neutrophils are held in the area of the infection and are not available to the rest of the body. SYMPTOMS  There are no specific symptoms of neutropenia. The lack of neutrophils can result in an infection, and an infection can cause various problems. DIAGNOSIS  Diagnosis is made by a blood test. A complete blood count is performed. The normal level of neutrophils in human blood differs with age and race. Infants have lower counts than older children and adults. African Americans have lower counts than Caucasians or Asians. The average adult level is 1500 cells/mm3 of blood. Neutrophil counts are interpreted as follows:  Greater than 1000 cells/mm3 gives normal protection against infection.  500 to 1000 cells/mm3 gives an increased risk for  infection.  200 to 500 cells/mm3 is a greater risk for severe infection.  Lower than 200 cells/mm3 is a marked risk of infection. This may require hospitalization and treatment with antibiotic medicines. TREATMENT  Treatment depends on the underlying cause, severity, and presence of infections or symptoms. It also depends on your health. Your caregiver will discuss the treatment plan with you. Mild cases are often easily treated and have a good outcome. Preventative measures may also be started to limit your risk of infections. Treatment can include:  Taking antibiotics.  Stopping medicines that are known to cause neutropenia.  Correcting nutritional deficiencies by eating green vegetables to supply folic acid and taking vitamin B supplements.  Stopping exposure to pesticides if your neutropenia is related to pesticide exposure.  Taking a blood growth factor called sargramostim, pegfilgrastim, or filgrastim if you are undergoing chemotherapy for cancer. This stimulates white blood cell production.  Removal of the spleen if you have Felty's syndrome and have repeated infections. HOME CARE INSTRUCTIONS   Follow your caregiver's instructions about when you need to have blood work done.  Wash your hands often. Make sure others who come in contact with you also wash their hands.  Wash raw fruits and vegetables before eating them. They can carry bacteria and fungi.  Avoid people with colds or spreadable (contagious) diseases (chickenpox, herpes zoster, influenza).  Avoid large crowds.  Avoid construction areas. The dust can release fungus into the air.  Be cautious around children in daycare or school environments.  Take care of your respiratory system by coughing and deep breathing.  Bathe daily.  Protect your skin from cuts and   burns.  Do not work in the garden or with flowers and plants.  Care for the mouth before and after meals by brushing with a soft toothbrush. If you have  mucositis, do not use mouthwash. Mouthwash contains alcohol and can dry out the mouth even more.  Clean the area between the genitals and the anus (perineal area) after urination and bowel movements. Women need to wipe from front to back.  Use a water soluble lubricant during sexual intercourse and practice good hygiene after. Do not have intercourse if you are severely neutropenic. Check with your caregiver for guidelines.  Exercise daily as tolerated.  Avoid people who were vaccinated with a live vaccine in the past 30 days. You should not receive live vaccines (polio, typhoid).  Do not provide direct care for pets. Avoid animal droppings. Do not clean litter boxes and bird cages.  Do not share food utensils.  Do not use tampons, enemas, or rectal suppositories unless directed by your caregiver.  Use an electric razor to remove hair.  Wash your hands after handling magazines, letters, and newspapers. SEEK IMMEDIATE MEDICAL CARE IF:   You have a fever.  You have chills or start to shake.  You feel nauseous or vomit.  You develop mouth sores.  You develop aches and pains.  You have redness and swelling around open wounds.  Your skin is warm to the touch.  You have pus coming from your wounds.  You develop swollen lymph nodes.  You feel weak or fatigued.  You develop red streaks on the skin. MAKE SURE YOU:  Understand these instructions.  Will watch your condition.  Will get help right away if you are not doing well or get worse. Document Released: 04/28/2002 Document Revised: 01/29/2012 Document Reviewed: 05/26/2011 Exit[employer] Patient Information 2015 [employer]. This information is not intended to replace advice given to you by your health care provider. Make sure you discuss any questions you have with your health care provider.  

## 2015-03-04 NOTE — Progress Notes (Signed)
S/w Dr Marko Plume about wbc1.7 and anc 1.3, will hold chemo this week, will give granix today and tomorrow. Will plan to continue chemo next week as scheduled. Pt is aware of plan. Discussed neutropenic precautions w/pt and sister.

## 2015-03-04 NOTE — Progress Notes (Signed)
Medical Oncology  Patient here for day 8 cycle 2, however ANC now 1.3; Hb a little lower at 9.3, platelets good at 250.  Per RN, patient is feeling a little better, with energy and appetite both a little better. WIll hold day 8 taxol today. Will give granix today and 4-15.  I will see her with day 15 cycle 2 on 4-21 and she will call prior if needed. Note may need 2 doses granix after day 1 from here.  L.Livesay

## 2015-03-05 ENCOUNTER — Ambulatory Visit (HOSPITAL_BASED_OUTPATIENT_CLINIC_OR_DEPARTMENT_OTHER): Payer: 59

## 2015-03-05 VITALS — BP 143/63 | HR 98 | Temp 98.0°F

## 2015-03-05 DIAGNOSIS — C482 Malignant neoplasm of peritoneum, unspecified: Secondary | ICD-10-CM | POA: Diagnosis not present

## 2015-03-05 DIAGNOSIS — C541 Malignant neoplasm of endometrium: Secondary | ICD-10-CM

## 2015-03-05 DIAGNOSIS — Z5189 Encounter for other specified aftercare: Secondary | ICD-10-CM | POA: Diagnosis not present

## 2015-03-05 MED ORDER — TBO-FILGRASTIM 300 MCG/0.5ML ~~LOC~~ SOSY
300.0000 ug | PREFILLED_SYRINGE | Freq: Once | SUBCUTANEOUS | Status: AC
  Administered 2015-03-05: 300 ug via SUBCUTANEOUS
  Filled 2015-03-05: qty 0.5

## 2015-03-07 ENCOUNTER — Other Ambulatory Visit: Payer: Self-pay | Admitting: Oncology

## 2015-03-07 DIAGNOSIS — D6481 Anemia due to antineoplastic chemotherapy: Secondary | ICD-10-CM

## 2015-03-07 DIAGNOSIS — T451X5A Adverse effect of antineoplastic and immunosuppressive drugs, initial encounter: Principal | ICD-10-CM

## 2015-03-10 ENCOUNTER — Other Ambulatory Visit (HOSPITAL_BASED_OUTPATIENT_CLINIC_OR_DEPARTMENT_OTHER): Payer: 59

## 2015-03-10 ENCOUNTER — Encounter: Payer: Self-pay | Admitting: Nurse Practitioner

## 2015-03-10 ENCOUNTER — Telehealth: Payer: Self-pay

## 2015-03-10 ENCOUNTER — Ambulatory Visit (HOSPITAL_BASED_OUTPATIENT_CLINIC_OR_DEPARTMENT_OTHER): Payer: 59 | Admitting: Nurse Practitioner

## 2015-03-10 ENCOUNTER — Other Ambulatory Visit: Payer: Self-pay | Admitting: Oncology

## 2015-03-10 ENCOUNTER — Ambulatory Visit (HOSPITAL_COMMUNITY)
Admission: RE | Admit: 2015-03-10 | Discharge: 2015-03-10 | Disposition: A | Discharge status: Home / Self Care | Payer: 59 | Source: Ambulatory Visit | Attending: Nurse Practitioner | Admitting: Nurse Practitioner

## 2015-03-10 VITALS — BP 154/74 | HR 99 | Temp 98.9°F | Resp 18 | Wt 170.9 lb

## 2015-03-10 DIAGNOSIS — C55 Malignant neoplasm of uterus, part unspecified: Secondary | ICD-10-CM

## 2015-03-10 DIAGNOSIS — R509 Fever, unspecified: Secondary | ICD-10-CM | POA: Insufficient documentation

## 2015-03-10 DIAGNOSIS — K209 Esophagitis, unspecified without bleeding: Secondary | ICD-10-CM | POA: Insufficient documentation

## 2015-03-10 DIAGNOSIS — C482 Malignant neoplasm of peritoneum, unspecified: Secondary | ICD-10-CM

## 2015-03-10 DIAGNOSIS — C541 Malignant neoplasm of endometrium: Secondary | ICD-10-CM | POA: Diagnosis not present

## 2015-03-10 DIAGNOSIS — T451X5A Adverse effect of antineoplastic and immunosuppressive drugs, initial encounter: Secondary | ICD-10-CM

## 2015-03-10 DIAGNOSIS — R05 Cough: Secondary | ICD-10-CM | POA: Diagnosis present

## 2015-03-10 DIAGNOSIS — D701 Agranulocytosis secondary to cancer chemotherapy: Secondary | ICD-10-CM

## 2015-03-10 DIAGNOSIS — D6481 Anemia due to antineoplastic chemotherapy: Secondary | ICD-10-CM

## 2015-03-10 DIAGNOSIS — K0381 Cracked tooth: Secondary | ICD-10-CM | POA: Diagnosis not present

## 2015-03-10 LAB — CBC WITH DIFFERENTIAL/PLATELET
BASO%: 0.5 % (ref 0.0–2.0)
Basophils Absolute: 0 10*3/uL (ref 0.0–0.1)
EOS%: 0.3 % (ref 0.0–7.0)
Eosinophils Absolute: 0 10*3/uL (ref 0.0–0.5)
HCT: 27.4 % — ABNORMAL LOW (ref 34.8–46.6)
HGB: 9.2 g/dL — ABNORMAL LOW (ref 11.6–15.9)
LYMPH%: 27.9 % (ref 14.0–49.7)
MCH: 28.8 pg (ref 25.1–34.0)
MCHC: 33.6 g/dL (ref 31.5–36.0)
MCV: 85.7 fL (ref 79.5–101.0)
MONO#: 1.9 10*3/uL — ABNORMAL HIGH (ref 0.1–0.9)
MONO%: 25.4 % — ABNORMAL HIGH (ref 0.0–14.0)
NEUT#: 3.5 10*3/uL (ref 1.5–6.5)
NEUT%: 45.9 % (ref 38.4–76.8)
PLATELETS: 214 10*3/uL (ref 145–400)
RBC: 3.2 10*6/uL — AB (ref 3.70–5.45)
RDW: 17.3 % — ABNORMAL HIGH (ref 11.2–14.5)
WBC: 7.6 10*3/uL (ref 3.9–10.3)
lymph#: 2.1 10*3/uL (ref 0.9–3.3)

## 2015-03-10 LAB — IRON AND TIBC CHCC
%SAT: 11 % — ABNORMAL LOW (ref 21–57)
Iron: 28 ug/dL — ABNORMAL LOW (ref 41–142)
TIBC: 249 ug/dL (ref 236–444)
UIBC: 221 ug/dL (ref 120–384)

## 2015-03-10 LAB — COMPREHENSIVE METABOLIC PANEL (CC13)
ALT: 20 U/L (ref 0–55)
AST: 17 U/L (ref 5–34)
Albumin: 2.8 g/dL — ABNORMAL LOW (ref 3.5–5.0)
Alkaline Phosphatase: 132 U/L (ref 40–150)
Anion Gap: 15 meq/L — ABNORMAL HIGH (ref 3–11)
BUN: 7.4 mg/dL (ref 7.0–26.0)
CO2: 26 meq/L (ref 22–29)
Calcium: 8.6 mg/dL (ref 8.4–10.4)
Chloride: 98 meq/L (ref 98–109)
Creatinine: 0.6 mg/dL (ref 0.6–1.1)
EGFR: >90 ml/min/1.73 m2
Glucose: 112 mg/dL (ref 70–140)
Potassium: 3.6 meq/L (ref 3.5–5.1)
Sodium: 139 meq/L (ref 136–145)
Total Bilirubin: 0.34 mg/dL (ref 0.20–1.20)
Total Protein: 6.6 g/dL (ref 6.4–8.3)

## 2015-03-10 MED ORDER — BENZONATATE 100 MG PO CAPS: 200.0000 mg | ORAL_CAPSULE | Freq: Three times a day (TID) | ORAL | Status: DC | PRN

## 2015-03-10 MED ORDER — DOXYCYCLINE HYCLATE 50 MG PO CAPS: 50.0000 mg | ORAL_CAPSULE | Freq: Two times a day (BID) | ORAL | Status: DC

## 2015-03-10 MED ORDER — SUCRALFATE 1 G PO TABS: 1.0000 g | ORAL_TABLET | Freq: Three times a day (TID) | ORAL | Status: DC

## 2015-03-10 NOTE — Assessment & Plan Note (Signed)
Patient is complaining of some mild, intermittent esophagitis.  The stomach, this is secondary to chemotherapy.  Patient was advised to switch to Prilosec on a daily basis.  Also prescribed Carafate for the patient.

## 2015-03-10 NOTE — Progress Notes (Signed)
SYMPTOM MANAGEMENT CLINIC   HPI: Jessica Payne 64 y.o. female diagnosed with uterine cancer and peritoneal adenocarcinoma.  [place] patient is status post tumor debulking surgery on 06/23/2019 third 2016.  She is currently undergoing Taxol/carboplatin chemotherapy.  Patient reports a fever to maximum 100.3 within the past 24 hours.  She is also complaining of a cough only productive of clear secretions.  She denies any URI symptoms whatsoever.  She denies a sore throat.  She also denies any chest pain, chest pressure, shortness breath, or pain with inspiration.  Patient has a tooth to the left lower gumline that is partially broken to the gumline.  She has no tenderness, erythema, edema, or bleeding to the surrounding area of this tooth.  Pt reports that her surgical site has healed well.  She states that she has not had a paracentesis since March 2016; prior to her surgery.  She states that her abdomen is only slightly firm at this point; and she is not feeling short of breath or bloated. HPI  ROS  Past Medical History  Diagnosis Date  . Hypertension   . Acute peritonitis   . Ascites   . Low TSH level   . Hypothyroidism     1981   . Hyperthyroidism     03/2014   . GERD (gastroesophageal reflux disease)   . Headache     occasional migraine     Past Surgical History  Procedure Laterality Date  . Thyroidectomy    . Culposcopy     . Laparotomy N/A 01/12/2015    Procedure: EXPLORATORY LAPAROTOMY, RADICAL DEBULKING;  Surgeon: Everitt Amber, MD;  Location: [facility];  Service: Gynecology;  Laterality: N/A;  . Abdominal hysterectomy N/A 01/12/2015    Procedure: TOTAL HYSTERECTOMY ABDOMINAL;  Surgeon: Everitt Amber, MD;  Location: [facility];  Service: Gynecology;  Laterality: N/A;  . Salpingoophorectomy Bilateral 01/12/2015    Procedure: BILATERAL SALPINGO OOPHORECTOMY;  Surgeon: Everitt Amber, MD;  Location: [facility];  Service: Gynecology;  Laterality: Bilateral;  . Omentectomy N/A 01/12/2015   Procedure: OMENTECTOMY;  Surgeon: Everitt Amber, MD;  Location: [facility];  Service: Gynecology;  Laterality: N/A;    has Malignant ascites; Postmenopausal bleeding; Cervical stenosis (uterine cervix); Uterine cancer; Primary peritoneal adenocarcinoma; History of tobacco abuse; H/O partial thyroidectomy; Broken or cracked tooth, nontraumatic; Constipation - functional; Esophageal reflux; Chemotherapy-induced nausea; Urinary frequency; Bacterial UTI; UTI (lower urinary tract infection); Fever; Esophagitis; and Chemotherapy induced neutropenia on her problem list.    is allergic to ciprofloxacin; merthiolate; penicillins; and tramadol.    Medication List       This list is accurate as of: 03/10/15  4:54 PM.  Always use your most recent med list.               benzonatate 100 MG capsule  Commonly known as:  TESSALON  Take 2 capsules (200 mg total) by mouth 3 (three) times daily as needed for cough.     dexamethasone 4 MG tablet  Commonly known as:  DECADRON  Take 5 tabs with food 12 hrs and 6 hrs prior to taxol chemo or as directed.     docusate sodium 100 MG capsule  Commonly known as:  COLACE  Take 100 mg by mouth daily.     doxycycline 50 MG capsule  Commonly known as:  VIBRAMYCIN  Take 1 capsule (50 mg total) by mouth 2 (two) times daily.     famotidine 20 MG tablet  Commonly known as:  PEPCID  Take 20 mg by mouth at bedtime. As needed     ferrous fumarate 325 (106 FE) MG Tabs tablet  Commonly known as:  HEMOCYTE  Take 1 tablet on an empty stomach with OJ or Vitamin C 500 mg tab.     lisinopril 10 MG tablet  Commonly known as:  PRINIVIL,ZESTRIL  Take 10 mg by mouth at bedtime.     loratadine 10 MG tablet  Commonly known as:  CLARITIN  Take 10 mg by mouth daily.     LORazepam 1 MG tablet  Commonly known as:  ATIVAN  Place 1/2-1 tablet under the tongue or swallow every 6 hrs as needed for nausea.  Will make drowsy     naproxen sodium 220 MG tablet  Commonly known as:   ANAPROX  Take 440 mg by mouth 2 (two) times daily as needed (for pain).     ondansetron 8 MG tablet  Commonly known as:  ZOFRAN  Take 1 tablet (8 mg total) by mouth every 8 (eight) hours as needed for nausea or vomiting.     oxyCODONE 5 MG immediate release tablet  Commonly known as:  Oxy IR/ROXICODONE  Take 1 tablet (5 mg total) by mouth every 4 (four) hours as needed for moderate pain or breakthrough pain.     polyethylene glycol powder powder  Commonly known as:  GLYCOLAX/MIRALAX  Take 1 Container by mouth as needed.     promethazine 25 MG suppository  Commonly known as:  PHENERGAN  Place 1 suppository (25 mg total) rectally every 6 (six) hours as needed for nausea or vomiting (NO NOT USE SUPPOSITORY IF  WHITE BLOOD COUNT IS LOW   Will Make drowsy).     sennosides-docusate sodium 8.6-50 MG tablet  Commonly known as:  SENOKOT-S  Take 1 tablet by mouth 2 (two) times daily.     sucralfate 1 G tablet  Commonly known as:  CARAFATE  Take 1 tablet (1 g total) by mouth 4 (four) times daily -  with meals and at bedtime.         PHYSICAL EXAMINATION  Oncology Vitals 03/10/2015 03/05/2015 03/04/2015 03/01/2015 02/26/2015 02/25/2015 02/18/2015  Height - - - 170 cm - - -  Weight 77.52 kg - - 77.111 kg - - -  Weight (lbs) 170 lbs 14 oz - - 170 lbs - - -  BMI (kg/m2) - - - 26.63 kg/m2 - - -  Temp 98.9 98 97.4 98 98.2 97.6 98.6  Pulse 99 98 112 69 99 93 104  Resp 18 - 20 18 - 20 18  SpO2 99 - - - - 100 97  BSA (m2) - - - 1.91 m2 - - -   BP Readings from Last 3 Encounters:  03/10/15 154/74  03/05/15 143/63  03/04/15 151/78    Physical Exam  Constitutional: She is oriented to person, place, and time. Vital signs are normal. She appears unhealthy.  HENT:  Head: Normocephalic and atraumatic.  Nose: Nose normal.  Mouth/Throat: Oropharynx is clear and moist.  Oropharynx clear; with no erythema or exudate.  Left lower back tooth appears partially broken off down to the gumline.  There is  no surrounding erythema, edema, tenderness, or bleeding.  No nasal congestion on exam.  No facial tenderness with palpation.  Eyes: Conjunctivae and EOM are normal. Pupils are equal, round, and reactive to light.  Neck: Normal range of motion. Neck supple. No JVD present. No tracheal deviation present. No thyromegaly present.  Cardiovascular: Normal  rate, regular rhythm, normal heart sounds and intact distal pulses.   Pulmonary/Chest: Effort normal and breath sounds normal. No respiratory distress. She has no wheezes. She has no rales. She exhibits no tenderness.  Abdominal: Soft. Bowel sounds are normal. She exhibits no distension and no mass. There is no tenderness. There is no rebound and no guarding.  Lateral, midline abdominal incision well-healed.  Abdomen is slightly firm; but no distention.  Bowel sounds positive in all 4 quads.  No tenderness with palpation.  No obvious ascites.  Musculoskeletal: Normal range of motion. She exhibits no edema or tenderness.  Lymphadenopathy:    She has no cervical adenopathy.  Neurological: She is alert and oriented to person, place, and time. Gait normal.  Skin: Skin is warm and dry. No rash noted. No erythema. There is pallor.  Psychiatric: Affect normal.  Nursing note and vitals reviewed.   LABORATORY DATA:. Appointment on 03/10/2015  Component Date Value Ref Range Status  . WBC 03/10/2015 7.6  3.9 - 10.3 10e3/uL Final  . NEUT# 03/10/2015 3.5  1.5 - 6.5 10e3/uL Final  . HGB 03/10/2015 9.2* 11.6 - 15.9 g/dL Final  . HCT 03/10/2015 27.4* 34.8 - 46.6 % Final  . Platelets 03/10/2015 214  145 - 400 10e3/uL Final  . MCV 03/10/2015 85.7  79.5 - 101.0 fL Final  . MCH 03/10/2015 28.8  25.1 - 34.0 pg Final  . MCHC 03/10/2015 33.6  31.5 - 36.0 g/dL Final  . RBC 03/10/2015 3.20* 3.70 - 5.45 10e6/uL Final  . RDW 03/10/2015 17.3* 11.2 - 14.5 % Final  . lymph# 03/10/2015 2.1  0.9 - 3.3 10e3/uL Final  . MONO# 03/10/2015 1.9* 0.1 - 0.9 10e3/uL Final  .  Eosinophils Absolute 03/10/2015 0.0  0.0 - 0.5 10e3/uL Final  . Basophils Absolute 03/10/2015 0.0  0.0 - 0.1 10e3/uL Final  . NEUT% 03/10/2015 45.9  38.4 - 76.8 % Final  . LYMPH% 03/10/2015 27.9  14.0 - 49.7 % Final  . MONO% 03/10/2015 25.4* 0.0 - 14.0 % Final  . EOS% 03/10/2015 0.3  0.0 - 7.0 % Final  . BASO% 03/10/2015 0.5  0.0 - 2.0 % Final  . Sodium 03/10/2015 139  136 - 145 mEq/L Final  . Potassium 03/10/2015 3.6  3.5 - 5.1 mEq/L Final  . Chloride 03/10/2015 98  98 - 109 mEq/L Final  . CO2 03/10/2015 26  22 - 29 mEq/L Final  . Glucose 03/10/2015 112  70 - 140 mg/dl Final  . BUN 03/10/2015 7.4  7.0 - 26.0 mg/dL Final  . Creatinine 03/10/2015 0.6  0.6 - 1.1 mg/dL Final  . Total Bilirubin 03/10/2015 0.34  0.20 - 1.20 mg/dL Final  . Alkaline Phosphatase 03/10/2015 132  40 - 150 U/L Final  . AST 03/10/2015 17  5 - 34 U/L Final  . ALT 03/10/2015 20  0 - 55 U/L Final  . Total Protein 03/10/2015 6.6  6.4 - 8.3 g/dL Final  . Albumin 03/10/2015 2.8* 3.5 - 5.0 g/dL Final  . Calcium 03/10/2015 8.6  8.4 - 10.4 mg/dL Final  . Anion Gap 03/10/2015 15* 3 - 11 mEq/L Final  . EGFR 03/10/2015 >90  >90 ml/min/1.73 m2 Final   eGFR is calculated using the CKD-EPI Creatinine Equation (2009)  . Iron 03/10/2015 28* 41 - 142 ug/dL Final  . TIBC 03/10/2015 249  236 - 444 ug/dL Final  . UIBC 03/10/2015 221  120 - 384 ug/dL Final  . %SAT 03/10/2015 11* 21 - 57 %  Final     RADIOGRAPHIC STUDIES: Dg Chest 2 View  03/10/2015   CLINICAL DATA:  64 year old female with fever and productive cough. Current history uterine cancer undergoing chemotherapy. Initial encounter.  EXAM: CHEST  2 VIEW  COMPARISON:  01/07/2015.  FINDINGS: Stable lung volumes. Stable cardiac size and mediastinal contours. No pneumothorax, pulmonary edema, pleural effusion or consolidation. No confluent pulmonary opacity. No acute osseous abnormality identified. Negative visible bowel gas pattern.  Stable mild rightward deviation of the trachea  at the thoracic inlet suggesting left thyroid goiter.  IMPRESSION: No acute cardiopulmonary abnormality.   Electronically Signed   By: Genevie Ann M.D.   On: 03/10/2015 14:51    ASSESSMENT/PLAN:    Uterine cancer Patient's Taxol/carboplatin chemotherapy was held on 03/04/2015 due to neutropenia with ANC down to 1.3.  Patient was instead given Granix injection 2 for growth factor support.  ANC has now recovered and is currently 3.5.  Patient is scheduled to return tomorrow 03/11/2015 for her planned chemotherapy.  After consulting with Dr. Arnetha Courser plan is for the patient to return tomorrow for a follow-up visit with Dr. Marko Plume prior to making the decision regarding chemotherapy tomorrow.  Patient is afebrile now with temperature 98.9.  Will prescribe doxycycline prophylactically since patient does report a fever within the last 24 hours up to maximum 100.3.  Fever differentials include a viral or bacterial illness; or a possible dental issue.  Also, patient was advised to hold all previously prescribed dexamethasone for today.   Broken or cracked tooth, nontraumatic Patient's left lower back tooth is partially broken off to the gumline.  There is no tenderness/sensitivity to the tooth or surrounding gum area.  Also, there is no erythema, edema, or bleeding to the site.  Since patient is reporting a fever to maximum 100.3 with the past 24 hours; will prophylactically prescribe doxycycline antibiotics.  Advised patient to call her dentist as soon as possible to arrange for repair and/or extraction of tooth.   Fever Patient reports a fever to maximum 100.3 within the past 24 hours.  She is also complaining of a cough only productive of clear secretions.  She denies any URI symptoms whatsoever.  She denies a sore throat.  She also denies any chest pain, chest pressure, shortness breath, or pain with inspiration.  Patient has a tooth to the left lower gumline that is partially broken to the  gumline.  She has no tenderness, erythema, edema, or bleeding to the surrounding area of this tooth.  Temperature while at the cancer center was 98.9.  Patient appears nontoxic.  Breath sounds clear in all lung fields; with no wheeze or respiratory distress.  There is no nasal congestion or other URI symptoms.  Oropharynx is clear with no erythema or exudate.  Chest x-ray obtained today was negative for any acute findings.  Will prescribe doxycycline antibiotics which will cover any bacterial infection-including any dental infection.  Patient was advised to try over-the-counter antihistamine such as Claritin, Allegra, or Zyrtec.  Patient was also encouraged to call her dentist as soon as possible for repair or extraction of the broken tooth.   Esophagitis Patient is complaining of some mild, intermittent esophagitis.  The stomach, this is secondary to chemotherapy.  Patient was advised to switch to Prilosec on a daily basis.  Also prescribed Carafate for the patient.   Chemotherapy induced neutropenia Chemotherapy was held last week due to chemotherapy and-induced neutropenia with an ANC of 1.3.  Patient received 2 days of Granix injections  last week.  ANC is improved now to 3.5.    Decision will be made tomorrow when patient returns for follow-up visit regarding chemotherapy recently planned for tomorrow.    Patient stated understanding of all instructions; and was in agreement with this plan of care. The patient knows to call the clinic with any problems, questions or concerns.   Review/collaboration with Dr. Marko Plume regarding all aspects of patient's visit today.   Total time spent with patient was 40 minutes;  with greater than 75 percent of that time spent in face to face counseling regarding patient's symptoms,  and coordination of care and follow up.  Disclaimer: This note was dictated with voice recognition software. Similar sounding words can inadvertently be transcribed and may  not be corrected upon review.   [name], NP 03/10/2015

## 2015-03-10 NOTE — Telephone Encounter (Signed)
Pt called stating she had temp of 100, requesting advice.

## 2015-03-10 NOTE — Assessment & Plan Note (Signed)
Patient's left lower back tooth is partially broken off to the gumline.  There is no tenderness/sensitivity to the tooth or surrounding gum area.  Also, there is no erythema, edema, or bleeding to the site.  Since patient is reporting a fever to maximum 100.3 with the past 24 hours; will prophylactically prescribe doxycycline antibiotics.  Advised patient to call her dentist as soon as possible to arrange for repair and/or extraction of tooth.

## 2015-03-10 NOTE — Assessment & Plan Note (Addendum)
Patient reports a fever to maximum 100.3 within the past 24 hours.  She is also complaining of a cough only productive of clear secretions.  She denies any URI symptoms whatsoever.  She denies a sore throat.  She also denies any chest pain, chest pressure, shortness breath, or pain with inspiration.  Patient has a tooth to the left lower gumline that is partially broken to the gumline.  She has no tenderness, erythema, edema, or bleeding to the surrounding area of this tooth.  Temperature while at the cancer center was 98.9.  Patient appears nontoxic.  Breath sounds clear in all lung fields; with no wheeze or respiratory distress.  There is no nasal congestion or other URI symptoms.  Oropharynx is clear with no erythema or exudate.  Chest x-ray obtained today was negative for any acute findings.  Will prescribe doxycycline antibiotics which will cover any bacterial infection-including any dental infection.  Patient was advised to try over-the-counter antihistamine such as Claritin, Allegra, or Zyrtec.  Patient was also encouraged to call her dentist as soon as possible for repair or extraction of the broken tooth.

## 2015-03-10 NOTE — Telephone Encounter (Signed)
Monday felt like no energy. Has had times when she is cold. Woke up in wet sheets yesterday. Yesterday eve about 6 pm temp was 100.3. No chills or fevers today. Feels pretty good today.  Mildly productive cough of clear sputum. No n/v, no diarrhea/constipation, mild body aches from neulasta shot. Temp 99 at present. S/w Louise and pt  will have lab and [facility] today. We can advise her about chemo tomorrow at that time. Barbaraann Share also mentioned pt has a broken tooth. Pt stated it was not hurting today. Had taxol and carbo on 4/7, held chemo and had granix on 4/14 and 4/15.

## 2015-03-10 NOTE — Assessment & Plan Note (Addendum)
Patient's Taxol/carboplatin chemotherapy was held on 03/04/2015 due to neutropenia with ANC down to 1.3.  Patient was instead given Granix injection 2 for growth factor support.  ANC has now recovered and is currently 3.5.  Patient is scheduled to return tomorrow 03/11/2015 for her planned chemotherapy.  After consulting with Dr. Arnetha Courser plan is for the patient to return tomorrow for a follow-up visit with Dr. Marko Plume prior to making the decision regarding chemotherapy tomorrow.  Patient is afebrile now with temperature 98.9.  Will prescribe doxycycline prophylactically since patient does report a fever within the last 24 hours up to maximum 100.3.  Fever differentials include a viral or bacterial illness; or a possible dental issue.  Also, patient was advised to hold all previously prescribed dexamethasone for today.

## 2015-03-10 NOTE — Assessment & Plan Note (Signed)
Chemotherapy was held last week due to chemotherapy and-induced neutropenia with an ANC of 1.3.  Patient received 2 days of Granix injections last week.  ANC is improved now to 3.5.    Decision will be made tomorrow when patient returns for follow-up visit regarding chemotherapy recently planned for tomorrow.

## 2015-03-11 ENCOUNTER — Ambulatory Visit (HOSPITAL_BASED_OUTPATIENT_CLINIC_OR_DEPARTMENT_OTHER): Payer: 59 | Admitting: Oncology

## 2015-03-11 ENCOUNTER — Ambulatory Visit (HOSPITAL_BASED_OUTPATIENT_CLINIC_OR_DEPARTMENT_OTHER): Payer: 59

## 2015-03-11 ENCOUNTER — Telehealth: Payer: Self-pay | Admitting: Oncology

## 2015-03-11 ENCOUNTER — Encounter: Payer: Self-pay | Admitting: Oncology

## 2015-03-11 ENCOUNTER — Other Ambulatory Visit: Payer: 59

## 2015-03-11 VITALS — BP 152/78 | HR 100 | Temp 98.0°F | Resp 18 | Ht 67.0 in | Wt 170.0 lb

## 2015-03-11 DIAGNOSIS — K0381 Cracked tooth: Secondary | ICD-10-CM | POA: Diagnosis not present

## 2015-03-11 DIAGNOSIS — K121 Other forms of stomatitis: Secondary | ICD-10-CM | POA: Diagnosis not present

## 2015-03-11 DIAGNOSIS — D6481 Anemia due to antineoplastic chemotherapy: Secondary | ICD-10-CM

## 2015-03-11 DIAGNOSIS — D509 Iron deficiency anemia, unspecified: Secondary | ICD-10-CM

## 2015-03-11 DIAGNOSIS — T451X5A Adverse effect of antineoplastic and immunosuppressive drugs, initial encounter: Secondary | ICD-10-CM

## 2015-03-11 DIAGNOSIS — C482 Malignant neoplasm of peritoneum, unspecified: Secondary | ICD-10-CM | POA: Diagnosis not present

## 2015-03-11 DIAGNOSIS — R509 Fever, unspecified: Secondary | ICD-10-CM

## 2015-03-11 DIAGNOSIS — Z5111 Encounter for antineoplastic chemotherapy: Secondary | ICD-10-CM | POA: Diagnosis not present

## 2015-03-11 DIAGNOSIS — D701 Agranulocytosis secondary to cancer chemotherapy: Secondary | ICD-10-CM

## 2015-03-11 MED ORDER — DIPHENHYDRAMINE HCL 50 MG/ML IJ SOLN
INTRAMUSCULAR | Status: AC
  Filled 2015-03-11: qty 1

## 2015-03-11 MED ORDER — TRIAMCINOLONE ACETONIDE 0.1 % MT PSTE: PASTE | OROMUCOSAL | Status: AC

## 2015-03-11 MED ORDER — FAMOTIDINE IN NACL 20-0.9 MG/50ML-% IV SOLN
INTRAVENOUS | Status: AC
  Filled 2015-03-11: qty 50

## 2015-03-11 MED ORDER — SODIUM CHLORIDE 0.9 % IV SOLN
Freq: Once | INTRAVENOUS | Status: AC
  Administered 2015-03-11: 13:00:00 via INTRAVENOUS
  Filled 2015-03-11: qty 4

## 2015-03-11 MED ORDER — DIPHENHYDRAMINE HCL 50 MG/ML IJ SOLN
50.0000 mg | Freq: Once | INTRAMUSCULAR | Status: AC
Start: 2015-03-11 — End: 2015-03-11
  Administered 2015-03-11: 50 mg via INTRAVENOUS

## 2015-03-11 MED ORDER — PACLITAXEL CHEMO INJECTION 300 MG/50ML
80.0000 mg/m2 | Freq: Once | INTRAVENOUS | Status: AC
  Administered 2015-03-11: 150 mg via INTRAVENOUS
  Filled 2015-03-11: qty 25

## 2015-03-11 MED ORDER — FAMOTIDINE IN NACL 20-0.9 MG/50ML-% IV SOLN
20.0000 mg | Freq: Once | INTRAVENOUS | Status: AC
  Administered 2015-03-11: 20 mg via INTRAVENOUS

## 2015-03-11 MED ORDER — SODIUM CHLORIDE 0.9 % IV SOLN
Freq: Once | INTRAVENOUS | Status: AC
  Administered 2015-03-11: 12:00:00 via INTRAVENOUS

## 2015-03-11 NOTE — Patient Instructions (Signed)
[facility] Discharge Instructions for Patients Receiving Chemotherapy  Today you received the following chemotherapy agents TAXOL  To help prevent nausea and vomiting after your treatment, we encourage you to take your nausea medication as prescribed.   If you develop nausea and vomiting that is not controlled by your nausea medication, call the clinic.   BELOW ARE SYMPTOMS THAT SHOULD BE REPORTED IMMEDIATELY:  *FEVER GREATER THAN 100.5 F  *CHILLS WITH OR WITHOUT FEVER  NAUSEA AND VOMITING THAT IS NOT CONTROLLED WITH YOUR NAUSEA MEDICATION  *UNUSUAL SHORTNESS OF BREATH  *UNUSUAL BRUISING OR BLEEDING  TENDERNESS IN MOUTH AND THROAT WITH OR WITHOUT PRESENCE OF ULCERS  *URINARY PROBLEMS  *BOWEL PROBLEMS  UNUSUAL RASH Items with * indicate a potential emergency and should be followed up as soon as possible.  Feel free to call the clinic you have any questions or concerns. The clinic phone number is [phone].  Please show the CHEMO ALERT CARD at check-in to the Emergency Department and triage nurse.   

## 2015-03-11 NOTE — Progress Notes (Signed)
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 She will need granix days 2,3,9,16 with ongoing dose dense regimen.   Patient was seen by NP on 03-10-15 with temperature 100.3, not neutropenic and CXR ok. With known broken tooth and some sinus symptoms, she was started on doxycycline. She has not had further temperature elevation, did not take premed decadron last pm in order not to cover up infection symptoms. She notices some SOB with exertion. She started ferrous fumarate. Abdominal pain improved with good bowel movement on 03-04-15, and bowels have moved well this AM. She understands that she needs to continue regular laxatives. Urine culture 03-01-15 had no infection. She has ulcer on tip of tongue. Peripheral IV access has not been difficult.  No PAC  Sister works at [employer] with my previous patient R.Richards.  ONCOLOGIC HISTORY Patient had not had regular medical care since she lost job and medical insurance. She had progressive abdominal swelling over ~ 6 months as well as some postmenopausal bleeding when she was admitted to [facility] in 11-2014 with abdominal pain and presumed peritonitis, initially thought to be either gall bladder related or cirrhosis. Initial paracentesis 12-11-2014 was for 3.5 liters, with malignant cytology consistent with gyn malignancy. Notes refer to CT and MRI "which demonstrated omental thickening, extensive ascites, slight enlargement of the right adnexa and a thickened endometrium (1.1 cm)." She was referred to Dr Josephina Shih, seen on 12-25-14 with endometrial biopsy not possible due to cervical  stenosis. She had second paracentesis for 4.2 liters on 01-04-15. CA 125 on 01-07-15 was 1941 (pre op).She had TAH BSO, omentectomy and radical debulking by Dr Denman George on 01-12-15, with 4 liters of ascites at time of surgery. Findings at surgery were of milial tumor over entire small bowel serosa, mesentery, diaphragm, plaque on bladder and involving cul de sac peritoneum. Surgery was optimal R1 cytoreduction. Pathology [phone]) demonstrated high grade serous carcinoma, IIIC primary peritoneal and at least IB endometrial.. Recommendation was for 6 cycles of carboplatin and taxol, then likely whole pelvic RT + vaginal brachytherapy if she has CR. US paracentesis 01-29-15 for 1.5 liters. She began dose dense carboplatin taxol on 02-04-15.    Review of systems as above, also: No other symptoms of infection. No significant peripheral neuropathy or taxol aches. No bleeding. The broken tooth is not painful. No bleeding. No LE swelling. Remainder of 10 point Review of Systems negative.  Objective:  Vital signs in last 24 hours:  BP 152/78 mmHg  Pulse 100  Temp(Src) 98 F (36.7 C) (Oral)  Resp 18  Ht 5' 7"  (1.702 m)  Wt 170 lb (77.111 kg)  BMI 26.62 kg/m2  SpO2 98% Weight stable. Pale, not icteric, not acutely ill but looks generally tired Alert, oriented and appropriate, very pleasant as always and sister very supportive. Ambulatory without assistance.  Alopecia  HEENT:PERRL, sclerae not icteric. Oral mucosa moist, 3 mm aphthous ulceration tip of tongue, posterior pharynx minimal dull erythema consistent with post nasal drainage. No swelling or obvious dental infection.  Neck supple. No JVD.  Lymphatics:no cervical,supraclavicular or inguinal adenopathy Resp: clear to auscultation bilaterally and normal percussion bilaterally Cardio: regular rate and  rhythm. No gallop. GI: abdomen softer, not tender now upper mid abdomen or LLQ, less distended, no mass or organomegaly. Normally active bowel sounds.  Surgical incision closed, not remarkable. Musculoskeletal/ Extremities: without pitting edema, cords, tenderness Neuro: no significant peripheral neuropathy. Otherwise nonfocal. Psych appropriate mood and affect Skin without rash, ecchymosis, petechiae   Lab Results:  Results for orders placed or performed in visit on 03/10/15  CBC with Differential  Result Value Ref Range   WBC 7.6 3.9 - 10.3 10e3/uL   NEUT# 3.5 1.5 - 6.5 10e3/uL   HGB 9.2 (L) 11.6 - 15.9 g/dL   HCT 27.4 (L) 34.8 - 46.6 %   Platelets 214 145 - 400 10e3/uL   MCV 85.7 79.5 - 101.0 fL   MCH 28.8 25.1 - 34.0 pg   MCHC 33.6 31.5 - 36.0 g/dL   RBC 3.20 (L) 3.70 - 5.45 10e6/uL   RDW 17.3 (H) 11.2 - 14.5 %   lymph# 2.1 0.9 - 3.3 10e3/uL   MONO# 1.9 (H) 0.1 - 0.9 10e3/uL   Eosinophils Absolute 0.0 0.0 - 0.5 10e3/uL   Basophils Absolute 0.0 0.0 - 0.1 10e3/uL   NEUT% 45.9 38.4 - 76.8 %   LYMPH% 27.9 14.0 - 49.7 %   MONO% 25.4 (H) 0.0 - 14.0 %   EOS% 0.3 0.0 - 7.0 %   BASO% 0.5 0.0 - 2.0 %  Comprehensive metabolic panel (Cmet) - [facility]  Result Value Ref Range   Sodium 139 136 - 145 mEq/L   Potassium 3.6 3.5 - 5.1 mEq/L   Chloride 98 98 - 109 mEq/L   CO2 26 22 - 29 mEq/L   Glucose 112 70 - 140 mg/dl   BUN 7.4 7.0 - 26.0 mg/dL   Creatinine 0.6 0.6 - 1.1 mg/dL   Total Bilirubin 0.34 0.20 - 1.20 mg/dL   Alkaline Phosphatase 132 40 - 150 U/L   AST 17 5 - 34 U/L   ALT 20 0 - 55 U/L   Total Protein 6.6 6.4 - 8.3 g/dL   Albumin 2.8 (L) 3.5 - 5.0 g/dL   Calcium 8.6 8.4 - 10.4 mg/dL   Anion Gap 15 (H) 3 - 11 mEq/L   EGFR >90 >90 ml/min/1.73 m2  Iron and TIBC [facility]  Result Value Ref Range   Iron 28 (L) 41 - 142 ug/dL   TIBC 249 236 - 444 ug/dL   UIBC 221 120 - 384 ug/dL   %SAT 11 (L) 21 - 57 %    Last CA 125 on 02-25-15 was 2086, this having been 3784 on 02-08-15 and 1941 preop.  Studies/Results:  Dg Chest 2 View  03/10/2015   CLINICAL DATA:  64 year old female with fever and productive cough. Current history uterine  cancer undergoing chemotherapy. Initial encounter.  EXAM: CHEST  2 VIEW  COMPARISON:  01/07/2015.  FINDINGS: Stable lung volumes. Stable cardiac size and mediastinal contours. No pneumothorax, pulmonary edema, pleural effusion or consolidation. No confluent pulmonary opacity. No acute osseous abnormality identified. Negative visible bowel gas pattern.  Stable mild rightward deviation of the trachea at the thoracic inlet suggesting left thyroid goiter.  IMPRESSION: No acute cardiopulmonary abnormality.   Electronically Signed   By: Genevie Ann M.D.   On: 03/10/2015 14:51    Medications: I have reviewed the patient's current medications. Continue doxycycline. Continue daily laxative. Continue oral hemocyte; may need IV iron if not able to tolerate oral or if hemoglobin continues to drop. Triamcinolone acetamide 0.1% dental paste to aphthous ulcer  on tongue. Granix support days 2,3,9,16.  DISCUSSION: clinically stable for low dose taxol today even with slight temperature this week. Infusion RN aware to watch closely as she did not take usual premed decadron for this taxol.  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 Continue dose dense carbo taxol, day 8 cycle 2 today and granix on 4-22.  2.temp 100.3 earlier this week, covering with doxycycline but clinically stable to continue chemo now 3.constipation related to disease and surgery: daily laxatives to keep bowels moving daily. 4.po intake suboptimal: [place] dietician to follow up 03-25-15.  5.anemia from chemo, surgery: add ferrous fumarate. Iron low, may need IV. 6.long tobacco DCd fall 2015.  7.post partial thyroidectomy for goiter ~ 1980, on medication subsequently until ~ 1 year ago also related to lack of insurance then. Dr  Philip Aspen managing 8.broken lower tooth, uncomfortable but not acutely so: will reschedule with dentist, need to be careful with counts if invasive procedure. 9.GERD symptoms: on OTC pepcid now regularly, change to prescription if not adequate. 10. Oral ulcer as above 11. Has been given information on advanced directives   Chemo and granix orders done. All questions answered and they know to call if needed prior to next scheduled visit. Time spent 25 min including >50% counseling and coordination of care.    Rees Matura P, MD   03/11/2015, 11:58 AM

## 2015-03-11 NOTE — Telephone Encounter (Signed)
4/21 labs cancelled per 4/20 pof    anne

## 2015-03-11 NOTE — Telephone Encounter (Signed)
appointments made and avs will be printed in chemo

## 2015-03-12 ENCOUNTER — Ambulatory Visit (HOSPITAL_BASED_OUTPATIENT_CLINIC_OR_DEPARTMENT_OTHER): Payer: 59

## 2015-03-12 VITALS — BP 122/84 | HR 97 | Temp 98.3°F

## 2015-03-12 DIAGNOSIS — Z5189 Encounter for other specified aftercare: Secondary | ICD-10-CM | POA: Diagnosis not present

## 2015-03-12 DIAGNOSIS — C482 Malignant neoplasm of peritoneum, unspecified: Secondary | ICD-10-CM | POA: Diagnosis not present

## 2015-03-12 MED ORDER — TBO-FILGRASTIM 300 MCG/0.5ML ~~LOC~~ SOSY
300.0000 ug | PREFILLED_SYRINGE | Freq: Once | SUBCUTANEOUS | Status: AC
  Administered 2015-03-12: 300 ug via SUBCUTANEOUS
  Filled 2015-03-12: qty 0.5

## 2015-03-13 DIAGNOSIS — T451X5A Adverse effect of antineoplastic and immunosuppressive drugs, initial encounter: Secondary | ICD-10-CM

## 2015-03-13 DIAGNOSIS — D6481 Anemia due to antineoplastic chemotherapy: Secondary | ICD-10-CM | POA: Insufficient documentation

## 2015-03-13 DIAGNOSIS — R509 Fever, unspecified: Secondary | ICD-10-CM | POA: Insufficient documentation

## 2015-03-13 DIAGNOSIS — K121 Other forms of stomatitis: Secondary | ICD-10-CM | POA: Insufficient documentation

## 2015-03-13 DIAGNOSIS — D509 Iron deficiency anemia, unspecified: Secondary | ICD-10-CM | POA: Insufficient documentation

## 2015-03-18 ENCOUNTER — Other Ambulatory Visit (HOSPITAL_BASED_OUTPATIENT_CLINIC_OR_DEPARTMENT_OTHER): Payer: 59

## 2015-03-18 ENCOUNTER — Ambulatory Visit (HOSPITAL_BASED_OUTPATIENT_CLINIC_OR_DEPARTMENT_OTHER): Payer: 59

## 2015-03-18 VITALS — BP 152/72 | HR 112 | Temp 97.8°F | Resp 18

## 2015-03-18 DIAGNOSIS — C482 Malignant neoplasm of peritoneum, unspecified: Secondary | ICD-10-CM

## 2015-03-18 DIAGNOSIS — Z5111 Encounter for antineoplastic chemotherapy: Secondary | ICD-10-CM

## 2015-03-18 DIAGNOSIS — C55 Malignant neoplasm of uterus, part unspecified: Secondary | ICD-10-CM

## 2015-03-18 LAB — CBC WITH DIFFERENTIAL/PLATELET
BASO%: 0 % (ref 0.0–2.0)
BASOS ABS: 0 10*3/uL (ref 0.0–0.1)
EOS%: 0 % (ref 0.0–7.0)
Eosinophils Absolute: 0 10*3/uL (ref 0.0–0.5)
HCT: 25.2 % — ABNORMAL LOW (ref 34.8–46.6)
HEMOGLOBIN: 8.7 g/dL — AB (ref 11.6–15.9)
LYMPH%: 22.3 % (ref 14.0–49.7)
MCH: 29.5 pg (ref 25.1–34.0)
MCHC: 34.5 g/dL (ref 31.5–36.0)
MCV: 85.4 fL (ref 79.5–101.0)
MONO#: 0 10*3/uL — ABNORMAL LOW (ref 0.1–0.9)
MONO%: 1.7 % (ref 0.0–14.0)
NEUT#: 1.8 10*3/uL (ref 1.5–6.5)
NEUT%: 76 % (ref 38.4–76.8)
Platelets: 197 10*3/uL (ref 145–400)
RBC: 2.95 10*6/uL — AB (ref 3.70–5.45)
RDW: 17.3 % — AB (ref 11.2–14.5)
WBC: 2.4 10*3/uL — AB (ref 3.9–10.3)
lymph#: 0.5 10*3/uL — ABNORMAL LOW (ref 0.9–3.3)

## 2015-03-18 LAB — COMPREHENSIVE METABOLIC PANEL (CC13)
ALBUMIN: 2.9 g/dL — AB (ref 3.5–5.0)
ALT: 19 U/L (ref 0–55)
AST: 13 U/L (ref 5–34)
Alkaline Phosphatase: 129 U/L (ref 40–150)
Anion Gap: 16 mEq/L — ABNORMAL HIGH (ref 3–11)
BUN: 11.6 mg/dL (ref 7.0–26.0)
CALCIUM: 9.1 mg/dL (ref 8.4–10.4)
CO2: 20 meq/L — AB (ref 22–29)
Chloride: 98 mEq/L (ref 98–109)
Creatinine: 0.7 mg/dL (ref 0.6–1.1)
EGFR: 90 mL/min/{1.73_m2} — ABNORMAL LOW (ref >90)
GLUCOSE: 163 mg/dL — AB (ref 70–140)
Potassium: 3.8 mEq/L (ref 3.5–5.1)
SODIUM: 134 meq/L — AB (ref 136–145)
TOTAL PROTEIN: 7.1 g/dL (ref 6.4–8.3)
Total Bilirubin: 0.32 mg/dL (ref 0.20–1.20)

## 2015-03-18 MED ORDER — DIPHENHYDRAMINE HCL 50 MG/ML IJ SOLN
50.0000 mg | Freq: Once | INTRAMUSCULAR | Status: AC
  Administered 2015-03-18: 50 mg via INTRAVENOUS

## 2015-03-18 MED ORDER — DIPHENHYDRAMINE HCL 50 MG/ML IJ SOLN
INTRAMUSCULAR | Status: AC
  Filled 2015-03-18: qty 1

## 2015-03-18 MED ORDER — SODIUM CHLORIDE 0.9 % IV SOLN
Freq: Once | INTRAVENOUS | Status: AC
  Administered 2015-03-18: 11:00:00 via INTRAVENOUS

## 2015-03-18 MED ORDER — PACLITAXEL CHEMO INJECTION 300 MG/50ML
80.0000 mg/m2 | Freq: Once | INTRAVENOUS | Status: AC
  Administered 2015-03-18: 150 mg via INTRAVENOUS
  Filled 2015-03-18: qty 25

## 2015-03-18 MED ORDER — FAMOTIDINE IN NACL 20-0.9 MG/50ML-% IV SOLN
INTRAVENOUS | Status: AC
  Filled 2015-03-18: qty 50

## 2015-03-18 MED ORDER — SODIUM CHLORIDE 0.9 % IV SOLN
682.8000 mg | Freq: Once | INTRAVENOUS | Status: AC
  Administered 2015-03-18: 680 mg via INTRAVENOUS
  Filled 2015-03-18: qty 68

## 2015-03-18 MED ORDER — FAMOTIDINE IN NACL 20-0.9 MG/50ML-% IV SOLN
20.0000 mg | Freq: Once | INTRAVENOUS | Status: AC
  Administered 2015-03-18: 20 mg via INTRAVENOUS

## 2015-03-18 MED ORDER — SODIUM CHLORIDE 0.9 % IV SOLN
Freq: Once | INTRAVENOUS | Status: AC
  Administered 2015-03-18: 11:00:00 via INTRAVENOUS
  Filled 2015-03-18: qty 8

## 2015-03-18 NOTE — Patient Instructions (Signed)
[facility] Discharge Instructions for Patients Receiving Chemotherapy  Today you received the following chemotherapy agents Taxol and Carboplatin.  To help prevent nausea and vomiting after your treatment, we encourage you to take your nausea medication as prescribed.   If you develop nausea and vomiting that is not controlled by your nausea medication, call the clinic.   BELOW ARE SYMPTOMS THAT SHOULD BE REPORTED IMMEDIATELY:  *FEVER GREATER THAN 100.5 F  *CHILLS WITH OR WITHOUT FEVER  NAUSEA AND VOMITING THAT IS NOT CONTROLLED WITH YOUR NAUSEA MEDICATION  *UNUSUAL SHORTNESS OF BREATH  *UNUSUAL BRUISING OR BLEEDING  TENDERNESS IN MOUTH AND THROAT WITH OR WITHOUT PRESENCE OF ULCERS  *URINARY PROBLEMS  *BOWEL PROBLEMS  UNUSUAL RASH Items with * indicate a potential emergency and should be followed up as soon as possible.  Feel free to call the clinic you have any questions or concerns. The clinic phone number is [phone].  Please show the CHEMO ALERT CARD at check-in to the Emergency Department and triage nurse.   

## 2015-03-18 NOTE — Progress Notes (Signed)
Per Dr.Livesay, proceed with treatment today, MD aware or elevated pulse. Recheck pulse.

## 2015-03-19 ENCOUNTER — Ambulatory Visit (HOSPITAL_BASED_OUTPATIENT_CLINIC_OR_DEPARTMENT_OTHER): Payer: 59

## 2015-03-19 VITALS — BP 143/64 | HR 113 | Temp 98.5°F

## 2015-03-19 DIAGNOSIS — Z5189 Encounter for other specified aftercare: Secondary | ICD-10-CM

## 2015-03-19 DIAGNOSIS — C482 Malignant neoplasm of peritoneum, unspecified: Secondary | ICD-10-CM

## 2015-03-19 MED ORDER — TBO-FILGRASTIM 300 MCG/0.5ML ~~LOC~~ SOSY
300.0000 ug | PREFILLED_SYRINGE | Freq: Once | SUBCUTANEOUS | Status: AC
  Administered 2015-03-19: 300 ug via SUBCUTANEOUS
  Filled 2015-03-19: qty 0.5

## 2015-03-20 ENCOUNTER — Ambulatory Visit (HOSPITAL_BASED_OUTPATIENT_CLINIC_OR_DEPARTMENT_OTHER): Payer: 59

## 2015-03-20 VITALS — BP 151/64 | HR 96 | Temp 97.1°F

## 2015-03-20 DIAGNOSIS — Z5189 Encounter for other specified aftercare: Secondary | ICD-10-CM | POA: Diagnosis not present

## 2015-03-20 DIAGNOSIS — C482 Malignant neoplasm of peritoneum, unspecified: Secondary | ICD-10-CM

## 2015-03-20 MED ORDER — TBO-FILGRASTIM 300 MCG/0.5ML ~~LOC~~ SOSY
300.0000 ug | PREFILLED_SYRINGE | Freq: Once | SUBCUTANEOUS | Status: AC
  Administered 2015-03-20: 300 ug via SUBCUTANEOUS

## 2015-03-24 ENCOUNTER — Other Ambulatory Visit: Payer: Self-pay | Admitting: Oncology

## 2015-03-24 ENCOUNTER — Other Ambulatory Visit: Payer: Self-pay

## 2015-03-24 DIAGNOSIS — C482 Malignant neoplasm of peritoneum, unspecified: Secondary | ICD-10-CM

## 2015-03-24 DIAGNOSIS — C55 Malignant neoplasm of uterus, part unspecified: Secondary | ICD-10-CM

## 2015-03-24 MED ORDER — DEXAMETHASONE 4 MG PO TABS: ORAL_TABLET | ORAL | Status: DC

## 2015-03-24 NOTE — Telephone Encounter (Signed)
Told Jessica Payne that Dr. Marko Plume said that she can reduce her Decadron premed to 5 tablets 12 hours prior to chemotherapy only since she has not had a reaction to the Taxol.  Sent prescription to her pharmacy for 30 tabs with a refill.Jessica Payne verbalized understanding.

## 2015-03-25 ENCOUNTER — Encounter: Payer: Self-pay | Admitting: Oncology

## 2015-03-25 ENCOUNTER — Ambulatory Visit: Payer: 59 | Admitting: Nutrition

## 2015-03-25 ENCOUNTER — Ambulatory Visit (HOSPITAL_BASED_OUTPATIENT_CLINIC_OR_DEPARTMENT_OTHER): Payer: 59 | Admitting: Oncology

## 2015-03-25 ENCOUNTER — Ambulatory Visit (HOSPITAL_BASED_OUTPATIENT_CLINIC_OR_DEPARTMENT_OTHER): Payer: 59

## 2015-03-25 ENCOUNTER — Other Ambulatory Visit (HOSPITAL_BASED_OUTPATIENT_CLINIC_OR_DEPARTMENT_OTHER): Payer: 59

## 2015-03-25 VITALS — BP 160/79 | HR 116 | Temp 97.7°F | Resp 19 | Ht 67.0 in | Wt 167.1 lb

## 2015-03-25 DIAGNOSIS — C541 Malignant neoplasm of endometrium: Secondary | ICD-10-CM

## 2015-03-25 DIAGNOSIS — K0381 Cracked tooth: Secondary | ICD-10-CM

## 2015-03-25 DIAGNOSIS — D701 Agranulocytosis secondary to cancer chemotherapy: Secondary | ICD-10-CM

## 2015-03-25 DIAGNOSIS — C482 Malignant neoplasm of peritoneum, unspecified: Secondary | ICD-10-CM

## 2015-03-25 DIAGNOSIS — Z95828 Presence of other vascular implants and grafts: Secondary | ICD-10-CM

## 2015-03-25 DIAGNOSIS — C55 Malignant neoplasm of uterus, part unspecified: Secondary | ICD-10-CM

## 2015-03-25 DIAGNOSIS — Z5189 Encounter for other specified aftercare: Secondary | ICD-10-CM

## 2015-03-25 DIAGNOSIS — D509 Iron deficiency anemia, unspecified: Secondary | ICD-10-CM | POA: Diagnosis not present

## 2015-03-25 DIAGNOSIS — K5904 Chronic idiopathic constipation: Secondary | ICD-10-CM

## 2015-03-25 DIAGNOSIS — D6481 Anemia due to antineoplastic chemotherapy: Secondary | ICD-10-CM | POA: Diagnosis not present

## 2015-03-25 DIAGNOSIS — K2100 Gastro-esophageal reflux disease with esophagitis, without bleeding: Secondary | ICD-10-CM

## 2015-03-25 DIAGNOSIS — T451X5A Adverse effect of antineoplastic and immunosuppressive drugs, initial encounter: Secondary | ICD-10-CM

## 2015-03-25 DIAGNOSIS — K5909 Other constipation: Secondary | ICD-10-CM

## 2015-03-25 DIAGNOSIS — K219 Gastro-esophageal reflux disease without esophagitis: Secondary | ICD-10-CM

## 2015-03-25 LAB — COMPREHENSIVE METABOLIC PANEL (CC13)
ALT: 21 U/L (ref 0–55)
ANION GAP: 18 meq/L — AB (ref 3–11)
AST: 18 U/L (ref 5–34)
Albumin: 3.2 g/dL — ABNORMAL LOW (ref 3.5–5.0)
Alkaline Phosphatase: 124 U/L (ref 40–150)
BILIRUBIN TOTAL: 0.35 mg/dL (ref 0.20–1.20)
BUN: 14.3 mg/dL (ref 7.0–26.0)
CO2: 20 mEq/L — ABNORMAL LOW (ref 22–29)
CREATININE: 0.7 mg/dL (ref 0.6–1.1)
Calcium: 9.1 mg/dL (ref 8.4–10.4)
Chloride: 99 mEq/L (ref 98–109)
EGFR: >90 mL/min/{1.73_m2} (ref >90)
Glucose: 172 mg/dl — ABNORMAL HIGH (ref 70–140)
Potassium: 3.6 mEq/L (ref 3.5–5.1)
SODIUM: 137 meq/L (ref 136–145)
Total Protein: 7.1 g/dL (ref 6.4–8.3)

## 2015-03-25 LAB — CBC WITH DIFFERENTIAL/PLATELET
BASO%: 0 % (ref 0.0–2.0)
BASOS ABS: 0 10*3/uL (ref 0.0–0.1)
EOS%: 0 % (ref 0.0–7.0)
Eosinophils Absolute: 0 10*3/uL (ref 0.0–0.5)
HCT: 25.1 % — ABNORMAL LOW (ref 34.8–46.6)
HEMOGLOBIN: 8.6 g/dL — AB (ref 11.6–15.9)
LYMPH#: 0.5 10*3/uL — AB (ref 0.9–3.3)
LYMPH%: 29.8 % (ref 14.0–49.7)
MCH: 29.5 pg (ref 25.1–34.0)
MCHC: 34.3 g/dL (ref 31.5–36.0)
MCV: 86 fL (ref 79.5–101.0)
MONO#: 0.1 10*3/uL (ref 0.1–0.9)
MONO%: 6.7 % (ref 0.0–14.0)
NEUT%: 63.5 % (ref 38.4–76.8)
NEUTROS ABS: 1.1 10*3/uL — AB (ref 1.5–6.5)
Platelets: 243 10*3/uL (ref 145–400)
RBC: 2.92 10*6/uL — AB (ref 3.70–5.45)
RDW: 17.6 % — AB (ref 11.2–14.5)
WBC: 1.8 10*3/uL — AB (ref 3.9–10.3)

## 2015-03-25 LAB — CA 125: CA 125: 519 U/mL — AB (ref <35)

## 2015-03-25 MED ORDER — TBO-FILGRASTIM 300 MCG/0.5ML ~~LOC~~ SOSY
300.0000 ug | PREFILLED_SYRINGE | Freq: Once | SUBCUTANEOUS | Status: AC
  Administered 2015-03-25: 300 ug via SUBCUTANEOUS
  Filled 2015-03-25: qty 0.5

## 2015-03-25 MED ORDER — SODIUM CHLORIDE 0.9 % IV SOLN
510.0000 mg | Freq: Once | INTRAVENOUS | Status: AC
  Administered 2015-03-25: 510 mg via INTRAVENOUS
  Filled 2015-03-25: qty 17

## 2015-03-25 MED ORDER — SODIUM CHLORIDE 0.9 % IV SOLN
INTRAVENOUS | Status: DC
  Administered 2015-03-25: 11:00:00 via INTRAVENOUS

## 2015-03-25 MED ORDER — PANTOPRAZOLE SODIUM 40 MG PO TBEC: 40.0000 mg | DELAYED_RELEASE_TABLET | Freq: Every day | ORAL | Status: DC

## 2015-03-25 NOTE — Progress Notes (Signed)
Nutrition follow-up completed with patient diagnosed with peritoneal carcinomatosis.  Patient reports she feels well. She is trying to eat 4-5 times daily focusing on protein containing foods. Weight has decreased slightly and was documented as 167.1 pounds May 5 down from 171.6 pounds March 15. Patient declines oral nutrition supplements.  Nutrition diagnosis: Food and nutrition related knowledge deficit has improved.  Intervention: Encouraged patient to continue to consume high protein foods a minimum of 6 times daily.   Educated patient on the importance of adding in complex carbohydrates, vegetables, and fruits as tolerated. Patient educated to continue increased hydration. Questions were answered.  Teach back method used.  Monitoring, evaluation, goals: Patient will tolerate increased calories and adequate protein to maintain lean body mass with reduced nutrition impact symptoms.  Next visit: Thursday, May 19, during infusion.  **Disclaimer: This note was dictated with voice recognition software. Similar sounding words can inadvertently be transcribed and this note may contain transcription errors which may not have been corrected upon publication of note.**

## 2015-03-25 NOTE — Progress Notes (Signed)
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 She is due day 8 cycle 3 today, however ANC is just 1.1 despite granix on days 2 and 3 of cycle 3. We will hold chemo today but will give first dose feraheme.  Patient has felt better overall in past 1-2 weeks, mostly with improved abdominal fullness and discomfort. Bowels are moving better, now well daily. Sister notices that respirations are slightly labored when she walks, tho patient is not really aware of SOB; she has been tachycardic. Appetite is better. She has had no overt bleeding. She is reluctant to take oral iron due to constipation issues.    No PAC No genetics testing done     ONCOLOGIC HISTORY Patient had not had regular medical care since she lost job and medical insurance. She had progressive abdominal swelling over ~ 6 months as well as some postmenopausal bleeding when she was admitted to [facility] in 11-2014 with abdominal pain and presumed peritonitis, initially thought to be either gall bladder related or cirrhosis. Initial paracentesis 12-11-2014 was for 3.5 liters, with malignant cytology consistent with gyn malignancy. Notes refer to CT and MRI "which demonstrated omental thickening, extensive ascites, slight enlargement of the right adnexa and a thickened endometrium (1.1 cm)." She was referred to Dr Josephina Shih, seen on 12-25-14 with endometrial biopsy not possible due to cervical stenosis. She had second paracentesis for 4.2 liters on 01-04-15. CA 125 on 01-07-15 was 1941 (pre op).She had TAH BSO, omentectomy and radical debulking by Dr Denman George on 01-12-15, with 4 liters of ascites at time of surgery.  Findings at surgery were of milial tumor over entire small bowel serosa, mesentery, diaphragm, plaque on bladder and involving cul de sac peritoneum. Surgery was optimal R1 cytoreduction. Pathology [phone]) demonstrated high grade serous carcinoma, IIIC primary peritoneal and at least IB endometrial.. Recommendation was for 6 cycles of carboplatin and taxol, then likely whole pelvic RT + vaginal brachytherapy if she has CR. US paracentesis 01-29-15 for 1.5 liters. She began dose dense carboplatin taxol on 02-04-15.    Review of systems as above, also: No fever or symptoms of infection. GERD improved but not resolved on OTC, will change to protonix 40 mg daily. No LE swelling. Bladder ok. Seems more comfortable overall at home to sister (who is Therapist, sports). Minimal peripheral neuropathy tips of fingers and bottoms of feet, not bothersome. Remainder of 10 point Review of Systems negative.  Objective:  Vital signs in last 24 hours:  BP 160/79 mmHg  Pulse 116  Temp(Src) 97.7 F (36.5 C) (Oral)  Resp 19  Ht 5\' 7"  (1.702 m)  Wt 167 lb 1.6 oz (75.796 kg)  BMI 26.17 kg/m2  SpO2 100% Weight down 3 lbs Alert, oriented and appropriate. Ambulatory without assistance. Pale, not icteric.Respirations not labored with activity in exam room Alopecia  HEENT:PERRL, sclerae not icteric. Oral mucosa moist without lesions, posterior pharynx clear.  Neck supple. No JVD.  Lymphatics:no cervical,supraclavicular, axillary or inguinal adenopathy Resp: clear to auscultation bilaterally and normal percussion bilaterally Cardio: regular rate and rhythm. No gallop. GI: soft, nontender, less distended, no appreciable mass or organomegaly. Normally active bowel sounds. Surgical incision closed, not tender. Musculoskeletal/ Extremities: without pitting edema, cords,  tenderness Neuro: no significant peripheral neuropathy. Otherwise nonfocal. PSYCH appropriate mood and affect Skin without rash, ecchymosis, petechiae. No  problems at sites of previous IV access.   Lab Results:  Results for orders placed or performed in visit on 03/25/15  CBC with Differential  Result Value Ref Range   WBC 1.8 (L) 3.9 - 10.3 10e3/uL   NEUT# 1.1 (L) 1.5 - 6.5 10e3/uL   HGB 8.6 (L) 11.6 - 15.9 g/dL   HCT 25.1 (L) 34.8 - 46.6 %   Platelets 243 145 - 400 10e3/uL   MCV 86.0 79.5 - 101.0 fL   MCH 29.5 25.1 - 34.0 pg   MCHC 34.3 31.5 - 36.0 g/dL   RBC 2.92 (L) 3.70 - 5.45 10e6/uL   RDW 17.6 (H) 11.2 - 14.5 %   lymph# 0.5 (L) 0.9 - 3.3 10e3/uL   MONO# 0.1 0.1 - 0.9 10e3/uL   Eosinophils Absolute 0.0 0.0 - 0.5 10e3/uL   Basophils Absolute 0.0 0.0 - 0.1 10e3/uL   NEUT% 63.5 38.4 - 76.8 %   LYMPH% 29.8 14.0 - 49.7 %   MONO% 6.7 0.0 - 14.0 %   EOS% 0.0 0.0 - 7.0 %   BASO% 0.0 0.0 - 2.0 %   CMET available after visit with glucose on steroids 172, CO2 20, alb 3.2 (improved) CA 125 available after visit 519, this having been 2086 on 02-25-15 (surgery 01-12-15).  Iron 03-10-15 serum 28 and %sat 11  Studies/Results:  No results found.  Medications: I have reviewed the patient's current medications. Begin protonix. Will give feraheme in divided doses today and next week (OK per financial staff now), does not need po iron in addition. Hold day 8 cycle 3 taxol today.  DISCUSSION: meds as above. If hemoglobin continues to drop despite feraheme, which is possible due to the chemo, likely will need PRBCs.  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 Clinically improving with dose dense carbo taxol tho ANC not maintaining as well as needed. Cancel day 8 cycle 3 taxol today, granix today and 5-6, then x 2 days after each treatment. She will have day 15 cycle 3 on 04-01-15. 2.iron deficiency anemia + chemo  anemia: feraheme today and next week 3.constipation related to disease and surgery: daily laxatives helping 4.po intake suboptimal: [place] dietician to follow up 03-25-15.  5.GERD begin protonix 6.long tobacco DCd fall 2015.  7.post partial thyroidectomy for goiter ~ 1980, on medication subsequently until ~ 1 year ago also related to lack of insurance then. Dr Philip Aspen managing 8.broken lower tooth, uncomfortable but not acutely so: trying to schedule with dentist, need to be careful with counts if invasive procedure. 9.Has been given information on advanced directives   Chemo orders adjusted, feraheme added, granix done. All questions answered and patient/ sister know to call prior to next scheduled visit if needed. Time spent 25 min including >50% counseling and coordination of care.    Jenine Krisher P, MD   03/25/2015, 9:56 AM

## 2015-03-25 NOTE — Patient Instructions (Signed)

## 2015-03-26 ENCOUNTER — Other Ambulatory Visit: Payer: Self-pay | Admitting: Oncology

## 2015-03-26 ENCOUNTER — Ambulatory Visit (HOSPITAL_BASED_OUTPATIENT_CLINIC_OR_DEPARTMENT_OTHER): Payer: 59

## 2015-03-26 ENCOUNTER — Telehealth: Payer: Self-pay | Admitting: *Deleted

## 2015-03-26 VITALS — BP 157/79 | HR 126 | Temp 98.5°F

## 2015-03-26 DIAGNOSIS — Z5189 Encounter for other specified aftercare: Secondary | ICD-10-CM

## 2015-03-26 DIAGNOSIS — C482 Malignant neoplasm of peritoneum, unspecified: Secondary | ICD-10-CM | POA: Diagnosis not present

## 2015-03-26 DIAGNOSIS — C541 Malignant neoplasm of endometrium: Secondary | ICD-10-CM

## 2015-03-26 MED ORDER — TBO-FILGRASTIM 300 MCG/0.5ML ~~LOC~~ SOSY
300.0000 ug | PREFILLED_SYRINGE | Freq: Once | SUBCUTANEOUS | Status: AC
  Administered 2015-03-26: 300 ug via SUBCUTANEOUS
  Filled 2015-03-26: qty 0.5

## 2015-03-26 NOTE — Telephone Encounter (Signed)
Pt notified of results below 

## 2015-03-26 NOTE — Telephone Encounter (Signed)
-----   Message from Gordy Levan, MD sent at 03/26/2015  1:13 PM EDT ----- Labs seen and need follow up: please let her know marker down to [phone] - she is for granix at 2:00 today   thanks

## 2015-03-26 NOTE — Patient Instructions (Signed)

## 2015-03-27 ENCOUNTER — Ambulatory Visit: Payer: 59

## 2015-03-29 ENCOUNTER — Telehealth: Payer: Self-pay | Admitting: Oncology

## 2015-03-29 NOTE — Telephone Encounter (Signed)
Injections added per  pof and patient will get a new schedule at 5/12 appointment

## 2015-04-01 ENCOUNTER — Ambulatory Visit (HOSPITAL_BASED_OUTPATIENT_CLINIC_OR_DEPARTMENT_OTHER): Payer: 59

## 2015-04-01 ENCOUNTER — Ambulatory Visit: Payer: 59

## 2015-04-01 ENCOUNTER — Other Ambulatory Visit (HOSPITAL_BASED_OUTPATIENT_CLINIC_OR_DEPARTMENT_OTHER): Payer: 59

## 2015-04-01 VITALS — BP 162/75 | HR 71 | Temp 97.1°F | Resp 18

## 2015-04-01 DIAGNOSIS — C482 Malignant neoplasm of peritoneum, unspecified: Secondary | ICD-10-CM

## 2015-04-01 DIAGNOSIS — Z5189 Encounter for other specified aftercare: Secondary | ICD-10-CM | POA: Diagnosis not present

## 2015-04-01 DIAGNOSIS — C541 Malignant neoplasm of endometrium: Secondary | ICD-10-CM

## 2015-04-01 DIAGNOSIS — C55 Malignant neoplasm of uterus, part unspecified: Secondary | ICD-10-CM

## 2015-04-01 DIAGNOSIS — D509 Iron deficiency anemia, unspecified: Secondary | ICD-10-CM

## 2015-04-01 LAB — COMPREHENSIVE METABOLIC PANEL (CC13)
ALBUMIN: 2.9 g/dL — AB (ref 3.5–5.0)
ALT: 19 U/L (ref 0–55)
ANION GAP: 13 meq/L — AB (ref 3–11)
AST: 15 U/L (ref 5–34)
Alkaline Phosphatase: 129 U/L (ref 40–150)
BUN: 10.1 mg/dL (ref 7.0–26.0)
CALCIUM: 8.6 mg/dL (ref 8.4–10.4)
CHLORIDE: 101 meq/L (ref 98–109)
CO2: 23 mEq/L (ref 22–29)
CREATININE: 0.7 mg/dL (ref 0.6–1.1)
EGFR: >90 mL/min/{1.73_m2} (ref >90)
Glucose: 215 mg/dl — ABNORMAL HIGH (ref 70–140)
POTASSIUM: 3.4 meq/L — AB (ref 3.5–5.1)
Sodium: 137 mEq/L (ref 136–145)
Total Bilirubin: 0.3 mg/dL (ref 0.20–1.20)
Total Protein: 6.8 g/dL (ref 6.4–8.3)

## 2015-04-01 LAB — CBC WITH DIFFERENTIAL/PLATELET
BASO%: 0 % (ref 0.0–2.0)
BASOS ABS: 0 10*3/uL (ref 0.0–0.1)
EOS%: 0 % (ref 0.0–7.0)
Eosinophils Absolute: 0 10*3/uL (ref 0.0–0.5)
HEMATOCRIT: 25.5 % — AB (ref 34.8–46.6)
HGB: 8.6 g/dL — ABNORMAL LOW (ref 11.6–15.9)
LYMPH%: 26 % (ref 14.0–49.7)
MCH: 30.9 pg (ref 25.1–34.0)
MCHC: 33.7 g/dL (ref 31.5–36.0)
MCV: 91.7 fL (ref 79.5–101.0)
MONO#: 0.2 10*3/uL (ref 0.1–0.9)
MONO%: 9.5 % (ref 0.0–14.0)
NEUT#: 1.3 10*3/uL — ABNORMAL LOW (ref 1.5–6.5)
NEUT%: 64.5 % (ref 38.4–76.8)
PLATELETS: 128 10*3/uL — AB (ref 145–400)
RBC: 2.78 10*6/uL — ABNORMAL LOW (ref 3.70–5.45)
RDW: 21.9 % — ABNORMAL HIGH (ref 11.2–14.5)
WBC: 2 10*3/uL — AB (ref 3.9–10.3)
lymph#: 0.5 10*3/uL — ABNORMAL LOW (ref 0.9–3.3)

## 2015-04-01 MED ORDER — TBO-FILGRASTIM 300 MCG/0.5ML ~~LOC~~ SOSY
300.0000 ug | PREFILLED_SYRINGE | Freq: Once | SUBCUTANEOUS | Status: AC
  Administered 2015-04-01: 300 ug via SUBCUTANEOUS
  Filled 2015-04-01: qty 0.5

## 2015-04-01 MED ORDER — SODIUM CHLORIDE 0.9 % IV SOLN: Freq: Once | INTRAVENOUS | Status: DC

## 2015-04-01 MED ORDER — PACLITAXEL CHEMO INJECTION 300 MG/50ML: 80.0000 mg/m2 | Freq: Once | INTRAVENOUS | Status: DC

## 2015-04-01 MED ORDER — SODIUM CHLORIDE 0.9 % IV SOLN
Freq: Once | INTRAVENOUS | Status: AC
  Administered 2015-04-01: 12:00:00 via INTRAVENOUS

## 2015-04-01 MED ORDER — SODIUM CHLORIDE 0.9 % IV SOLN
510.0000 mg | Freq: Once | INTRAVENOUS | Status: AC
  Administered 2015-04-01: 510 mg via INTRAVENOUS
  Filled 2015-04-01: qty 17

## 2015-04-01 MED ORDER — DIPHENHYDRAMINE HCL 50 MG/ML IJ SOLN: 50.0000 mg | Freq: Once | INTRAMUSCULAR | Status: DC

## 2015-04-01 MED ORDER — FAMOTIDINE IN NACL 20-0.9 MG/50ML-% IV SOLN: 20.0000 mg | Freq: Once | INTRAVENOUS | Status: DC

## 2015-04-01 NOTE — Patient Instructions (Signed)
Ferumoxytol injection What is this medicine? FERUMOXYTOL is an iron complex. Iron is used to make healthy red blood cells, which carry oxygen and nutrients throughout the body. This medicine is used to treat iron deficiency anemia in people with chronic kidney disease. This medicine may be used for other purposes; ask your health care provider or pharmacist if you have questions. COMMON BRAND NAME(S): Feraheme What should I tell my health care provider before I take this medicine? They need to know if you have any of these conditions: -anemia not caused by low iron levels -high levels of iron in the blood -magnetic resonance imaging (MRI) test scheduled -an unusual or allergic reaction to iron, other medicines, foods, dyes, or preservatives -pregnant or trying to get pregnant -breast-feeding How should I use this medicine? This medicine is for injection into a vein. It is given by a health care professional in a hospital or clinic setting. Talk to your pediatrician regarding the use of this medicine in children. Special care may be needed. Overdosage: If you think you've taken too much of this medicine contact a poison control center or emergency room at once. Overdosage: If you think you have taken too much of this medicine contact a poison control center or emergency room at once. NOTE: This medicine is only for you. Do not share this medicine with others. What if I miss a dose? It is important not to miss your dose. Call your doctor or health care professional if you are unable to keep an appointment. What may interact with this medicine? This medicine may interact with the following medications: -other iron products This list may not describe all possible interactions. Give your health care provider a list of all the medicines, herbs, non-prescription drugs, or dietary supplements you use. Also tell them if you smoke, drink alcohol, or use illegal drugs. Some items may interact with your  medicine. What should I watch for while using this medicine? Visit your doctor or healthcare professional regularly. Tell your doctor or healthcare professional if your symptoms do not start to get better or if they get worse. You may need blood work done while you are taking this medicine. You may need to follow a special diet. Talk to your doctor. Foods that contain iron include: whole grains/cereals, dried fruits, beans, or peas, leafy green vegetables, and organ meats (liver, kidney). What side effects may I notice from receiving this medicine? Side effects that you should report to your doctor or health care professional as soon as possible: -allergic reactions like skin rash, itching or hives, swelling of the face, lips, or tongue -breathing problems -changes in blood pressure -feeling faint or lightheaded, falls -fever or chills -flushing, sweating, or hot feelings -swelling of the ankles or feet Side effects that usually do not require medical attention (Report these to your doctor or health care professional if they continue or are bothersome.): -diarrhea -headache -nausea, vomiting -stomach pain This list may not describe all possible side effects. Call your doctor for medical advice about side effects. You may report side effects to FDA at 1-800-FDA-1088. Where should I keep my medicine? This drug is given in a hospital or clinic and will not be stored at home. NOTE: This sheet is a summary. It may not cover all possible information. If you have questions about this medicine, talk to your doctor, pharmacist, or health care provider.  2015, Elsevier/Gold Standard. (2012-06-21 15:23:36)  Tbo-Filgrastim injection What is this medicine? TBO-FILGRASTIM (T B O fil GRA stim)  is a granulocyte colony-stimulating factor that stimulates the growth of neutrophils, a type of white blood cell important in the body's fight against infection. It is used to reduce the incidence of fever and  infection in patients with certain types of cancer who are receiving chemotherapy that affects the bone marrow. This medicine may be used for other purposes; ask your health care provider or pharmacist if you have questions. COMMON BRAND NAME(S): Granix What should I tell my health care provider before I take this medicine? They need to know if you have any of these conditions: -ongoing radiation therapy -sickle cell anemia -an unusual or allergic reaction to tbo-filgrastim, filgrastim, pegfilgrastim, other medicines, foods, dyes, or preservatives -pregnant or trying to get pregnant -breast-feeding How should I use this medicine? This medicine is for injection under the skin. If you get this medicine at home, you will be taught how to prepare and give this medicine. Refer to the Instructions for Use that come with your medication packaging. Use exactly as directed. Take your medicine at regular intervals. Do not take your medicine more often than directed. It is important that you put your used needles and syringes in a special sharps container. Do not put them in a trash can. If you do not have a sharps container, call your pharmacist or healthcare provider to get one. Talk to your pediatrician regarding the use of this medicine in children. Special care may be needed. Overdosage: If you think you've taken too much of this medicine contact a poison control center or emergency room at once. Overdosage: If you think you have taken too much of this medicine contact a poison control center or emergency room at once. NOTE: This medicine is only for you. Do not share this medicine with others. What if I miss a dose? It is important not to miss your dose. Call your doctor or health care professional if you miss a dose. What may interact with this medicine? This medicine may interact with the following medications: -medicines that may cause a release of neutrophils, such as lithium This list may not  describe all possible interactions. Give your health care provider a list of all the medicines, herbs, non-prescription drugs, or dietary supplements you use. Also tell them if you smoke, drink alcohol, or use illegal drugs. Some items may interact with your medicine. What should I watch for while using this medicine? You may need blood work done while you are taking this medicine. What side effects may I notice from receiving this medicine? Side effects that you should report to your doctor or health care professional as soon as possible: -allergic reactions like skin rash, itching or hives, swelling of the face, lips, or tongue -shortness of breath or breathing problems -fever -pain, redness, or irritation at site where injected -pinpoint red spots on the skin -stomach or side pain, or pain at the shoulder -swelling -tiredness -trouble passing urine Side effects that usually do not require medical attention (Report these to your doctor or health care professional if they continue or are bothersome.): -bone pain -muscle pain This list may not describe all possible side effects. Call your doctor for medical advice about side effects. You may report side effects to FDA at 1-800-FDA-1088. Where should I keep my medicine? Keep out of the reach of children. Store in a refrigerator between 2 and 8 degrees C (36 and 46 degrees F). Keep in carton to protect from light. Throw away this medicine if it is left out  of the refrigerator for more than 5 consecutive days. Throw away any unused medicine after the expiration date. NOTE: This sheet is a summary. It may not cover all possible information. If you have questions about this medicine, talk to your doctor, pharmacist, or health care provider.  2015, Elsevier/Gold Standard. (2014-02-26 11:52:29)

## 2015-04-01 NOTE — Progress Notes (Signed)
Per Dr. Marko Plume, no chemo today for patient.  Patient to get granix today and tomorrow.  Pharmacy notified.  Pt to receive 2nd half of feraheme dose today.

## 2015-04-02 ENCOUNTER — Ambulatory Visit (HOSPITAL_BASED_OUTPATIENT_CLINIC_OR_DEPARTMENT_OTHER): Payer: 59

## 2015-04-02 VITALS — BP 133/72 | HR 98 | Temp 98.4°F

## 2015-04-02 DIAGNOSIS — Z5189 Encounter for other specified aftercare: Secondary | ICD-10-CM

## 2015-04-02 DIAGNOSIS — C482 Malignant neoplasm of peritoneum, unspecified: Secondary | ICD-10-CM | POA: Diagnosis not present

## 2015-04-02 DIAGNOSIS — C541 Malignant neoplasm of endometrium: Secondary | ICD-10-CM | POA: Diagnosis not present

## 2015-04-02 MED ORDER — TBO-FILGRASTIM 300 MCG/0.5ML ~~LOC~~ SOSY
300.0000 ug | PREFILLED_SYRINGE | Freq: Once | SUBCUTANEOUS | Status: AC
  Administered 2015-04-02: 300 ug via SUBCUTANEOUS
  Filled 2015-04-02: qty 0.5

## 2015-04-03 ENCOUNTER — Ambulatory Visit: Payer: 59

## 2015-04-06 ENCOUNTER — Other Ambulatory Visit: Payer: Self-pay | Admitting: Oncology

## 2015-04-08 ENCOUNTER — Telehealth: Payer: Self-pay | Admitting: Oncology

## 2015-04-08 ENCOUNTER — Ambulatory Visit (HOSPITAL_BASED_OUTPATIENT_CLINIC_OR_DEPARTMENT_OTHER): Payer: 59

## 2015-04-08 ENCOUNTER — Ambulatory Visit (HOSPITAL_BASED_OUTPATIENT_CLINIC_OR_DEPARTMENT_OTHER): Payer: 59 | Admitting: Oncology

## 2015-04-08 ENCOUNTER — Ambulatory Visit: Payer: 59 | Admitting: Nutrition

## 2015-04-08 ENCOUNTER — Other Ambulatory Visit (HOSPITAL_BASED_OUTPATIENT_CLINIC_OR_DEPARTMENT_OTHER): Payer: 59

## 2015-04-08 ENCOUNTER — Encounter: Payer: Self-pay | Admitting: Oncology

## 2015-04-08 ENCOUNTER — Telehealth: Payer: Self-pay | Admitting: *Deleted

## 2015-04-08 VITALS — BP 143/78 | HR 109 | Temp 98.3°F | Resp 18 | Ht 67.0 in | Wt 166.4 lb

## 2015-04-08 DIAGNOSIS — D6481 Anemia due to antineoplastic chemotherapy: Secondary | ICD-10-CM | POA: Diagnosis not present

## 2015-04-08 DIAGNOSIS — C482 Malignant neoplasm of peritoneum, unspecified: Secondary | ICD-10-CM | POA: Diagnosis not present

## 2015-04-08 DIAGNOSIS — D701 Agranulocytosis secondary to cancer chemotherapy: Secondary | ICD-10-CM

## 2015-04-08 DIAGNOSIS — C541 Malignant neoplasm of endometrium: Secondary | ICD-10-CM

## 2015-04-08 DIAGNOSIS — K219 Gastro-esophageal reflux disease without esophagitis: Secondary | ICD-10-CM

## 2015-04-08 DIAGNOSIS — D509 Iron deficiency anemia, unspecified: Secondary | ICD-10-CM | POA: Diagnosis not present

## 2015-04-08 DIAGNOSIS — K0381 Cracked tooth: Secondary | ICD-10-CM

## 2015-04-08 DIAGNOSIS — K5909 Other constipation: Secondary | ICD-10-CM

## 2015-04-08 DIAGNOSIS — Z5111 Encounter for antineoplastic chemotherapy: Secondary | ICD-10-CM

## 2015-04-08 DIAGNOSIS — T451X5A Adverse effect of antineoplastic and immunosuppressive drugs, initial encounter: Secondary | ICD-10-CM

## 2015-04-08 LAB — COMPREHENSIVE METABOLIC PANEL (CC13)
ALK PHOS: 136 U/L (ref 40–150)
ALT: 16 U/L (ref 0–55)
ANION GAP: 14 meq/L — AB (ref 3–11)
AST: 13 U/L (ref 5–34)
Albumin: 3 g/dL — ABNORMAL LOW (ref 3.5–5.0)
BILIRUBIN TOTAL: 0.31 mg/dL (ref 0.20–1.20)
BUN: 14.1 mg/dL (ref 7.0–26.0)
CO2: 21 mEq/L — ABNORMAL LOW (ref 22–29)
Calcium: 8.6 mg/dL (ref 8.4–10.4)
Chloride: 101 mEq/L (ref 98–109)
Creatinine: 0.7 mg/dL (ref 0.6–1.1)
EGFR: 88 mL/min/{1.73_m2} — AB (ref >90)
Glucose: 193 mg/dl — ABNORMAL HIGH (ref 70–140)
Potassium: 3.7 mEq/L (ref 3.5–5.1)
SODIUM: 136 meq/L (ref 136–145)
Total Protein: 6.9 g/dL (ref 6.4–8.3)

## 2015-04-08 LAB — CBC WITH DIFFERENTIAL/PLATELET
BASO%: 0.3 % (ref 0.0–2.0)
Basophils Absolute: 0 10*3/uL (ref 0.0–0.1)
EOS ABS: 0 10*3/uL (ref 0.0–0.5)
EOS%: 0 % (ref 0.0–7.0)
HEMATOCRIT: 27.8 % — AB (ref 34.8–46.6)
HEMOGLOBIN: 9.5 g/dL — AB (ref 11.6–15.9)
LYMPH%: 20.9 % (ref 14.0–49.7)
MCH: 32.3 pg (ref 25.1–34.0)
MCHC: 34.2 g/dL (ref 31.5–36.0)
MCV: 94.6 fL (ref 79.5–101.0)
MONO#: 0.1 10*3/uL (ref 0.1–0.9)
MONO%: 4.5 % (ref 0.0–14.0)
NEUT#: 1.7 10*3/uL (ref 1.5–6.5)
NEUT%: 74.3 % (ref 38.4–76.8)
PLATELETS: 149 10*3/uL (ref 145–400)
RBC: 2.94 10*6/uL — ABNORMAL LOW (ref 3.70–5.45)
RDW: 25.7 % — ABNORMAL HIGH (ref 11.2–14.5)
WBC: 2.3 10*3/uL — ABNORMAL LOW (ref 3.9–10.3)
lymph#: 0.5 10*3/uL — ABNORMAL LOW (ref 0.9–3.3)

## 2015-04-08 MED ORDER — FAMOTIDINE IN NACL 20-0.9 MG/50ML-% IV SOLN
INTRAVENOUS | Status: AC
  Filled 2015-04-08: qty 50

## 2015-04-08 MED ORDER — PACLITAXEL CHEMO INJECTION 300 MG/50ML
80.0000 mg/m2 | Freq: Once | INTRAVENOUS | Status: AC
  Administered 2015-04-08: 150 mg via INTRAVENOUS
  Filled 2015-04-08: qty 25

## 2015-04-08 MED ORDER — SODIUM CHLORIDE 0.9 % IV SOLN
Freq: Once | INTRAVENOUS | Status: AC
  Administered 2015-04-08: 14:00:00 via INTRAVENOUS
  Filled 2015-04-08: qty 8

## 2015-04-08 MED ORDER — DIPHENHYDRAMINE HCL 50 MG/ML IJ SOLN
INTRAMUSCULAR | Status: AC
  Filled 2015-04-08: qty 1

## 2015-04-08 MED ORDER — DIPHENHYDRAMINE HCL 50 MG/ML IJ SOLN
50.0000 mg | Freq: Once | INTRAMUSCULAR | Status: AC
  Administered 2015-04-08: 50 mg via INTRAVENOUS

## 2015-04-08 MED ORDER — SODIUM CHLORIDE 0.9 % IV SOLN
Freq: Once | INTRAVENOUS | Status: AC
  Administered 2015-04-08: 13:00:00 via INTRAVENOUS

## 2015-04-08 MED ORDER — SODIUM CHLORIDE 0.9 % IV SOLN
455.2000 mg | Freq: Once | INTRAVENOUS | Status: AC
  Administered 2015-04-08: 460 mg via INTRAVENOUS
  Filled 2015-04-08: qty 46

## 2015-04-08 MED ORDER — FAMOTIDINE IN NACL 20-0.9 MG/50ML-% IV SOLN
20.0000 mg | Freq: Once | INTRAVENOUS | Status: AC
  Administered 2015-04-08: 20 mg via INTRAVENOUS

## 2015-04-08 NOTE — Patient Instructions (Signed)
[facility] Discharge Instructions for Patients Receiving Chemotherapy  Today you received the following chemotherapy agents: Taxol and Carboplatin.  To help prevent nausea and vomiting after your treatment, we encourage you to take your nausea medication: Zofran. Take one every eight hours as needed for nausea. Begin the morning of 5/20. You may also take Ativan as needed for nausea. This will make you drowsy.   If you develop nausea and vomiting that is not controlled by your nausea medication, call the clinic.   BELOW ARE SYMPTOMS THAT SHOULD BE REPORTED IMMEDIATELY:  *FEVER GREATER THAN 100.5 F  *CHILLS WITH OR WITHOUT FEVER  NAUSEA AND VOMITING THAT IS NOT CONTROLLED WITH YOUR NAUSEA MEDICATION  *UNUSUAL SHORTNESS OF BREATH  *UNUSUAL BRUISING OR BLEEDING  TENDERNESS IN MOUTH AND THROAT WITH OR WITHOUT PRESENCE OF ULCERS  *URINARY PROBLEMS  *BOWEL PROBLEMS  UNUSUAL RASH Items with * indicate a potential emergency and should be followed up as soon as possible.  Feel free to call the clinic should you have any questions or concerns. The clinic phone number is [phone].  Please show the [place] at check-in to the Emergency Department and triage nurse.

## 2015-04-08 NOTE — Progress Notes (Signed)
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 She is due day 1 cycle 4 dose dense carbo taxol today, after day 8 and day 15 cycle 3 were held due to low counts despite total 6 doses of granix over those 2 weeks. Carboplatin dose has been decreased for treatment today, and she will have granix 5-20, 21 and 23.  She had feraheme 5-5 and 04-01-15.  Patient has felt significantly better overall for past several days, with better energy and improved appetite. She denies pain. Abdominal distension is improved, tho still more full than her baseline, and bowels are moving regularly. She has slight peripheral neuropathy fingers and toes, not interfering with function. She has had no fever and no recent N/V.     No PAC No genetics testing done  Pre-operative CA 125  1941 on 01-07-15   ONCOLOGIC HISTORY Patient had not had regular medical care since she lost job and medical insurance. She had progressive abdominal swelling over ~ 6 months as well as some postmenopausal bleeding when she was admitted to [facility] in 11-2014 with abdominal pain and presumed peritonitis, initially thought to be either gall bladder related or cirrhosis. Initial paracentesis 12-11-2014 was for 3.5 liters, with malignant cytology consistent with gyn malignancy. Notes refer to CT and MRI "which demonstrated omental thickening, extensive ascites, slight enlargement of the right adnexa and a thickened endometrium (1.1 cm)." She was referred to Dr Josephina Shih, seen on 12-25-14 with endometrial biopsy not possible due to cervical stenosis. She had second paracentesis for 4.2 liters on 01-04-15. CA 125 on 01-07-15 was 1941 (pre op).She had TAH  BSO, omentectomy and radical debulking by Dr Denman George on 01-12-15, with 4 liters of ascites at time of surgery. Findings at surgery were of milial tumor over entire small bowel serosa, mesentery, diaphragm, plaque on bladder and involving cul de sac peritoneum. Surgery was optimal R1 cytoreduction. Pathology [id number]) demonstrated high grade serous carcinoma, IIIC primary peritoneal and at least IB endometrial.. Recommendation was for 6 cycles of carboplatin and taxol, then likely whole pelvic RT + vaginal brachytherapy if she has CR. US paracentesis 01-29-15 for 1.5 liters. She began dose dense carboplatin taxol on 02-04-15.     Review of systems as above, also: Seems less SOB with regular activities, no difficulty walking in office today. No bleeding. No LE swelling.  Remainder of 10 point Review of Systems negative.  Objective:  Vital signs in last 24 hours:  BP 143/78 mmHg  Pulse 109  Temp(Src) 98.3 F (36.8 C) (Oral)  Resp 18  Ht 5' 7"  (1.702 m)  Wt 166 lb 6.4 oz (75.479 kg)  BMI 26.06 kg/m2  SpO2 99% Weight down 1 lb. Not as pale. Respirations not labored with exertion in exam room; Alert, oriented and appropriate. More easily and quickly ambulatory.  Alopecia  HEENT:PERRL, sclerae not icteric. Oral mucosa moist without lesions, posterior pharynx clear.  Neck supple. No JVD.  Lymphatics:no cervical,supraclavicular,  or inguinal adenopathy Resp: clear to auscultation bilaterally and normal percussion bilaterally Cardio: regular rate and rhythm. No gallop. GI: soft, nontender, mildly distended still with probably fluid wave, no clear mass or organomegaly. Some bowel sounds. Surgical incision not remarkable. Musculoskeletal/ Extremities: without pitting edema, cords, tenderness Neuro: no  peripheral neuropathy. Otherwise nonfocal Skin without rash, ecchymosis, petechiae   Lab Results:  Results for orders placed or performed in visit on 04/08/15  CBC with Differential  Result  Value Ref Range   WBC 2.3 (L) 3.9 - 10.3 10e3/uL   NEUT# 1.7 1.5 - 6.5 10e3/uL   HGB 9.5 (L) 11.6 - 15.9 g/dL   HCT 27.8 (L) 34.8 - 46.6 %   Platelets 149 145 - 400 10e3/uL   MCV 94.6 79.5 - 101.0 fL   MCH 32.3 25.1 - 34.0 pg   MCHC 34.2 31.5 - 36.0 g/dL   RBC 2.94 (L) 3.70 - 5.45 10e6/uL   RDW 25.7 (H) 11.2 - 14.5 %   lymph# 0.5 (L) 0.9 - 3.3 10e3/uL   MONO# 0.1 0.1 - 0.9 10e3/uL   Eosinophils Absolute 0.0 0.0 - 0.5 10e3/uL   Basophils Absolute 0.0 0.0 - 0.1 10e3/uL   NEUT% 74.3 38.4 - 76.8 %   LYMPH% 20.9 14.0 - 49.7 %   MONO% 4.5 0.0 - 14.0 %   EOS% 0.0 0.0 - 7.0 %   BASO% 0.3 0.0 - 2.0 %  Comprehensive metabolic panel (Cmet) - [facility]  Result Value Ref Range   Sodium 136 136 - 145 mEq/L   Potassium 3.7 3.5 - 5.1 mEq/L   Chloride 101 98 - 109 mEq/L   CO2 21 (L) 22 - 29 mEq/L   Glucose 193 (H) 70 - 140 mg/dl   BUN 14.1 7.0 - 26.0 mg/dL   Creatinine 0.7 0.6 - 1.1 mg/dL   Total Bilirubin 0.31 0.20 - 1.20 mg/dL   Alkaline Phosphatase 136 40 - 150 U/L   AST 13 5 - 34 U/L   ALT 16 0 - 55 U/L   Total Protein 6.9 6.4 - 8.3 g/dL   Albumin 3.0 (L) 3.5 - 5.0 g/dL   Calcium 8.6 8.4 - 10.4 mg/dL   Anion Gap 14 (H) 3 - 11 mEq/L   EGFR 88 (L) >90 ml/min/1.73 m2    CA 125 on 03-25-15 was 519, this having been 2086 on 02-25-15 and 5145 on 01-28-14  Studies/Results:  No results found.  Medications: I have reviewed the patient's current medications. Granix 300 ordered for days 2,3,5  DISCUSSION: adjustments in [place] and granix doses discussed, hopefully will allow her to continue on schedule. 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 Clinically improving with chemotherapy possible thus far. Begin  cycle 4 today, carbo dose decreased and additional granix as noted. She will have day 8 cycle 4 on 5-26 as long as ANC >=1.5 and plt>=100k, with granix 5-27 and 5-28, then day 15 on 6-2 with same parameters and granix 6-3 and 6-4. I will see her with day 1 cycle.  2.iron deficiency anemia + chemo anemia: hemoglobin up a gram since IV iron 3.constipation related to disease and surgery: daily laxatives helping 4.po intake improving, dietician to follow up today 5.GERD begin protonix 6.long tobacco DCd fall 2015.  7.post partial thyroidectomy for goiter ~ 1980, on medication subsequently until ~ 1 year ago also related to lack of insurance then. Dr Philip Aspen managing 8.broken lower tooth, less uncomfortable: trying to schedule with dentist, need to be careful with counts if invasive procedure. 9.Has been given information on  advanced directives   Chemo and granix orders confirmed. Patient understands and is in agreement with plans as noted. Time spent 25 min including >50% counseling and coordination of care.   LIVESAY,LENNIS P, MD   04/08/2015, 5:21 PM

## 2015-04-08 NOTE — Telephone Encounter (Signed)
Gave avs & calendar for May thru June. Sent message to schedule treatment.

## 2015-04-08 NOTE — Progress Notes (Signed)
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 Patient reports she feels well. She is trying to eat often throughout the day focusing on protein foods. Weight decreased slightly and documented as 166.4 pounds May 19, down from 167.1 pounds May 5. Patient continues to decline oral nutrition1.  Nutrition diagnosis: Food and nutrition related knowledge deficit improved.  Intervention: Patient encouraged to continue high-protein, high-calorie foods9 encouraged patient to continue increased hydration. Questions answered.  Teach back method used.  Monitoring, evaluation, goals: Patient will continue to try to increase calories and protein to minimize weight loss.  Next visit: Patient will contact me with questions or concerns.  **Disclaimer: This note was dictated with voice recognition software. Similar sounding words can inadvertently be transcribed and this note may contain transcription errors which may not have been corrected upon publication of note.**

## 2015-04-08 NOTE — Telephone Encounter (Signed)
Per staff message and POF I have scheduled appts. Advised scheduler of appts. JMW  

## 2015-04-09 ENCOUNTER — Ambulatory Visit (HOSPITAL_BASED_OUTPATIENT_CLINIC_OR_DEPARTMENT_OTHER): Payer: 59

## 2015-04-09 VITALS — BP 123/60 | HR 98 | Temp 97.9°F

## 2015-04-09 DIAGNOSIS — C482 Malignant neoplasm of peritoneum, unspecified: Secondary | ICD-10-CM | POA: Diagnosis not present

## 2015-04-09 DIAGNOSIS — Z5189 Encounter for other specified aftercare: Secondary | ICD-10-CM

## 2015-04-09 DIAGNOSIS — C541 Malignant neoplasm of endometrium: Secondary | ICD-10-CM

## 2015-04-09 MED ORDER — TBO-FILGRASTIM 300 MCG/0.5ML ~~LOC~~ SOSY
300.0000 ug | PREFILLED_SYRINGE | Freq: Once | SUBCUTANEOUS | Status: AC
  Administered 2015-04-09: 300 ug via SUBCUTANEOUS
  Filled 2015-04-09: qty 0.5

## 2015-04-10 ENCOUNTER — Other Ambulatory Visit: Payer: Self-pay | Admitting: Oncology

## 2015-04-10 ENCOUNTER — Ambulatory Visit (HOSPITAL_BASED_OUTPATIENT_CLINIC_OR_DEPARTMENT_OTHER): Payer: 59

## 2015-04-10 VITALS — BP 110/75 | HR 78 | Temp 97.5°F | Resp 16

## 2015-04-10 DIAGNOSIS — C541 Malignant neoplasm of endometrium: Secondary | ICD-10-CM

## 2015-04-10 DIAGNOSIS — Z5189 Encounter for other specified aftercare: Secondary | ICD-10-CM | POA: Diagnosis not present

## 2015-04-10 DIAGNOSIS — C482 Malignant neoplasm of peritoneum, unspecified: Secondary | ICD-10-CM

## 2015-04-10 MED ORDER — TBO-FILGRASTIM 300 MCG/0.5ML ~~LOC~~ SOSY
300.0000 ug | PREFILLED_SYRINGE | Freq: Once | SUBCUTANEOUS | Status: AC
  Administered 2015-04-10: 300 ug via SUBCUTANEOUS

## 2015-04-12 ENCOUNTER — Ambulatory Visit (HOSPITAL_BASED_OUTPATIENT_CLINIC_OR_DEPARTMENT_OTHER): Payer: 59

## 2015-04-12 VITALS — BP 124/63 | HR 100 | Temp 98.4°F | Resp 16

## 2015-04-12 DIAGNOSIS — C482 Malignant neoplasm of peritoneum, unspecified: Secondary | ICD-10-CM

## 2015-04-12 DIAGNOSIS — C541 Malignant neoplasm of endometrium: Secondary | ICD-10-CM

## 2015-04-12 DIAGNOSIS — Z5189 Encounter for other specified aftercare: Secondary | ICD-10-CM

## 2015-04-12 MED ORDER — TBO-FILGRASTIM 300 MCG/0.5ML ~~LOC~~ SOSY
300.0000 ug | PREFILLED_SYRINGE | Freq: Once | SUBCUTANEOUS | Status: AC
  Administered 2015-04-12: 300 ug via SUBCUTANEOUS
  Filled 2015-04-12: qty 0.5

## 2015-04-12 NOTE — Patient Instructions (Signed)

## 2015-04-15 ENCOUNTER — Other Ambulatory Visit (HOSPITAL_BASED_OUTPATIENT_CLINIC_OR_DEPARTMENT_OTHER): Payer: 59

## 2015-04-15 ENCOUNTER — Other Ambulatory Visit: Payer: Self-pay | Admitting: Oncology

## 2015-04-15 ENCOUNTER — Ambulatory Visit (HOSPITAL_BASED_OUTPATIENT_CLINIC_OR_DEPARTMENT_OTHER): Payer: 59

## 2015-04-15 VITALS — BP 160/69 | HR 96 | Temp 98.2°F | Resp 18

## 2015-04-15 DIAGNOSIS — Z5111 Encounter for antineoplastic chemotherapy: Secondary | ICD-10-CM

## 2015-04-15 DIAGNOSIS — C541 Malignant neoplasm of endometrium: Secondary | ICD-10-CM

## 2015-04-15 DIAGNOSIS — C482 Malignant neoplasm of peritoneum, unspecified: Secondary | ICD-10-CM

## 2015-04-15 DIAGNOSIS — C55 Malignant neoplasm of uterus, part unspecified: Secondary | ICD-10-CM

## 2015-04-15 LAB — COMPREHENSIVE METABOLIC PANEL (CC13)
ALBUMIN: 3.3 g/dL — AB (ref 3.5–5.0)
ALK PHOS: 134 U/L (ref 40–150)
ALT: 18 U/L (ref 0–55)
AST: 16 U/L (ref 5–34)
Anion Gap: 12 mEq/L — ABNORMAL HIGH (ref 3–11)
BUN: 12.6 mg/dL (ref 7.0–26.0)
CALCIUM: 8.6 mg/dL (ref 8.4–10.4)
CO2: 22 meq/L (ref 22–29)
Chloride: 102 mEq/L (ref 98–109)
Creatinine: 0.7 mg/dL (ref 0.6–1.1)
GLUCOSE: 188 mg/dL — AB (ref 70–140)
Potassium: 3.6 mEq/L (ref 3.5–5.1)
Sodium: 137 mEq/L (ref 136–145)
TOTAL PROTEIN: 6.8 g/dL (ref 6.4–8.3)
Total Bilirubin: 0.35 mg/dL (ref 0.20–1.20)

## 2015-04-15 LAB — CBC WITH DIFFERENTIAL/PLATELET
BASO%: 0.2 % (ref 0.0–2.0)
Basophils Absolute: 0 10*3/uL (ref 0.0–0.1)
EOS ABS: 0 10*3/uL (ref 0.0–0.5)
EOS%: 0 % (ref 0.0–7.0)
HEMATOCRIT: 26.1 % — AB (ref 34.8–46.6)
HGB: 9.1 g/dL — ABNORMAL LOW (ref 11.6–15.9)
LYMPH#: 0.5 10*3/uL — AB (ref 0.9–3.3)
LYMPH%: 27.8 % (ref 14.0–49.7)
MCH: 32.9 pg (ref 25.1–34.0)
MCHC: 34.8 g/dL (ref 31.5–36.0)
MCV: 94.5 fL (ref 79.5–101.0)
MONO#: 0.1 10*3/uL (ref 0.1–0.9)
MONO%: 4.9 % (ref 0.0–14.0)
NEUT#: 1.3 10*3/uL — ABNORMAL LOW (ref 1.5–6.5)
NEUT%: 67.1 % (ref 38.4–76.8)
PLATELETS: 179 10*3/uL (ref 145–400)
RBC: 2.76 10*6/uL — ABNORMAL LOW (ref 3.70–5.45)
RDW: 23.8 % — ABNORMAL HIGH (ref 11.2–14.5)
WBC: 1.9 10*3/uL — ABNORMAL LOW (ref 3.9–10.3)

## 2015-04-15 MED ORDER — FAMOTIDINE IN NACL 20-0.9 MG/50ML-% IV SOLN
20.0000 mg | Freq: Once | INTRAVENOUS | Status: AC
  Administered 2015-04-15: 20 mg via INTRAVENOUS

## 2015-04-15 MED ORDER — DIPHENHYDRAMINE HCL 50 MG/ML IJ SOLN
50.0000 mg | Freq: Once | INTRAMUSCULAR | Status: AC
  Administered 2015-04-15: 50 mg via INTRAVENOUS

## 2015-04-15 MED ORDER — DIPHENHYDRAMINE HCL 50 MG/ML IJ SOLN
INTRAMUSCULAR | Status: AC
  Filled 2015-04-15: qty 1

## 2015-04-15 MED ORDER — DEXTROSE 5 % IV SOLN
80.0000 mg/m2 | Freq: Once | INTRAVENOUS | Status: AC
  Administered 2015-04-15: 150 mg via INTRAVENOUS
  Filled 2015-04-15: qty 25

## 2015-04-15 MED ORDER — SODIUM CHLORIDE 0.9 % IV SOLN
Freq: Once | INTRAVENOUS | Status: AC
  Administered 2015-04-15: 13:00:00 via INTRAVENOUS

## 2015-04-15 MED ORDER — FAMOTIDINE IN NACL 20-0.9 MG/50ML-% IV SOLN
INTRAVENOUS | Status: AC
  Filled 2015-04-15: qty 50

## 2015-04-15 MED ORDER — SODIUM CHLORIDE 0.9 % IV SOLN
Freq: Once | INTRAVENOUS | Status: AC
  Administered 2015-04-15: 14:00:00 via INTRAVENOUS
  Filled 2015-04-15: qty 4

## 2015-04-15 NOTE — Progress Notes (Signed)
OK to treat with ANC 1.3 per Dr. Marko Plume.  Pt to get neupogen x 3.

## 2015-04-15 NOTE — Patient Instructions (Signed)
[facility] Discharge Instructions for Patients Receiving Chemotherapy  Today you received the following chemotherapy agents Taxol  To help prevent nausea and vomiting after your treatment, we encourage you to take your nausea medication   If you develop nausea and vomiting that is not controlled by your nausea medication, call the clinic.   BELOW ARE SYMPTOMS THAT SHOULD BE REPORTED IMMEDIATELY:  *FEVER GREATER THAN 100.5 F  *CHILLS WITH OR WITHOUT FEVER  NAUSEA AND VOMITING THAT IS NOT CONTROLLED WITH YOUR NAUSEA MEDICATION  *UNUSUAL SHORTNESS OF BREATH  *UNUSUAL BRUISING OR BLEEDING  TENDERNESS IN MOUTH AND THROAT WITH OR WITHOUT PRESENCE OF ULCERS  *URINARY PROBLEMS  *BOWEL PROBLEMS  UNUSUAL RASH Items with * indicate a potential emergency and should be followed up as soon as possible.  Feel free to call the clinic you have any questions or concerns. The clinic phone number is [phone].  Please show the CHEMO ALERT CARD at check-in to the Emergency Department and triage nurse.   

## 2015-04-16 ENCOUNTER — Ambulatory Visit (HOSPITAL_BASED_OUTPATIENT_CLINIC_OR_DEPARTMENT_OTHER): Payer: 59

## 2015-04-16 VITALS — BP 143/39 | HR 90 | Temp 97.9°F

## 2015-04-16 DIAGNOSIS — Z5189 Encounter for other specified aftercare: Secondary | ICD-10-CM | POA: Diagnosis not present

## 2015-04-16 DIAGNOSIS — C541 Malignant neoplasm of endometrium: Secondary | ICD-10-CM | POA: Diagnosis not present

## 2015-04-16 DIAGNOSIS — C482 Malignant neoplasm of peritoneum, unspecified: Secondary | ICD-10-CM | POA: Diagnosis not present

## 2015-04-16 MED ORDER — TBO-FILGRASTIM 300 MCG/0.5ML ~~LOC~~ SOSY
300.0000 ug | PREFILLED_SYRINGE | Freq: Once | SUBCUTANEOUS | Status: AC
  Administered 2015-04-16: 300 ug via SUBCUTANEOUS
  Filled 2015-04-16: qty 0.5

## 2015-04-17 ENCOUNTER — Ambulatory Visit (HOSPITAL_BASED_OUTPATIENT_CLINIC_OR_DEPARTMENT_OTHER): Payer: 59

## 2015-04-17 VITALS — BP 117/60 | HR 107 | Temp 96.9°F | Resp 18

## 2015-04-17 DIAGNOSIS — Z5189 Encounter for other specified aftercare: Secondary | ICD-10-CM

## 2015-04-17 DIAGNOSIS — C482 Malignant neoplasm of peritoneum, unspecified: Secondary | ICD-10-CM

## 2015-04-17 DIAGNOSIS — C541 Malignant neoplasm of endometrium: Secondary | ICD-10-CM

## 2015-04-17 MED ORDER — TBO-FILGRASTIM 300 MCG/0.5ML ~~LOC~~ SOSY
300.0000 ug | PREFILLED_SYRINGE | Freq: Once | SUBCUTANEOUS | Status: AC
  Administered 2015-04-17: 300 ug via SUBCUTANEOUS

## 2015-04-17 NOTE — Patient Instructions (Signed)

## 2015-04-20 ENCOUNTER — Ambulatory Visit (HOSPITAL_BASED_OUTPATIENT_CLINIC_OR_DEPARTMENT_OTHER): Payer: 59

## 2015-04-20 VITALS — BP 126/66 | HR 86 | Temp 98.5°F

## 2015-04-20 DIAGNOSIS — C482 Malignant neoplasm of peritoneum, unspecified: Secondary | ICD-10-CM

## 2015-04-20 DIAGNOSIS — C541 Malignant neoplasm of endometrium: Secondary | ICD-10-CM | POA: Diagnosis not present

## 2015-04-20 DIAGNOSIS — Z5189 Encounter for other specified aftercare: Secondary | ICD-10-CM | POA: Diagnosis not present

## 2015-04-20 MED ORDER — TBO-FILGRASTIM 300 MCG/0.5ML ~~LOC~~ SOSY
300.0000 ug | PREFILLED_SYRINGE | Freq: Once | SUBCUTANEOUS | Status: AC
  Administered 2015-04-20: 300 ug via SUBCUTANEOUS
  Filled 2015-04-20: qty 0.5

## 2015-04-22 ENCOUNTER — Ambulatory Visit (HOSPITAL_BASED_OUTPATIENT_CLINIC_OR_DEPARTMENT_OTHER): Payer: 59

## 2015-04-22 ENCOUNTER — Other Ambulatory Visit (HOSPITAL_BASED_OUTPATIENT_CLINIC_OR_DEPARTMENT_OTHER): Payer: 59

## 2015-04-22 ENCOUNTER — Other Ambulatory Visit: Payer: Self-pay | Admitting: Oncology

## 2015-04-22 ENCOUNTER — Other Ambulatory Visit: Payer: Self-pay | Admitting: *Deleted

## 2015-04-22 VITALS — BP 135/50 | HR 95 | Temp 98.4°F | Resp 18

## 2015-04-22 DIAGNOSIS — C561 Malignant neoplasm of right ovary: Secondary | ICD-10-CM | POA: Diagnosis not present

## 2015-04-22 DIAGNOSIS — C541 Malignant neoplasm of endometrium: Secondary | ICD-10-CM

## 2015-04-22 DIAGNOSIS — Z5111 Encounter for antineoplastic chemotherapy: Secondary | ICD-10-CM | POA: Diagnosis not present

## 2015-04-22 DIAGNOSIS — C482 Malignant neoplasm of peritoneum, unspecified: Secondary | ICD-10-CM

## 2015-04-22 DIAGNOSIS — C55 Malignant neoplasm of uterus, part unspecified: Secondary | ICD-10-CM

## 2015-04-22 LAB — COMPREHENSIVE METABOLIC PANEL (CC13)
ALK PHOS: 130 U/L (ref 40–150)
ALT: 23 U/L (ref 0–55)
AST: 16 U/L (ref 5–34)
Albumin: 3.2 g/dL — ABNORMAL LOW (ref 3.5–5.0)
Anion Gap: 12 mEq/L — ABNORMAL HIGH (ref 3–11)
BILIRUBIN TOTAL: 0.47 mg/dL (ref 0.20–1.20)
BUN: 10.4 mg/dL (ref 7.0–26.0)
CO2: 24 meq/L (ref 22–29)
Calcium: 8.4 mg/dL (ref 8.4–10.4)
Chloride: 103 mEq/L (ref 98–109)
Creatinine: 0.7 mg/dL (ref 0.6–1.1)
EGFR: >90 mL/min/{1.73_m2} (ref >90)
GLUCOSE: 199 mg/dL — AB (ref 70–140)
POTASSIUM: 3.3 meq/L — AB (ref 3.5–5.1)
SODIUM: 139 meq/L (ref 136–145)
TOTAL PROTEIN: 6.4 g/dL (ref 6.4–8.3)

## 2015-04-22 LAB — CBC WITH DIFFERENTIAL/PLATELET
BASO%: 0.4 % (ref 0.0–2.0)
Basophils Absolute: 0 10*3/uL (ref 0.0–0.1)
EOS%: 0 % (ref 0.0–7.0)
Eosinophils Absolute: 0 10*3/uL (ref 0.0–0.5)
HCT: 24.8 % — ABNORMAL LOW (ref 34.8–46.6)
HGB: 8.5 g/dL — ABNORMAL LOW (ref 11.6–15.9)
LYMPH#: 0.6 10*3/uL — AB (ref 0.9–3.3)
LYMPH%: 22.7 % (ref 14.0–49.7)
MCH: 33.5 pg (ref 25.1–34.0)
MCHC: 34.3 g/dL (ref 31.5–36.0)
MCV: 97.6 fL (ref 79.5–101.0)
MONO#: 0.1 10*3/uL (ref 0.1–0.9)
MONO%: 5.3 % (ref 0.0–14.0)
NEUT#: 1.9 10*3/uL (ref 1.5–6.5)
NEUT%: 71.6 % (ref 38.4–76.8)
Platelets: 164 10*3/uL (ref 145–400)
RBC: 2.54 10*6/uL — ABNORMAL LOW (ref 3.70–5.45)
RDW: 19.9 % — AB (ref 11.2–14.5)
WBC: 2.6 10*3/uL — ABNORMAL LOW (ref 3.9–10.3)

## 2015-04-22 MED ORDER — SODIUM CHLORIDE 0.9 % IV SOLN
Freq: Once | INTRAVENOUS | Status: AC
  Administered 2015-04-22: 12:00:00 via INTRAVENOUS

## 2015-04-22 MED ORDER — FAMOTIDINE IN NACL 20-0.9 MG/50ML-% IV SOLN
INTRAVENOUS | Status: AC
  Filled 2015-04-22: qty 50

## 2015-04-22 MED ORDER — DIPHENHYDRAMINE HCL 50 MG/ML IJ SOLN
INTRAMUSCULAR | Status: AC
  Filled 2015-04-22: qty 1

## 2015-04-22 MED ORDER — POTASSIUM CHLORIDE ER 10 MEQ PO TBCR: EXTENDED_RELEASE_TABLET | ORAL | Status: DC

## 2015-04-22 MED ORDER — DIPHENHYDRAMINE HCL 50 MG/ML IJ SOLN
50.0000 mg | Freq: Once | INTRAMUSCULAR | Status: AC
  Administered 2015-04-22: 50 mg via INTRAVENOUS

## 2015-04-22 MED ORDER — SODIUM CHLORIDE 0.9 % IV SOLN
Freq: Once | INTRAVENOUS | Status: AC
  Administered 2015-04-22: 12:00:00 via INTRAVENOUS
  Filled 2015-04-22: qty 4

## 2015-04-22 MED ORDER — FAMOTIDINE IN NACL 20-0.9 MG/50ML-% IV SOLN
20.0000 mg | Freq: Once | INTRAVENOUS | Status: AC
  Administered 2015-04-22: 20 mg via INTRAVENOUS

## 2015-04-22 MED ORDER — PACLITAXEL CHEMO INJECTION 300 MG/50ML
80.0000 mg/m2 | Freq: Once | INTRAVENOUS | Status: AC
  Administered 2015-04-22: 150 mg via INTRAVENOUS
  Filled 2015-04-22: qty 25

## 2015-04-22 NOTE — Progress Notes (Signed)
1200 Spoke with Dr.Livesay re low potassium and elevated glucose. Dr.Livesay states she will order a potassium replacement for patient to take once a day. Pt verbalized understanding of the need to take potassium every day.

## 2015-04-22 NOTE — Patient Instructions (Signed)
[facility] Discharge Instructions for Patients Receiving Chemotherapy  Today you received the following chemotherapy agents:  Taxol  To help prevent nausea and vomiting after your treatment, we encourage you to take your nausea medication as prescribed.   If you develop nausea and vomiting that is not controlled by your nausea medication, call the clinic.   BELOW ARE SYMPTOMS THAT SHOULD BE REPORTED IMMEDIATELY:  *FEVER GREATER THAN 100.5 F  *CHILLS WITH OR WITHOUT FEVER  NAUSEA AND VOMITING THAT IS NOT CONTROLLED WITH YOUR NAUSEA MEDICATION  *UNUSUAL SHORTNESS OF BREATH  *UNUSUAL BRUISING OR BLEEDING  TENDERNESS IN MOUTH AND THROAT WITH OR WITHOUT PRESENCE OF ULCERS  *URINARY PROBLEMS  *BOWEL PROBLEMS  UNUSUAL RASH Items with * indicate a potential emergency and should be followed up as soon as possible.  Feel free to call the clinic you have any questions or concerns. The clinic phone number is [phone].  Please show the CHEMO ALERT CARD at check-in to the Emergency Department and triage nurse.   

## 2015-04-23 ENCOUNTER — Ambulatory Visit (HOSPITAL_BASED_OUTPATIENT_CLINIC_OR_DEPARTMENT_OTHER): Payer: 59

## 2015-04-23 VITALS — BP 115/54 | HR 88 | Temp 98.0°F

## 2015-04-23 DIAGNOSIS — Z5189 Encounter for other specified aftercare: Secondary | ICD-10-CM

## 2015-04-23 DIAGNOSIS — C482 Malignant neoplasm of peritoneum, unspecified: Secondary | ICD-10-CM | POA: Diagnosis not present

## 2015-04-23 DIAGNOSIS — C561 Malignant neoplasm of right ovary: Secondary | ICD-10-CM

## 2015-04-23 MED ORDER — TBO-FILGRASTIM 300 MCG/0.5ML ~~LOC~~ SOSY
300.0000 ug | PREFILLED_SYRINGE | Freq: Once | SUBCUTANEOUS | Status: AC
  Administered 2015-04-23: 300 ug via SUBCUTANEOUS
  Filled 2015-04-23: qty 0.5

## 2015-04-24 ENCOUNTER — Ambulatory Visit (HOSPITAL_BASED_OUTPATIENT_CLINIC_OR_DEPARTMENT_OTHER): Payer: 59

## 2015-04-24 VITALS — BP 128/61 | HR 99 | Temp 98.2°F | Resp 18

## 2015-04-24 DIAGNOSIS — Z5189 Encounter for other specified aftercare: Secondary | ICD-10-CM

## 2015-04-24 DIAGNOSIS — C482 Malignant neoplasm of peritoneum, unspecified: Secondary | ICD-10-CM

## 2015-04-24 DIAGNOSIS — C561 Malignant neoplasm of right ovary: Secondary | ICD-10-CM | POA: Diagnosis not present

## 2015-04-24 MED ORDER — TBO-FILGRASTIM 300 MCG/0.5ML ~~LOC~~ SOSY
300.0000 ug | PREFILLED_SYRINGE | Freq: Once | SUBCUTANEOUS | Status: AC
  Administered 2015-04-24: 300 ug via SUBCUTANEOUS

## 2015-04-24 NOTE — Patient Instructions (Signed)

## 2015-04-25 ENCOUNTER — Other Ambulatory Visit: Payer: Self-pay | Admitting: Oncology

## 2015-04-25 DIAGNOSIS — C482 Malignant neoplasm of peritoneum, unspecified: Secondary | ICD-10-CM

## 2015-04-25 DIAGNOSIS — C541 Malignant neoplasm of endometrium: Secondary | ICD-10-CM

## 2015-04-29 ENCOUNTER — Telehealth: Payer: Self-pay | Admitting: Oncology

## 2015-04-29 ENCOUNTER — Other Ambulatory Visit (HOSPITAL_BASED_OUTPATIENT_CLINIC_OR_DEPARTMENT_OTHER): Payer: 59

## 2015-04-29 ENCOUNTER — Encounter: Payer: Self-pay | Admitting: Oncology

## 2015-04-29 ENCOUNTER — Ambulatory Visit: Payer: 59

## 2015-04-29 ENCOUNTER — Ambulatory Visit (HOSPITAL_BASED_OUTPATIENT_CLINIC_OR_DEPARTMENT_OTHER): Payer: 59 | Admitting: Oncology

## 2015-04-29 VITALS — BP 160/73 | HR 114 | Temp 98.7°F | Ht 67.0 in | Wt 163.6 lb

## 2015-04-29 DIAGNOSIS — C482 Malignant neoplasm of peritoneum, unspecified: Secondary | ICD-10-CM | POA: Diagnosis not present

## 2015-04-29 DIAGNOSIS — D509 Iron deficiency anemia, unspecified: Secondary | ICD-10-CM

## 2015-04-29 DIAGNOSIS — C55 Malignant neoplasm of uterus, part unspecified: Secondary | ICD-10-CM

## 2015-04-29 DIAGNOSIS — T451X5A Adverse effect of antineoplastic and immunosuppressive drugs, initial encounter: Secondary | ICD-10-CM

## 2015-04-29 DIAGNOSIS — Z5189 Encounter for other specified aftercare: Secondary | ICD-10-CM

## 2015-04-29 DIAGNOSIS — C541 Malignant neoplasm of endometrium: Secondary | ICD-10-CM

## 2015-04-29 DIAGNOSIS — K5904 Chronic idiopathic constipation: Secondary | ICD-10-CM

## 2015-04-29 DIAGNOSIS — D701 Agranulocytosis secondary to cancer chemotherapy: Secondary | ICD-10-CM

## 2015-04-29 DIAGNOSIS — D6481 Anemia due to antineoplastic chemotherapy: Secondary | ICD-10-CM

## 2015-04-29 DIAGNOSIS — K121 Other forms of stomatitis: Secondary | ICD-10-CM

## 2015-04-29 LAB — CBC WITH DIFFERENTIAL/PLATELET
BASO%: 0.9 % (ref 0.0–2.0)
Basophils Absolute: 0 10*3/uL (ref 0.0–0.1)
EOS%: 0.1 % (ref 0.0–7.0)
Eosinophils Absolute: 0 10*3/uL (ref 0.0–0.5)
HCT: 24.5 % — ABNORMAL LOW (ref 34.8–46.6)
HEMOGLOBIN: 8.5 g/dL — AB (ref 11.6–15.9)
LYMPH%: 32.4 % (ref 14.0–49.7)
MCH: 34.5 pg — AB (ref 25.1–34.0)
MCHC: 34.8 g/dL (ref 31.5–36.0)
MCV: 99.2 fL (ref 79.5–101.0)
MONO#: 0.1 10*3/uL (ref 0.1–0.9)
MONO%: 6.3 % (ref 0.0–14.0)
NEUT#: 1 10*3/uL — ABNORMAL LOW (ref 1.5–6.5)
NEUT%: 60.3 % (ref 38.4–76.8)
Platelets: 191 10*3/uL (ref 145–400)
RBC: 2.47 10*6/uL — AB (ref 3.70–5.45)
RDW: 22.2 % — ABNORMAL HIGH (ref 11.2–14.5)
WBC: 1.6 10*3/uL — ABNORMAL LOW (ref 3.9–10.3)
lymph#: 0.5 10*3/uL — ABNORMAL LOW (ref 0.9–3.3)

## 2015-04-29 LAB — COMPREHENSIVE METABOLIC PANEL (CC13)
ALBUMIN: 3.3 g/dL — AB (ref 3.5–5.0)
ALK PHOS: 117 U/L (ref 40–150)
ALT: 21 U/L (ref 0–55)
ANION GAP: 11 meq/L (ref 3–11)
AST: 14 U/L (ref 5–34)
BUN: 11.4 mg/dL (ref 7.0–26.0)
CO2: 23 meq/L (ref 22–29)
CREATININE: 0.7 mg/dL (ref 0.6–1.1)
Calcium: 8.6 mg/dL (ref 8.4–10.4)
Chloride: 102 mEq/L (ref 98–109)
Glucose: 184 mg/dl — ABNORMAL HIGH (ref 70–140)
Potassium: 3.7 mEq/L (ref 3.5–5.1)
SODIUM: 136 meq/L (ref 136–145)
TOTAL PROTEIN: 6.6 g/dL (ref 6.4–8.3)
Total Bilirubin: 0.51 mg/dL (ref 0.20–1.20)

## 2015-04-29 MED ORDER — ACYCLOVIR 200 MG PO CAPS: 200.0000 mg | ORAL_CAPSULE | Freq: Two times a day (BID) | ORAL | Status: DC

## 2015-04-29 MED ORDER — TBO-FILGRASTIM 300 MCG/0.5ML ~~LOC~~ SOSY
300.0000 ug | PREFILLED_SYRINGE | Freq: Once | SUBCUTANEOUS | Status: AC
  Administered 2015-04-29: 300 ug via SUBCUTANEOUS
  Filled 2015-04-29: qty 0.5

## 2015-04-29 NOTE — Progress Notes (Signed)
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 Day 1 cycle 5 dose dense carbo taxol due today will be delayed a week due to [place] 1.0. Will increase granix to 3 doses after each chemo; carbo AUC has already been reduced to 4.0.  Patient has a wonderful attitude, tho she has had other problems this week. Bowels did not move adequately for a couple of days despite miralax once daily + senokot and stool softeners 2 daily, with low back pain extending around to abdomen with that constipation. Symptoms resolved after bowels moved well x 3, and she will increase miralax to bid now. She had scratchy throat and "blisters in mouth" last week, despite regular biotene and triamcinolone dental paste. Fatigue with exertion is stable. 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 She is eating, no significant nausea or vomiting. She has not had other abdominal or pelvic pain and no bleeding.   No PAC No genetics testing done  Pre-operative CA 125 1941 on 01-07-15  ONCOLOGIC HISTORY Patient had not had regular medical care since she lost job and medical insurance. She had progressive abdominal swelling over ~ 6 months as well as some postmenopausal bleeding when she was admitted to [facility] in 11-2014 with abdominal pain and presumed peritonitis, initially thought to be either gall bladder related or cirrhosis. Initial paracentesis 12-11-2014 was for 3.5 liters, with malignant cytology consistent with gyn malignancy. Notes refer to CT and MRI "which demonstrated omental thickening, extensive ascites, slight enlargement of the right adnexa and a thickened endometrium (1.1 cm)." She was referred to Dr  Josephina Shih, seen on 12-25-14 with endometrial biopsy not possible due to cervical stenosis. She had second paracentesis for 4.2 liters on 01-04-15. CA 125 on 01-07-15 was 1941 (pre op).She had TAH BSO, omentectomy and radical debulking by Dr Denman George on 01-12-15, with 4 liters of ascites at time of surgery. Findings at surgery were of milial tumor over entire small bowel serosa, mesentery, diaphragm, plaque on bladder and involving cul de sac peritoneum. Surgery was optimal R1 cytoreduction. Pathology [id number]) demonstrated high grade serous carcinoma, IIIC primary peritoneal and at least IB endometrial.. Recommendation was for 6 cycles of carboplatin and taxol, then likely whole pelvic RT + vaginal brachytherapy if she has CR. US paracentesis 01-29-15 for 1.5 liters. She began dose dense carboplatin taxol on 02-04-15.    Review of systems as above, also: No fever. No cough. No bleeding. No increased LE swelling. Remainder of 10 point Review of Systems negative.  Objective:  Vital signs in last 24 hours:  BP 160/73 mmHg  Pulse 114  Temp(Src) 98.7 F (37.1 C)  Ht _0  (1.702 m)  Wt 163 lb 9.6 oz (74.208 kg)  BMI 25.62 kg/m2 Weight down 3 lbs Respirations not labored with exertion in exam room.  Alert, oriented and appropriate. Ambulatory without assistance.  Alopecia  HEENT:PERRL, sclerae not icteric. Oral mucosa moist, aphthous ulcer left lower lip and hard palate, posterior pharynx clear.  Neck supple. No JVD.  Lymphatics:no cervical,supraclavicular, axillary or inguinal adenopathy Resp: clear to auscultation bilaterally and normal percussion bilaterally Cardio: regular rate and rhythm. No gallop. GI: soft, nontender, not distended, no mass or organomegaly. Normally active bowel sounds. Surgical incision not remarkable.  Musculoskeletal/ Extremities: without pitting edema, cords, tenderness Neuro:minimal distal peripheral neuropathy. Otherwise nonfocal. PSYCH appropriate mood and  affect Skin without rash, ecchymosis, petechiae   Lab Results:  Results for orders placed or performed in visit on 04/29/15  CBC with Differential  Result Value Ref Range   WBC 1.6 (L) 3.9 - 10.3 10e3/uL   NEUT# 1.0 (L) 1.5 - 6.5 10e3/uL   HGB 8.5 (L) 11.6 - 15.9 g/dL   HCT 24.5 (L) 34.8 - 46.6 %   Platelets 191 145 - 400 10e3/uL   MCV 99.2 79.5 - 101.0 fL   MCH 34.5 (H) 25.1 - 34.0 pg   MCHC 34.8 31.5 - 36.0 g/dL   RBC 2.47 (L) 3.70 - 5.45 10e6/uL   RDW 22.2 (H) 11.2 - 14.5 %   lymph# 0.5 (L) 0.9 - 3.3 10e3/uL   MONO# 0.1 0.1 - 0.9 10e3/uL   Eosinophils Absolute 0.0 0.0 - 0.5 10e3/uL   Basophils Absolute 0.0 0.0 - 0.1 10e3/uL   NEUT% 60.3 38.4 - 76.8 %   LYMPH% 32.4 14.0 - 49.7 %   MONO% 6.3 0.0 - 14.0 %   EOS% 0.1 0.0 - 7.0 %   BASO% 0.9 0.0 - 2.0 %  Comprehensive metabolic panel (Cmet) - [facility]  Result Value Ref Range   Sodium 136 136 - 145 mEq/L   Potassium 3.7 3.5 - 5.1 mEq/L   Chloride 102 98 - 109 mEq/L   CO2 23 22 - 29 mEq/L   Glucose 184 (H) 70 - 140 mg/dl   BUN 11.4 7.0 - 26.0 mg/dL   Creatinine 0.7 0.6 - 1.1 mg/dL   Total Bilirubin 0.51 0.20 - 1.20 mg/dL   Alkaline Phosphatase 117 40 - 150 U/L   AST 14 5 - 34 U/L   ALT 21 0 - 55 U/L   Total Protein 6.6 6.4 - 8.3 g/dL   Albumin 3.3 (L) 3.5 - 5.0 g/dL   Calcium 8.6 8.4 - 10.4 mg/dL   Anion Gap 11 3 - 11 mEq/L   EGFR >90 >90 ml/min/1.73 m2   CA 125 available after visit down to 136, this having been 519 on 03-25-15 and 2086 on 02-25-15.  Studies/Results:  No results found.  Medications: I have reviewed the patient's current medications. Add acyclovir 200 mg bid for the oral ulcers; continue biotene and prn triamcinolone paste. Granix 300 today, delay chemo x 1 week. Increase miralax to bid. No oral iron as she has had feraheme.  DISCUSSION: will continue to treat as regularly as possible, with continued granix support for counts. She will call if fever or symptoms of infections.  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 Clinically improving with chemotherapy possible thus far.  Will treat with day 1 cycle 5 next week as long as ANC >=1.5 and plt >=100k, with granix x 3 after each chemo. 2.iron deficiency anemia + chemo anemia: post feraheme, stable but may need PRBCs if drops further 3.constipation multifactorial: increase miralax to bid. No oral iron 4.po intake improved 5.GERD begin protonix 6.long tobacco DCd fall 2015.  7.post partial thyroidectomy for goiter ~ 1980, on medication subsequently until ~ 1 year ago also related to lack of insurance then. Dr Philip Aspen managing 8.broken lower tooth, less uncomfortable: trying to schedule with dentist, need to be careful with counts if  invasive procedure. 9.Has been given information on advanced directives   Chemo orders adjusted. All questions answered and patient understands instructions as above. Time spent 25 min  Including >50% counseling and coordination of care.     Jae Skeet P, MD   04/29/2015, 11:13 AM

## 2015-04-29 NOTE — Telephone Encounter (Signed)
Appointments made ad avs printed for patient,email to add 6/23 chemo

## 2015-04-30 ENCOUNTER — Telehealth: Payer: Self-pay

## 2015-04-30 ENCOUNTER — Ambulatory Visit: Payer: 59

## 2015-04-30 LAB — CA 125: CA 125: 136 U/mL — ABNORMAL HIGH (ref <35)

## 2015-04-30 NOTE — Telephone Encounter (Signed)
Told Ms. Egley the results of the CA-125 as noted below by Dr. Marko Plume.

## 2015-04-30 NOTE — Telephone Encounter (Signed)
-----   Message from Gordy Levan, MD sent at 04/30/2015  2:01 PM EDT ----- Labs seen and need follow up: please let her know ca125 down to 136

## 2015-05-01 ENCOUNTER — Ambulatory Visit: Payer: 59

## 2015-05-06 ENCOUNTER — Ambulatory Visit (HOSPITAL_BASED_OUTPATIENT_CLINIC_OR_DEPARTMENT_OTHER): Payer: 59

## 2015-05-06 ENCOUNTER — Other Ambulatory Visit (HOSPITAL_BASED_OUTPATIENT_CLINIC_OR_DEPARTMENT_OTHER): Payer: 59

## 2015-05-06 VITALS — BP 152/72 | HR 84 | Temp 97.8°F | Resp 18

## 2015-05-06 DIAGNOSIS — Z5111 Encounter for antineoplastic chemotherapy: Secondary | ICD-10-CM

## 2015-05-06 DIAGNOSIS — C541 Malignant neoplasm of endometrium: Secondary | ICD-10-CM

## 2015-05-06 DIAGNOSIS — C482 Malignant neoplasm of peritoneum, unspecified: Secondary | ICD-10-CM

## 2015-05-06 DIAGNOSIS — C55 Malignant neoplasm of uterus, part unspecified: Secondary | ICD-10-CM

## 2015-05-06 LAB — CBC WITH DIFFERENTIAL/PLATELET
BASO%: 0.3 % (ref 0.0–2.0)
BASOS ABS: 0 10*3/uL (ref 0.0–0.1)
EOS%: 0 % (ref 0.0–7.0)
Eosinophils Absolute: 0 10*3/uL (ref 0.0–0.5)
HEMATOCRIT: 27.5 % — AB (ref 34.8–46.6)
HEMOGLOBIN: 9.3 g/dL — AB (ref 11.6–15.9)
LYMPH%: 22.8 % (ref 14.0–49.7)
MCH: 34.2 pg — ABNORMAL HIGH (ref 25.1–34.0)
MCHC: 33.9 g/dL (ref 31.5–36.0)
MCV: 100.8 fL (ref 79.5–101.0)
MONO#: 0.2 10*3/uL (ref 0.1–0.9)
MONO%: 8.9 % (ref 0.0–14.0)
NEUT#: 1.5 10*3/uL (ref 1.5–6.5)
NEUT%: 68 % (ref 38.4–76.8)
Platelets: 341 10*3/uL (ref 145–400)
RBC: 2.72 10*6/uL — ABNORMAL LOW (ref 3.70–5.45)
RDW: 20.2 % — ABNORMAL HIGH (ref 11.2–14.5)
WBC: 2.2 10*3/uL — AB (ref 3.9–10.3)
lymph#: 0.5 10*3/uL — ABNORMAL LOW (ref 0.9–3.3)

## 2015-05-06 LAB — COMPREHENSIVE METABOLIC PANEL (CC13)
ALT: 26 U/L (ref 0–55)
AST: 18 U/L (ref 5–34)
Albumin: 3 g/dL — ABNORMAL LOW (ref 3.5–5.0)
Alkaline Phosphatase: 150 U/L (ref 40–150)
Anion Gap: 9 mEq/L (ref 3–11)
BUN: 9.7 mg/dL (ref 7.0–26.0)
CALCIUM: 9.2 mg/dL (ref 8.4–10.4)
CO2: 25 mEq/L (ref 22–29)
CREATININE: 0.7 mg/dL (ref 0.6–1.1)
Chloride: 103 mEq/L (ref 98–109)
EGFR: >90 mL/min/{1.73_m2} (ref >90)
GLUCOSE: 152 mg/dL — AB (ref 70–140)
Potassium: 3.7 mEq/L (ref 3.5–5.1)
Sodium: 138 mEq/L (ref 136–145)
Total Bilirubin: 0.52 mg/dL (ref 0.20–1.20)
Total Protein: 6.5 g/dL (ref 6.4–8.3)

## 2015-05-06 MED ORDER — FAMOTIDINE IN NACL 20-0.9 MG/50ML-% IV SOLN
INTRAVENOUS | Status: AC
Start: 2015-05-06 — End: 2015-05-06
  Filled 2015-05-06: qty 50

## 2015-05-06 MED ORDER — SODIUM CHLORIDE 0.9 % IV SOLN
Freq: Once | INTRAVENOUS | Status: AC
  Administered 2015-05-06: 11:00:00 via INTRAVENOUS

## 2015-05-06 MED ORDER — DIPHENHYDRAMINE HCL 50 MG/ML IJ SOLN
INTRAMUSCULAR | Status: AC
  Filled 2015-05-06: qty 1

## 2015-05-06 MED ORDER — SODIUM CHLORIDE 0.9 % IV SOLN
455.2000 mg | Freq: Once | INTRAVENOUS | Status: AC
  Administered 2015-05-06: 460 mg via INTRAVENOUS
  Filled 2015-05-06: qty 46

## 2015-05-06 MED ORDER — PACLITAXEL CHEMO INJECTION 300 MG/50ML
80.0000 mg/m2 | Freq: Once | INTRAVENOUS | Status: AC
  Administered 2015-05-06: 150 mg via INTRAVENOUS
  Filled 2015-05-06: qty 25

## 2015-05-06 MED ORDER — DIPHENHYDRAMINE HCL 50 MG/ML IJ SOLN
50.0000 mg | Freq: Once | INTRAMUSCULAR | Status: AC
  Administered 2015-05-06: 50 mg via INTRAVENOUS

## 2015-05-06 MED ORDER — FAMOTIDINE IN NACL 20-0.9 MG/50ML-% IV SOLN
20.0000 mg | Freq: Once | INTRAVENOUS | Status: AC
  Administered 2015-05-06: 20 mg via INTRAVENOUS

## 2015-05-06 MED ORDER — DEXAMETHASONE SODIUM PHOSPHATE 100 MG/10ML IJ SOLN
Freq: Once | INTRAMUSCULAR | Status: AC
  Administered 2015-05-06: 11:00:00 via INTRAVENOUS
  Filled 2015-05-06: qty 8

## 2015-05-06 NOTE — Progress Notes (Signed)
Dr. Marko Plume reviewed lab results, okay to proceed with treatment today per MD.

## 2015-05-06 NOTE — Patient Instructions (Signed)
[facility] Discharge Instructions for Patients Receiving Chemotherapy  Today you received the following chemotherapy agents: Taxol, Carboplatin   To help prevent nausea and vomiting after your treatment, we encourage you to take your nausea medication as directed.    If you develop nausea and vomiting that is not controlled by your nausea medication, call the clinic.   BELOW ARE SYMPTOMS THAT SHOULD BE REPORTED IMMEDIATELY:  *FEVER GREATER THAN 100.5 F  *CHILLS WITH OR WITHOUT FEVER  NAUSEA AND VOMITING THAT IS NOT CONTROLLED WITH YOUR NAUSEA MEDICATION  *UNUSUAL SHORTNESS OF BREATH  *UNUSUAL BRUISING OR BLEEDING  TENDERNESS IN MOUTH AND THROAT WITH OR WITHOUT PRESENCE OF ULCERS  *URINARY PROBLEMS  *BOWEL PROBLEMS  UNUSUAL RASH Items with * indicate a potential emergency and should be followed up as soon as possible.  Feel free to call the clinic you have any questions or concerns. The clinic phone number is [phone].  Please show the CHEMO ALERT CARD at check-in to the Emergency Department and triage nurse.   

## 2015-05-07 ENCOUNTER — Ambulatory Visit (HOSPITAL_BASED_OUTPATIENT_CLINIC_OR_DEPARTMENT_OTHER): Payer: 59

## 2015-05-07 VITALS — BP 120/56 | HR 79 | Temp 97.7°F

## 2015-05-07 DIAGNOSIS — C482 Malignant neoplasm of peritoneum, unspecified: Secondary | ICD-10-CM

## 2015-05-07 DIAGNOSIS — Z5189 Encounter for other specified aftercare: Secondary | ICD-10-CM | POA: Diagnosis not present

## 2015-05-07 DIAGNOSIS — C541 Malignant neoplasm of endometrium: Secondary | ICD-10-CM | POA: Diagnosis not present

## 2015-05-07 MED ORDER — TBO-FILGRASTIM 300 MCG/0.5ML ~~LOC~~ SOSY
300.0000 ug | PREFILLED_SYRINGE | Freq: Once | SUBCUTANEOUS | Status: AC
  Administered 2015-05-07: 300 ug via SUBCUTANEOUS
  Filled 2015-05-07: qty 0.5

## 2015-05-08 ENCOUNTER — Ambulatory Visit (HOSPITAL_BASED_OUTPATIENT_CLINIC_OR_DEPARTMENT_OTHER): Payer: 59

## 2015-05-08 VITALS — BP 136/64 | HR 76 | Temp 99.0°F | Resp 18

## 2015-05-08 DIAGNOSIS — C541 Malignant neoplasm of endometrium: Secondary | ICD-10-CM

## 2015-05-08 DIAGNOSIS — Z5189 Encounter for other specified aftercare: Secondary | ICD-10-CM

## 2015-05-08 DIAGNOSIS — C482 Malignant neoplasm of peritoneum, unspecified: Secondary | ICD-10-CM

## 2015-05-08 MED ORDER — TBO-FILGRASTIM 300 MCG/0.5ML ~~LOC~~ SOSY
300.0000 ug | PREFILLED_SYRINGE | Freq: Once | SUBCUTANEOUS | Status: AC
  Administered 2015-05-08: 300 ug via SUBCUTANEOUS

## 2015-05-08 NOTE — Patient Instructions (Signed)

## 2015-05-09 ENCOUNTER — Other Ambulatory Visit: Payer: Self-pay | Admitting: Oncology

## 2015-05-10 ENCOUNTER — Ambulatory Visit (HOSPITAL_BASED_OUTPATIENT_CLINIC_OR_DEPARTMENT_OTHER): Payer: 59

## 2015-05-10 VITALS — BP 157/65 | HR 106 | Temp 97.9°F

## 2015-05-10 DIAGNOSIS — C482 Malignant neoplasm of peritoneum, unspecified: Secondary | ICD-10-CM

## 2015-05-10 MED ORDER — TBO-FILGRASTIM 300 MCG/0.5ML ~~LOC~~ SOSY
300.0000 ug | PREFILLED_SYRINGE | Freq: Once | SUBCUTANEOUS | Status: AC
  Administered 2015-05-10: 300 ug via SUBCUTANEOUS
  Filled 2015-05-10: qty 0.5

## 2015-05-13 ENCOUNTER — Encounter: Payer: Self-pay | Admitting: Oncology

## 2015-05-13 ENCOUNTER — Ambulatory Visit (HOSPITAL_BASED_OUTPATIENT_CLINIC_OR_DEPARTMENT_OTHER): Payer: 59 | Admitting: Oncology

## 2015-05-13 ENCOUNTER — Ambulatory Visit (HOSPITAL_BASED_OUTPATIENT_CLINIC_OR_DEPARTMENT_OTHER): Payer: 59

## 2015-05-13 ENCOUNTER — Other Ambulatory Visit (HOSPITAL_BASED_OUTPATIENT_CLINIC_OR_DEPARTMENT_OTHER): Payer: 59

## 2015-05-13 ENCOUNTER — Ambulatory Visit: Payer: 59 | Admitting: Nutrition

## 2015-05-13 DIAGNOSIS — C541 Malignant neoplasm of endometrium: Secondary | ICD-10-CM

## 2015-05-13 DIAGNOSIS — C55 Malignant neoplasm of uterus, part unspecified: Secondary | ICD-10-CM

## 2015-05-13 DIAGNOSIS — D6481 Anemia due to antineoplastic chemotherapy: Secondary | ICD-10-CM

## 2015-05-13 DIAGNOSIS — T451X5A Adverse effect of antineoplastic and immunosuppressive drugs, initial encounter: Secondary | ICD-10-CM

## 2015-05-13 DIAGNOSIS — R110 Nausea: Secondary | ICD-10-CM

## 2015-05-13 DIAGNOSIS — C482 Malignant neoplasm of peritoneum, unspecified: Secondary | ICD-10-CM | POA: Diagnosis not present

## 2015-05-13 DIAGNOSIS — Z5111 Encounter for antineoplastic chemotherapy: Secondary | ICD-10-CM

## 2015-05-13 LAB — CBC WITH DIFFERENTIAL/PLATELET
BASO%: 0.7 % (ref 0.0–2.0)
BASOS ABS: 0 10*3/uL (ref 0.0–0.1)
EOS%: 0.3 % (ref 0.0–7.0)
Eosinophils Absolute: 0 10*3/uL (ref 0.0–0.5)
HEMATOCRIT: 30.2 % — AB (ref 34.8–46.6)
HEMOGLOBIN: 10.2 g/dL — AB (ref 11.6–15.9)
LYMPH#: 0.9 10*3/uL (ref 0.9–3.3)
LYMPH%: 22.4 % (ref 14.0–49.7)
MCH: 33.4 pg (ref 25.1–34.0)
MCHC: 33.7 g/dL (ref 31.5–36.0)
MCV: 99 fL (ref 79.5–101.0)
MONO#: 0.1 10*3/uL (ref 0.1–0.9)
MONO%: 3.2 % (ref 0.0–14.0)
NEUT#: 3 10*3/uL (ref 1.5–6.5)
NEUT%: 73.4 % (ref 38.4–76.8)
Platelets: 380 10*3/uL (ref 145–400)
RBC: 3.05 10*6/uL — ABNORMAL LOW (ref 3.70–5.45)
RDW: 18.8 % — ABNORMAL HIGH (ref 11.2–14.5)
WBC: 4.1 10*3/uL (ref 3.9–10.3)

## 2015-05-13 LAB — COMPREHENSIVE METABOLIC PANEL (CC13)
ALK PHOS: 156 U/L — AB (ref 40–150)
ALT: 15 U/L (ref 0–55)
AST: 15 U/L (ref 5–34)
Albumin: 3.4 g/dL — ABNORMAL LOW (ref 3.5–5.0)
Anion Gap: 9 mEq/L (ref 3–11)
BUN: 11.2 mg/dL (ref 7.0–26.0)
CO2: 26 mEq/L (ref 22–29)
Calcium: 9.1 mg/dL (ref 8.4–10.4)
Chloride: 102 mEq/L (ref 98–109)
Creatinine: 0.7 mg/dL (ref 0.6–1.1)
EGFR: >90 mL/min/{1.73_m2} (ref >90)
GLUCOSE: 123 mg/dL (ref 70–140)
Potassium: 3.8 mEq/L (ref 3.5–5.1)
Sodium: 136 mEq/L (ref 136–145)
TOTAL PROTEIN: 6.8 g/dL (ref 6.4–8.3)
Total Bilirubin: 0.28 mg/dL (ref 0.20–1.20)

## 2015-05-13 MED ORDER — ONDANSETRON HCL 8 MG PO TABS: 8.0000 mg | ORAL_TABLET | Freq: Three times a day (TID) | ORAL | Status: DC | PRN

## 2015-05-13 MED ORDER — FAMOTIDINE IN NACL 20-0.9 MG/50ML-% IV SOLN
20.0000 mg | Freq: Once | INTRAVENOUS | Status: AC
  Administered 2015-05-13: 20 mg via INTRAVENOUS

## 2015-05-13 MED ORDER — PACLITAXEL CHEMO INJECTION 300 MG/50ML
80.0000 mg/m2 | Freq: Once | INTRAVENOUS | Status: AC
  Administered 2015-05-13: 150 mg via INTRAVENOUS
  Filled 2015-05-13: qty 25

## 2015-05-13 MED ORDER — DIPHENHYDRAMINE HCL 50 MG/ML IJ SOLN
50.0000 mg | Freq: Once | INTRAMUSCULAR | Status: AC
  Administered 2015-05-13: 50 mg via INTRAVENOUS

## 2015-05-13 MED ORDER — DIPHENHYDRAMINE HCL 50 MG/ML IJ SOLN
INTRAMUSCULAR | Status: AC
  Filled 2015-05-13: qty 1

## 2015-05-13 MED ORDER — FAMOTIDINE IN NACL 20-0.9 MG/50ML-% IV SOLN
INTRAVENOUS | Status: AC
  Filled 2015-05-13: qty 50

## 2015-05-13 MED ORDER — SODIUM CHLORIDE 0.9 % IV SOLN
Freq: Once | INTRAVENOUS | Status: AC
  Administered 2015-05-13: 12:00:00 via INTRAVENOUS

## 2015-05-13 MED ORDER — SODIUM CHLORIDE 0.9 % IV SOLN
Freq: Once | INTRAVENOUS | Status: AC
  Administered 2015-05-13: 13:00:00 via INTRAVENOUS
  Filled 2015-05-13: qty 4

## 2015-05-13 NOTE — Progress Notes (Signed)
Nutrition follow up completed with patient diagnosed with peritoneal carcinomatosis. Patient continues to feel well. States she is eating well. Patient doesn't use oral nutrition supplements. Weight decreased and documented as 163.6 pounds down from 166.4 pounds May 19.  Nutrition Diagnosis: Food and Nutrition Related Knowledge Deficit resolved.  Patient educated to increase calories and protein to promote weight stability. Patient will continue increased fluids. Teach back method used. Patient will tolerate increased calories and protein to promote weight maintenance.  Next Visit:To be scheduled as needed.

## 2015-05-13 NOTE — Progress Notes (Signed)
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 She is due day 8 cycle 5 dose dense carbo taxol today.  She is maintaining ANC now using granix x 3 days after each treatment.  Patient had much more nausea and fatigue after day 1 cycle 5 last week, lasting 3 days, during which time she was mostly in bed. This was the worst that she has had, but is feeling some better now including able to eat and to drink fluids. Bowels have moved well. She has more numbness onto palms and slightly more with feet. Peripheral IV access is not easy, however she still prefers this to central line.   No PAC No genetics testing done  Pre-operative CA 125 1941 on 01-07-15  Young adult family member has been hospitalized at [facility] x 2 weeks, possibly with respiratory virus. I have told patient that it would be best if she was not around family members who are visiting the hospital or their close contacts.  ONCOLOGIC HISTORY Patient had not had regular medical care since she lost job and medical insurance. She had progressive abdominal swelling over ~ 6 months as well as some postmenopausal bleeding when she was admitted to [facility] in 11-2014 with abdominal pain and presumed peritonitis, initially thought to be either gall bladder related or cirrhosis. Initial paracentesis 12-11-2014 was for 3.5 liters, with malignant cytology consistent with gyn malignancy. Notes refer to CT and MRI "which demonstrated omental thickening, extensive ascites, slight enlargement of the right adnexa and a thickened endometrium (1.1 cm)." She was referred to Dr Josephina Shih, seen on 12-25-14 with endometrial biopsy not  possible due to cervical stenosis. She had second paracentesis for 4.2 liters on 01-04-15. CA 125 on 01-07-15 was 1941 (pre op).She had TAH BSO, omentectomy and radical debulking by Dr Denman George on 01-12-15, with 4 liters of ascites at time of surgery. Findings at surgery were of milial tumor over entire small bowel serosa, mesentery, diaphragm, plaque on bladder and involving cul de sac peritoneum. Surgery was optimal R1 cytoreduction. Pathology [id number]) demonstrated high grade serous carcinoma, IIIC primary peritoneal and at least IB endometrial.. Recommendation was for 6 cycles of carboplatin and taxol, then likely whole pelvic RT + vaginal brachytherapy if she has CR. US paracentesis 01-29-15 for 1.5 liters. She began dose dense carboplatin taxol on 02-04-15.  Review of systems as above, also: No fever. SOB no worse. No bladder symptoms. No bleeding Remainder of 10 point Review of Systems negative.  Objective:  Vital signs in last 24 hours: completed but not on flow sheets at time of transcription    Alert, oriented and appropriate. Ambulatory without difficulty. Respirations not labored at rest. Complete alopecia  HEENT:PERRL, sclerae not icteric. Oral mucosa moist without lesions, posterior pharynx clear.  Neck supple. No JVD.  Lymphatics:no cervical,supraclavicular, inguinal adenopathy Resp: clear to auscultation bilaterally and normal percussion bilaterally Cardio: regular rate and rhythm. No gallop. GI: soft, nontender, not distended, no mass or organomegaly. Some bowel sounds. Surgical incision not remarkable. Musculoskeletal/ Extremities: without pitting edema, cords, tenderness Neuro: no peripheral neuropathy. Otherwise nonfocal Skin without rash, petechiae. Resolving ecchymosis left forearm at site of attempted IV access, no cords or swelling   Lab Results:  Results  for orders placed or performed in visit on 05/13/15  CBC with Differential  Result Value Ref Range   WBC 4.1 3.9 -  10.3 10e3/uL   NEUT# 3.0 1.5 - 6.5 10e3/uL   HGB 10.2 (L) 11.6 - 15.9 g/dL   HCT 30.2 (L) 34.8 - 46.6 %   Platelets 380 145 - 400 10e3/uL   MCV 99.0 79.5 - 101.0 fL   MCH 33.4 25.1 - 34.0 pg   MCHC 33.7 31.5 - 36.0 g/dL   RBC 3.05 (L) 3.70 - 5.45 10e6/uL   RDW 18.8 (H) 11.2 - 14.5 %   lymph# 0.9 0.9 - 3.3 10e3/uL   MONO# 0.1 0.1 - 0.9 10e3/uL   Eosinophils Absolute 0.0 0.0 - 0.5 10e3/uL   Basophils Absolute 0.0 0.0 - 0.1 10e3/uL   NEUT% 73.4 38.4 - 76.8 %   LYMPH% 22.4 14.0 - 49.7 %   MONO% 3.2 0.0 - 14.0 %   EOS% 0.3 0.0 - 7.0 %   BASO% 0.7 0.0 - 2.0 %    CMET available after visit normal with exception of AP 156 and alb 3.4 (improved), including creatinine 0.7 and total protein 6.8 with other LFTs wnl.  Studies/Results:  No results found.  Medications: I have reviewed the patient's current medications. She is not having pain, used oxycodone when "stomach felt bad" after last chemo; not refilled now.  DISCUSSION: hopefully single agent taxol will not be as rigorous as the [place] + taxol; still seems reasonable to continue taxol tho need to watch peripheral neuropathy closely and may need to adjust with cycle 6.  Per Dr Serita Grit note 01-22-15, patient needs visit back with gyn onc after chemo and prior to radiation. On present schedule cycle 6 will complete on 06-10-15. Request sent now for CT CAP shortly prior to visit with Dr Denman George in August, then Dr Sondra Come.    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 She has had difficult time with chemo, including more peripheral neuropathy symptoms now. May need to adjust for last few treatments. Will need CT CAP after chemo, reevaluation by gyn onc and possible whole pelvic radiation + vaginal brachytherapy. 2.iron  deficiency anemia + chemo anemia: post feraheme, hgb some better today. 3.constipation multifactorial: better with miralax to bid. No oral iron 4. Increased nausea and fatigue after day 1 cycle 5. She understands that she needs to call if these symptoms recur this week; may need extra IVF after day 1 cycle 6. 5.GERD begin protonix 6.long tobacco DCd fall 2015 7.post partial thyroidectomy for goiter ~ 1980, on medication subsequently until ~ 1 year ago also related to lack of insurance then. Dr Philip Aspen managing 8.broken lower tooth, less uncomfortable: trying to schedule with dentist, need to be careful with counts if invasive procedure. 9.Has been given information on advanced directives 10. Difficult IV access noted in chemo orders  Chemo and granix orders confirmed. Questions answered. Time spent 25 min including >50% counseling and coordination of care.    Heyward Douthit P, MD   05/13/2015, 9:56 AM

## 2015-05-14 ENCOUNTER — Ambulatory Visit (HOSPITAL_BASED_OUTPATIENT_CLINIC_OR_DEPARTMENT_OTHER): Payer: 59

## 2015-05-14 VITALS — BP 123/55 | HR 78 | Temp 97.9°F

## 2015-05-14 DIAGNOSIS — Z5189 Encounter for other specified aftercare: Secondary | ICD-10-CM

## 2015-05-14 DIAGNOSIS — C482 Malignant neoplasm of peritoneum, unspecified: Secondary | ICD-10-CM

## 2015-05-14 MED ORDER — TBO-FILGRASTIM 300 MCG/0.5ML ~~LOC~~ SOSY
300.0000 ug | PREFILLED_SYRINGE | Freq: Once | SUBCUTANEOUS | Status: AC
  Administered 2015-05-14: 300 ug via SUBCUTANEOUS
  Filled 2015-05-14: qty 0.5

## 2015-05-15 ENCOUNTER — Other Ambulatory Visit: Payer: Self-pay | Admitting: Oncology

## 2015-05-15 ENCOUNTER — Ambulatory Visit (HOSPITAL_BASED_OUTPATIENT_CLINIC_OR_DEPARTMENT_OTHER): Payer: 59

## 2015-05-15 VITALS — BP 134/60 | HR 79 | Temp 96.9°F | Resp 18

## 2015-05-15 DIAGNOSIS — C482 Malignant neoplasm of peritoneum, unspecified: Secondary | ICD-10-CM

## 2015-05-15 DIAGNOSIS — Z5189 Encounter for other specified aftercare: Secondary | ICD-10-CM | POA: Diagnosis not present

## 2015-05-15 DIAGNOSIS — C541 Malignant neoplasm of endometrium: Secondary | ICD-10-CM

## 2015-05-15 MED ORDER — TBO-FILGRASTIM 300 MCG/0.5ML ~~LOC~~ SOSY
300.0000 ug | PREFILLED_SYRINGE | Freq: Once | SUBCUTANEOUS | Status: AC
  Administered 2015-05-15: 300 ug via SUBCUTANEOUS

## 2015-05-15 NOTE — Patient Instructions (Signed)

## 2015-05-17 ENCOUNTER — Telehealth: Payer: Self-pay | Admitting: Oncology

## 2015-05-17 ENCOUNTER — Ambulatory Visit (HOSPITAL_BASED_OUTPATIENT_CLINIC_OR_DEPARTMENT_OTHER): Payer: 59

## 2015-05-17 VITALS — BP 134/59 | HR 88 | Temp 98.6°F | Resp 18

## 2015-05-17 DIAGNOSIS — C482 Malignant neoplasm of peritoneum, unspecified: Secondary | ICD-10-CM

## 2015-05-17 DIAGNOSIS — Z5189 Encounter for other specified aftercare: Secondary | ICD-10-CM | POA: Diagnosis not present

## 2015-05-17 DIAGNOSIS — C541 Malignant neoplasm of endometrium: Secondary | ICD-10-CM

## 2015-05-17 MED ORDER — TBO-FILGRASTIM 300 MCG/0.5ML ~~LOC~~ SOSY
300.0000 ug | PREFILLED_SYRINGE | Freq: Once | SUBCUTANEOUS | Status: AC
  Administered 2015-05-17: 300 ug via SUBCUTANEOUS
  Filled 2015-05-17: qty 0.5

## 2015-05-17 NOTE — Telephone Encounter (Signed)
Appointments made per pof and patient will get a new schedule/avs at  todays appointment

## 2015-05-19 ENCOUNTER — Telehealth: Payer: Self-pay | Admitting: Oncology

## 2015-05-19 NOTE — Telephone Encounter (Signed)
Patient called in to reschedule her 7/1 inj to a later time,done

## 2015-05-20 ENCOUNTER — Other Ambulatory Visit (HOSPITAL_BASED_OUTPATIENT_CLINIC_OR_DEPARTMENT_OTHER): Payer: 59

## 2015-05-20 ENCOUNTER — Ambulatory Visit (HOSPITAL_BASED_OUTPATIENT_CLINIC_OR_DEPARTMENT_OTHER): Payer: 59

## 2015-05-20 VITALS — BP 154/79 | HR 108 | Temp 98.1°F | Resp 20

## 2015-05-20 DIAGNOSIS — C55 Malignant neoplasm of uterus, part unspecified: Secondary | ICD-10-CM

## 2015-05-20 DIAGNOSIS — C541 Malignant neoplasm of endometrium: Secondary | ICD-10-CM

## 2015-05-20 DIAGNOSIS — Z5111 Encounter for antineoplastic chemotherapy: Secondary | ICD-10-CM | POA: Diagnosis not present

## 2015-05-20 DIAGNOSIS — C482 Malignant neoplasm of peritoneum, unspecified: Secondary | ICD-10-CM

## 2015-05-20 LAB — CBC WITH DIFFERENTIAL/PLATELET
BASO%: 0.6 % (ref 0.0–2.0)
BASOS ABS: 0 10*3/uL (ref 0.0–0.1)
EOS ABS: 0 10*3/uL (ref 0.0–0.5)
EOS%: 0 % (ref 0.0–7.0)
HCT: 29.3 % — ABNORMAL LOW (ref 34.8–46.6)
HEMOGLOBIN: 10 g/dL — AB (ref 11.6–15.9)
LYMPH%: 17.4 % (ref 14.0–49.7)
MCH: 34.5 pg — ABNORMAL HIGH (ref 25.1–34.0)
MCHC: 34.1 g/dL (ref 31.5–36.0)
MCV: 101 fL (ref 79.5–101.0)
MONO#: 0.1 10*3/uL (ref 0.1–0.9)
MONO%: 1 % (ref 0.0–14.0)
NEUT#: 4 10*3/uL (ref 1.5–6.5)
NEUT%: 81 % — ABNORMAL HIGH (ref 38.4–76.8)
Platelets: 234 10*3/uL (ref 145–400)
RBC: 2.9 10*6/uL — ABNORMAL LOW (ref 3.70–5.45)
RDW: 16.6 % — ABNORMAL HIGH (ref 11.2–14.5)
WBC: 5 10*3/uL (ref 3.9–10.3)
lymph#: 0.9 10*3/uL (ref 0.9–3.3)

## 2015-05-20 LAB — COMPREHENSIVE METABOLIC PANEL (CC13)
ALBUMIN: 3.4 g/dL — AB (ref 3.5–5.0)
ALT: 22 U/L (ref 0–55)
AST: 16 U/L (ref 5–34)
Alkaline Phosphatase: 141 U/L (ref 40–150)
Anion Gap: 12 mEq/L — ABNORMAL HIGH (ref 3–11)
BUN: 9.6 mg/dL (ref 7.0–26.0)
CO2: 23 meq/L (ref 22–29)
Calcium: 9 mg/dL (ref 8.4–10.4)
Chloride: 103 mEq/L (ref 98–109)
Creatinine: 0.7 mg/dL (ref 0.6–1.1)
EGFR: 87 mL/min/{1.73_m2} — ABNORMAL LOW (ref >90)
Glucose: 202 mg/dl — ABNORMAL HIGH (ref 70–140)
Potassium: 3.7 mEq/L (ref 3.5–5.1)
Sodium: 138 mEq/L (ref 136–145)
TOTAL PROTEIN: 6.5 g/dL (ref 6.4–8.3)
Total Bilirubin: 0.36 mg/dL (ref 0.20–1.20)

## 2015-05-20 MED ORDER — SODIUM CHLORIDE 0.9 % IV SOLN
Freq: Once | INTRAVENOUS | Status: AC
  Administered 2015-05-20: 13:00:00 via INTRAVENOUS
  Filled 2015-05-20: qty 4

## 2015-05-20 MED ORDER — FAMOTIDINE IN NACL 20-0.9 MG/50ML-% IV SOLN
INTRAVENOUS | Status: AC
  Filled 2015-05-20: qty 50

## 2015-05-20 MED ORDER — SODIUM CHLORIDE 0.9 % IV SOLN
Freq: Once | INTRAVENOUS | Status: AC
  Administered 2015-05-20: 12:00:00 via INTRAVENOUS

## 2015-05-20 MED ORDER — DIPHENHYDRAMINE HCL 50 MG/ML IJ SOLN
INTRAMUSCULAR | Status: AC
  Filled 2015-05-20: qty 1

## 2015-05-20 MED ORDER — FAMOTIDINE IN NACL 20-0.9 MG/50ML-% IV SOLN
20.0000 mg | Freq: Once | INTRAVENOUS | Status: AC
  Administered 2015-05-20 (×2): 20 mg via INTRAVENOUS

## 2015-05-20 MED ORDER — PACLITAXEL CHEMO INJECTION 300 MG/50ML
80.0000 mg/m2 | Freq: Once | INTRAVENOUS | Status: AC
  Administered 2015-05-20: 150 mg via INTRAVENOUS
  Filled 2015-05-20: qty 25

## 2015-05-20 MED ORDER — DIPHENHYDRAMINE HCL 50 MG/ML IJ SOLN
50.0000 mg | Freq: Once | INTRAMUSCULAR | Status: AC
  Administered 2015-05-20: 50 mg via INTRAVENOUS

## 2015-05-20 NOTE — Patient Instructions (Signed)
[facility] Discharge Instructions for Patients Receiving Chemotherapy  Today you received the following chemotherapy agents Taxol  To help prevent nausea and vomiting after your treatment, we encourage you to take your nausea medication as needed   If you develop nausea and vomiting that is not controlled by your nausea medication, call the clinic.   BELOW ARE SYMPTOMS THAT SHOULD BE REPORTED IMMEDIATELY:  *FEVER GREATER THAN 100.5 F  *CHILLS WITH OR WITHOUT FEVER  NAUSEA AND VOMITING THAT IS NOT CONTROLLED WITH YOUR NAUSEA MEDICATION  *UNUSUAL SHORTNESS OF BREATH  *UNUSUAL BRUISING OR BLEEDING  TENDERNESS IN MOUTH AND THROAT WITH OR WITHOUT PRESENCE OF ULCERS  *URINARY PROBLEMS  *BOWEL PROBLEMS  UNUSUAL RASH Items with * indicate a potential emergency and should be followed up as soon as possible.  Feel free to call the clinic you have any questions or concerns. The clinic phone number is [phone].  Please show the CHEMO ALERT CARD at check-in to the Emergency Department and triage nurse.   

## 2015-05-21 ENCOUNTER — Ambulatory Visit (HOSPITAL_BASED_OUTPATIENT_CLINIC_OR_DEPARTMENT_OTHER): Payer: 59

## 2015-05-21 ENCOUNTER — Ambulatory Visit: Payer: 59

## 2015-05-21 VITALS — BP 123/59 | HR 93 | Temp 97.5°F

## 2015-05-21 DIAGNOSIS — C541 Malignant neoplasm of endometrium: Secondary | ICD-10-CM

## 2015-05-21 DIAGNOSIS — C482 Malignant neoplasm of peritoneum, unspecified: Secondary | ICD-10-CM | POA: Diagnosis not present

## 2015-05-21 DIAGNOSIS — Z5189 Encounter for other specified aftercare: Secondary | ICD-10-CM | POA: Diagnosis not present

## 2015-05-21 MED ORDER — TBO-FILGRASTIM 300 MCG/0.5ML ~~LOC~~ SOSY
300.0000 ug | PREFILLED_SYRINGE | Freq: Once | SUBCUTANEOUS | Status: AC
  Administered 2015-05-21: 300 ug via SUBCUTANEOUS
  Filled 2015-05-21: qty 0.5

## 2015-05-22 ENCOUNTER — Ambulatory Visit (HOSPITAL_BASED_OUTPATIENT_CLINIC_OR_DEPARTMENT_OTHER): Payer: 59

## 2015-05-22 VITALS — BP 136/89 | HR 97 | Temp 97.7°F

## 2015-05-22 DIAGNOSIS — Z5189 Encounter for other specified aftercare: Secondary | ICD-10-CM

## 2015-05-22 DIAGNOSIS — C482 Malignant neoplasm of peritoneum, unspecified: Secondary | ICD-10-CM

## 2015-05-22 DIAGNOSIS — C541 Malignant neoplasm of endometrium: Secondary | ICD-10-CM

## 2015-05-22 MED ORDER — TBO-FILGRASTIM 300 MCG/0.5ML ~~LOC~~ SOSY
300.0000 ug | PREFILLED_SYRINGE | Freq: Once | SUBCUTANEOUS | Status: AC
  Administered 2015-05-22: 300 ug via SUBCUTANEOUS

## 2015-05-22 NOTE — Patient Instructions (Signed)

## 2015-05-24 ENCOUNTER — Other Ambulatory Visit: Payer: Self-pay | Admitting: Oncology

## 2015-05-24 DIAGNOSIS — C482 Malignant neoplasm of peritoneum, unspecified: Secondary | ICD-10-CM

## 2015-05-25 ENCOUNTER — Ambulatory Visit (HOSPITAL_BASED_OUTPATIENT_CLINIC_OR_DEPARTMENT_OTHER): Payer: 59

## 2015-05-25 VITALS — BP 150/70 | HR 90 | Temp 98.1°F

## 2015-05-25 DIAGNOSIS — C541 Malignant neoplasm of endometrium: Secondary | ICD-10-CM | POA: Diagnosis not present

## 2015-05-25 DIAGNOSIS — C482 Malignant neoplasm of peritoneum, unspecified: Secondary | ICD-10-CM | POA: Diagnosis not present

## 2015-05-25 DIAGNOSIS — Z5189 Encounter for other specified aftercare: Secondary | ICD-10-CM | POA: Diagnosis not present

## 2015-05-25 MED ORDER — TBO-FILGRASTIM 300 MCG/0.5ML ~~LOC~~ SOSY
300.0000 ug | PREFILLED_SYRINGE | Freq: Once | SUBCUTANEOUS | Status: AC
  Administered 2015-05-25: 300 ug via SUBCUTANEOUS
  Filled 2015-05-25: qty 0.5

## 2015-05-27 ENCOUNTER — Ambulatory Visit (HOSPITAL_BASED_OUTPATIENT_CLINIC_OR_DEPARTMENT_OTHER): Payer: 59

## 2015-05-27 ENCOUNTER — Ambulatory Visit (HOSPITAL_BASED_OUTPATIENT_CLINIC_OR_DEPARTMENT_OTHER): Payer: 59 | Admitting: Oncology

## 2015-05-27 ENCOUNTER — Other Ambulatory Visit (HOSPITAL_BASED_OUTPATIENT_CLINIC_OR_DEPARTMENT_OTHER): Payer: 59

## 2015-05-27 ENCOUNTER — Encounter: Payer: Self-pay | Admitting: Oncology

## 2015-05-27 ENCOUNTER — Telehealth: Payer: Self-pay | Admitting: Oncology

## 2015-05-27 VITALS — BP 146/71 | HR 90 | Temp 98.1°F | Resp 18 | Ht 67.0 in | Wt 167.6 lb

## 2015-05-27 DIAGNOSIS — D509 Iron deficiency anemia, unspecified: Secondary | ICD-10-CM | POA: Diagnosis not present

## 2015-05-27 DIAGNOSIS — N950 Postmenopausal bleeding: Secondary | ICD-10-CM

## 2015-05-27 DIAGNOSIS — C482 Malignant neoplasm of peritoneum, unspecified: Secondary | ICD-10-CM

## 2015-05-27 DIAGNOSIS — C541 Malignant neoplasm of endometrium: Secondary | ICD-10-CM

## 2015-05-27 DIAGNOSIS — N390 Urinary tract infection, site not specified: Secondary | ICD-10-CM

## 2015-05-27 DIAGNOSIS — R5383 Other fatigue: Secondary | ICD-10-CM

## 2015-05-27 DIAGNOSIS — R110 Nausea: Secondary | ICD-10-CM

## 2015-05-27 DIAGNOSIS — D6481 Anemia due to antineoplastic chemotherapy: Secondary | ICD-10-CM

## 2015-05-27 DIAGNOSIS — R180 Malignant ascites: Secondary | ICD-10-CM

## 2015-05-27 DIAGNOSIS — G620 Drug-induced polyneuropathy: Secondary | ICD-10-CM

## 2015-05-27 DIAGNOSIS — K5900 Constipation, unspecified: Secondary | ICD-10-CM

## 2015-05-27 DIAGNOSIS — Z5111 Encounter for antineoplastic chemotherapy: Secondary | ICD-10-CM

## 2015-05-27 DIAGNOSIS — T451X5A Adverse effect of antineoplastic and immunosuppressive drugs, initial encounter: Secondary | ICD-10-CM

## 2015-05-27 DIAGNOSIS — C55 Malignant neoplasm of uterus, part unspecified: Secondary | ICD-10-CM

## 2015-05-27 LAB — COMPREHENSIVE METABOLIC PANEL (CC13)
ALT: 22 U/L (ref 0–55)
AST: 13 U/L (ref 5–34)
Albumin: 3.4 g/dL — ABNORMAL LOW (ref 3.5–5.0)
Alkaline Phosphatase: 124 U/L (ref 40–150)
Anion Gap: 11 mEq/L (ref 3–11)
BILIRUBIN TOTAL: 0.37 mg/dL (ref 0.20–1.20)
BUN: 11.1 mg/dL (ref 7.0–26.0)
CO2: 23 mEq/L (ref 22–29)
CREATININE: 0.7 mg/dL (ref 0.6–1.1)
Calcium: 9.1 mg/dL (ref 8.4–10.4)
Chloride: 103 mEq/L (ref 98–109)
EGFR: 88 mL/min/{1.73_m2} — ABNORMAL LOW (ref >90)
Glucose: 181 mg/dl — ABNORMAL HIGH (ref 70–140)
Potassium: 3.9 mEq/L (ref 3.5–5.1)
Sodium: 137 mEq/L (ref 136–145)
Total Protein: 6.2 g/dL — ABNORMAL LOW (ref 6.4–8.3)

## 2015-05-27 LAB — CBC WITH DIFFERENTIAL/PLATELET
BASO%: 0.4 % (ref 0.0–2.0)
BASOS ABS: 0 10*3/uL (ref 0.0–0.1)
EOS ABS: 0 10*3/uL (ref 0.0–0.5)
EOS%: 0 % (ref 0.0–7.0)
HCT: 29 % — ABNORMAL LOW (ref 34.8–46.6)
HEMOGLOBIN: 9.8 g/dL — AB (ref 11.6–15.9)
LYMPH%: 13.9 % — ABNORMAL LOW (ref 14.0–49.7)
MCH: 34.5 pg — ABNORMAL HIGH (ref 25.1–34.0)
MCHC: 33.7 g/dL (ref 31.5–36.0)
MCV: 102.4 fL — ABNORMAL HIGH (ref 79.5–101.0)
MONO#: 0.1 10*3/uL (ref 0.1–0.9)
MONO%: 1.7 % (ref 0.0–14.0)
NEUT%: 84 % — ABNORMAL HIGH (ref 38.4–76.8)
NEUTROS ABS: 4.3 10*3/uL (ref 1.5–6.5)
Platelets: 175 10*3/uL (ref 145–400)
RBC: 2.83 10*6/uL — ABNORMAL LOW (ref 3.70–5.45)
RDW: 18.7 % — AB (ref 11.2–14.5)
WBC: 5.1 10*3/uL (ref 3.9–10.3)
lymph#: 0.7 10*3/uL — ABNORMAL LOW (ref 0.9–3.3)

## 2015-05-27 MED ORDER — DEXAMETHASONE SODIUM PHOSPHATE 100 MG/10ML IJ SOLN
Freq: Once | INTRAMUSCULAR | Status: AC
  Administered 2015-05-27: 13:00:00 via INTRAVENOUS
  Filled 2015-05-27: qty 8

## 2015-05-27 MED ORDER — FAMOTIDINE IN NACL 20-0.9 MG/50ML-% IV SOLN
INTRAVENOUS | Status: AC
  Filled 2015-05-27: qty 50

## 2015-05-27 MED ORDER — DIPHENHYDRAMINE HCL 50 MG/ML IJ SOLN
INTRAMUSCULAR | Status: AC
  Filled 2015-05-27: qty 1

## 2015-05-27 MED ORDER — SODIUM CHLORIDE 0.9 % IV SOLN
455.2000 mg | Freq: Once | INTRAVENOUS | Status: AC
  Administered 2015-05-27: 460 mg via INTRAVENOUS
  Filled 2015-05-27: qty 46

## 2015-05-27 MED ORDER — FAMOTIDINE IN NACL 20-0.9 MG/50ML-% IV SOLN
20.0000 mg | Freq: Once | INTRAVENOUS | Status: AC
  Administered 2015-05-27: 20 mg via INTRAVENOUS

## 2015-05-27 MED ORDER — SODIUM CHLORIDE 0.9 % IV SOLN
250.0000 mL | Freq: Once | INTRAVENOUS | Status: AC
  Administered 2015-05-27: 250 mL via INTRAVENOUS

## 2015-05-27 MED ORDER — OXYCODONE HCL 5 MG PO TABS: 5.0000 mg | ORAL_TABLET | ORAL | Status: DC | PRN

## 2015-05-27 MED ORDER — DIPHENHYDRAMINE HCL 50 MG/ML IJ SOLN
50.0000 mg | Freq: Once | INTRAMUSCULAR | Status: AC
  Administered 2015-05-27: 50 mg via INTRAVENOUS

## 2015-05-27 NOTE — Patient Instructions (Signed)
[facility] Discharge Instructions for Patients Receiving Chemotherapy  Today you received the following chemotherapy agents Carboplatin  To help prevent nausea and vomiting after your treatment, we encourage you to take your nausea medication as needed   If you develop nausea and vomiting that is not controlled by your nausea medication, call the clinic.   BELOW ARE SYMPTOMS THAT SHOULD BE REPORTED IMMEDIATELY:  *FEVER GREATER THAN 100.5 F  *CHILLS WITH OR WITHOUT FEVER  NAUSEA AND VOMITING THAT IS NOT CONTROLLED WITH YOUR NAUSEA MEDICATION  *UNUSUAL SHORTNESS OF BREATH  *UNUSUAL BRUISING OR BLEEDING  TENDERNESS IN MOUTH AND THROAT WITH OR WITHOUT PRESENCE OF ULCERS  *URINARY PROBLEMS  *BOWEL PROBLEMS  UNUSUAL RASH Items with * indicate a potential emergency and should be followed up as soon as possible.  Feel free to call the clinic you have any questions or concerns. The clinic phone number is [phone].  Please show the CHEMO ALERT CARD at check-in to the Emergency Department and triage nurse.   

## 2015-05-27 NOTE — Telephone Encounter (Signed)
Appointments made and avs printed for patient °

## 2015-05-27 NOTE — Progress Notes (Signed)
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 She is due day 1 cycle 6 dose dense carbo taxol today, however has had significant increase in peripheral neuropathy symptoms in feet such that we will give carboplatin only today.  With worse nausea after day 1 cycle 5, we have added IVF day 2 this cycle; she will have granix x 3 doses beginning 7-8.  She will have CTs prior to seeing Dr Denman George on 07-09-15; she is to see Dr Sondra Come on 07-14-15.  Patient describes peripheral neuropathy in feet as "walking on foam that is wrapped between each of my toes", but only minimal tingling in tips of fingers. She has been more fatigued this week, not SOB at rest, no bleeding, had full dose feraheme in May (no PRBCs). She has been able to eat and drink this week. She had constipation x 1 day after last chemo, ok to increase laxatives day of treatment. She denies other abdominal or pelvic discomfort.   No PAC No genetics testing done  Pre-operative CA 125 1941 on 01-07-15  ONCOLOGIC HISTORY Patient had not had regular medical care since she lost job and medical insurance. She had progressive abdominal swelling over ~ 6 months as well as some postmenopausal bleeding when she was admitted to [facility] in 11-2014 with abdominal pain and presumed peritonitis, initially thought to be either gall bladder related or cirrhosis. Initial paracentesis 12-11-2014 was for 3.5 liters, with malignant cytology consistent with gyn malignancy. Notes refer to CT and MRI "which demonstrated omental thickening, extensive ascites, slight enlargement of the right adnexa and a thickened endometrium (1.1 cm)." She was referred to Dr Josephina Shih, seen on 12-25-14 with endometrial biopsy not possible  due to cervical stenosis. She had second paracentesis for 4.2 liters on 01-04-15. CA 125 on 01-07-15 was 1941 (pre op).She had TAH BSO, omentectomy and radical debulking by Dr Denman George on 01-12-15, with 4 liters of ascites at time of surgery. Findings at surgery were of milial tumor over entire small bowel serosa, mesentery, diaphragm, plaque on bladder and involving cul de sac peritoneum. Surgery was optimal R1 cytoreduction. Pathology [id number]) demonstrated high grade serous carcinoma, IIIC primary peritoneal and at least IB endometrial.. Recommendation was for 6 cycles of carboplatin and taxol, then likely whole pelvic RT + vaginal brachytherapy if she has CR. US paracentesis 01-29-15 for 1.5 liters. She began dose dense carboplatin taxol on 02-04-15.    Review of systems as above, also: Sleeping well with benadryl at hs. Uses 1/2 - 1 oxyIR when generally uncomfortable, tho needs this only occasionally. No LE swelling. Remainder of 10 point Review of Systems negative.  Objective:  Vital signs in last 24 hours:  BP 146/71 mmHg  Pulse 90  Temp(Src) 98.1 F (36.7 C) (Oral)  Resp 18  Ht 5' 7"  (1.702 m)  Wt 167 lb 9.6 oz (76.023 kg)  BMI 26.24 kg/m2  SpO2 98% Weight up 4 lbs. Alert, oriented and appropriate. Ambulatory without assistance Alopecia. Pale, looks chronically ill, not SOB at rest.  HEENT:PERRL, sclerae not icteric. Oral mucosa moist without lesions, posterior pharynx clear.  Neck supple. No JVD.  Lymphatics:no cervical,supraclavicular adenopathy Resp: clear to auscultation bilaterally and no dullness to percussion bilaterally Cardio: regular rate and rhythm. No gallop. GI: soft, nontender, not distended, no  mass or organomegaly. Normally active bowel sounds. Surgical incision not remarkable. Musculoskeletal/ Extremities: without pitting edema, cords, tenderness Neuro:  peripheral neuropathy feet > hands. Otherwise nonfocal. PSYCH appropriate mood and affect Skin without rash,  ecchymosis, petechiae   Lab Results:  Results for orders placed or performed in visit on 05/27/15  CBC with Differential  Result Value Ref Range   WBC 5.1 3.9 - 10.3 10e3/uL   NEUT# 4.3 1.5 - 6.5 10e3/uL   HGB 9.8 (L) 11.6 - 15.9 g/dL   HCT 29.0 (L) 34.8 - 46.6 %   Platelets 175 145 - 400 10e3/uL   MCV 102.4 (H) 79.5 - 101.0 fL   MCH 34.5 (H) 25.1 - 34.0 pg   MCHC 33.7 31.5 - 36.0 g/dL   RBC 2.83 (L) 3.70 - 5.45 10e6/uL   RDW 18.7 (H) 11.2 - 14.5 %   lymph# 0.7 (L) 0.9 - 3.3 10e3/uL   MONO# 0.1 0.1 - 0.9 10e3/uL   Eosinophils Absolute 0.0 0.0 - 0.5 10e3/uL   Basophils Absolute 0.0 0.0 - 0.1 10e3/uL   NEUT% 84.0 (H) 38.4 - 76.8 %   LYMPH% 13.9 (L) 14.0 - 49.7 %   MONO% 1.7 0.0 - 14.0 %   EOS% 0.0 0.0 - 7.0 %   BASO% 0.4 0.0 - 2.0 %  Comprehensive metabolic panel (Cmet) - [facility]  Result Value Ref Range   Sodium 137 136 - 145 mEq/L   Potassium 3.9 3.5 - 5.1 mEq/L   Chloride 103 98 - 109 mEq/L   CO2 23 22 - 29 mEq/L   Glucose 181 (H) 70 - 140 mg/dl   BUN 11.1 7.0 - 26.0 mg/dL   Creatinine 0.7 0.6 - 1.1 mg/dL   Total Bilirubin 0.37 0.20 - 1.20 mg/dL   Alkaline Phosphatase 124 40 - 150 U/L   AST 13 5 - 34 U/L   ALT 22 0 - 55 U/L   Total Protein 6.2 (L) 6.4 - 8.3 g/dL   Albumin 3.4 (L) 3.5 - 5.0 g/dL   Calcium 9.1 8.4 - 10.4 mg/dL   Anion Gap 11 3 - 11 mEq/L   EGFR 88 (L) >90 ml/min/1.73 m2    CA 125 available after visit 173, this having been 136 on 04-29-15.   Studies/Results:  No results found.  Medications: I have reviewed the patient's current medications. Taxol held with day 1 cycle 6 chemo today  DISCUSSION: taxol related peripheral neuropathy as above. I will see her prior to treatment on 06-03-15. With increased neuropathy and CA 125 marker not improved in last 4 weeks, may need to re-evaluate with scans sooner.  She understands that we can give PRBCs if more symptomatic from the anemia.  Will need PAC if chemo continues beyond these 6  cycles.  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 More peripheral neuropathy symptoms now, hold taxol today. May need to adjust for last two treatments, and I will see her before treatment next week. Will need CT CAP after chemo, reevaluation by gyn onc and possible whole pelvic radiation + vaginal brachytherapy. 2.iron deficiency anemia + chemo anemia: post feraheme, hgb some better today. 3.constipation multifactorial: better with miralax to bid. No oral iron 4. Increased nausea and fatigue after day 1 cycle 5.  GIve extra IVF tomorrow. 5.GERD now on protonix 6.long tobacco  DCd fall 2015 7.post partial thyroidectomy for goiter ~ 1980, on medication subsequently until ~ 1 year ago also related to lack of insurance then. Dr Philip Aspen managing 8.dental issues, still has not gotten to dentist 9.Has been given information on advanced directives 10. Difficult IV access. Will need PAC if additional chemo  All questions answered, chemo orders changed and pharmacy/ infusion aware. Time spent 25 min including >50% counseling and coordination of care.   LIVESAY,LENNIS P, MD   05/27/2015, 1:58 PM

## 2015-05-28 ENCOUNTER — Ambulatory Visit: Payer: 59

## 2015-05-28 ENCOUNTER — Ambulatory Visit (HOSPITAL_BASED_OUTPATIENT_CLINIC_OR_DEPARTMENT_OTHER): Payer: 59

## 2015-05-28 VITALS — BP 130/78 | HR 73 | Temp 96.0°F | Resp 18

## 2015-05-28 DIAGNOSIS — C482 Malignant neoplasm of peritoneum, unspecified: Secondary | ICD-10-CM

## 2015-05-28 DIAGNOSIS — Z5189 Encounter for other specified aftercare: Secondary | ICD-10-CM | POA: Diagnosis not present

## 2015-05-28 DIAGNOSIS — T451X5A Adverse effect of antineoplastic and immunosuppressive drugs, initial encounter: Secondary | ICD-10-CM

## 2015-05-28 DIAGNOSIS — C541 Malignant neoplasm of endometrium: Secondary | ICD-10-CM

## 2015-05-28 DIAGNOSIS — G620 Drug-induced polyneuropathy: Secondary | ICD-10-CM | POA: Insufficient documentation

## 2015-05-28 LAB — CA 125: CA 125: 173 U/mL — ABNORMAL HIGH (ref <35)

## 2015-05-28 MED ORDER — SODIUM CHLORIDE 0.9 % IV SOLN
INTRAVENOUS | Status: DC
  Administered 2015-05-28: 14:00:00 via INTRAVENOUS

## 2015-05-28 MED ORDER — TBO-FILGRASTIM 300 MCG/0.5ML ~~LOC~~ SOSY
300.0000 ug | PREFILLED_SYRINGE | Freq: Once | SUBCUTANEOUS | Status: AC
  Administered 2015-05-28: 300 ug via SUBCUTANEOUS
  Filled 2015-05-28: qty 0.5

## 2015-05-28 NOTE — Progress Notes (Signed)
Granix injection given by infusion nurse.

## 2015-05-28 NOTE — Patient Instructions (Signed)
Dehydration, Adult Dehydration is when you lose more fluids from the body than you take in. Vital organs like the kidneys, brain, and heart cannot function without a proper amount of fluids and salt. Any loss of fluids from the body can cause dehydration.  CAUSES   Vomiting.  Diarrhea.  Excessive sweating.  Excessive urine output.  Fever. SYMPTOMS  Mild dehydration  Thirst.  Dry lips.  Slightly dry mouth. Moderate dehydration  Very dry mouth.  Sunken eyes.  Skin does not bounce back quickly when lightly pinched and released.  Dark urine and decreased urine production.  Decreased tear production.  Headache. Severe dehydration  Very dry mouth.  Extreme thirst.  Rapid, weak pulse (more than 100 beats per minute at rest).  Cold hands and feet.  Not able to sweat in spite of heat and temperature.  Rapid breathing.  Blue lips.  Confusion and lethargy.  Difficulty being awakened.  Minimal urine production.  No tears. DIAGNOSIS  Your caregiver will diagnose dehydration based on your symptoms and your exam. Blood and urine tests will help confirm the diagnosis. The diagnostic evaluation should also identify the cause of dehydration. TREATMENT  Treatment of mild or moderate dehydration can often be done at home by increasing the amount of fluids that you drink. It is best to drink small amounts of fluid more often. Drinking too much at one time can make vomiting worse. Refer to the home care instructions below. Severe dehydration needs to be treated at the hospital where you will probably be given intravenous (IV) fluids that contain water and electrolytes. HOME CARE INSTRUCTIONS   Ask your caregiver about specific rehydration instructions.  Drink enough fluids to keep your urine clear or pale yellow.  Drink small amounts frequently if you have nausea and vomiting.  Eat as you normally do.  Avoid:  Foods or drinks high in sugar.  Carbonated  drinks.  Juice.  Extremely hot or cold fluids.  Drinks with caffeine.  Fatty, greasy foods.  Alcohol.  Tobacco.  Overeating.  Gelatin desserts.  Wash your hands well to avoid spreading bacteria and viruses.  Only take over-the-counter or prescription medicines for pain, discomfort, or fever as directed by your caregiver.  Ask your caregiver if you should continue all prescribed and over-the-counter medicines.  Keep all follow-up appointments with your caregiver. SEEK MEDICAL CARE IF:  You have abdominal pain and it increases or stays in one area (localizes).  You have a rash, stiff neck, or severe headache.  You are irritable, sleepy, or difficult to awaken.  You are weak, dizzy, or extremely thirsty. SEEK IMMEDIATE MEDICAL CARE IF:   You are unable to keep fluids down or you get worse despite treatment.  You have frequent episodes of vomiting or diarrhea.  You have blood or green matter (bile) in your vomit.  You have blood in your stool or your stool looks black and tarry.  You have not urinated in 6 to 8 hours, or you have only urinated a small amount of very dark urine.  You have a fever.  You faint. MAKE SURE YOU:   Understand these instructions.  Will watch your condition.  Will get help right away if you are not doing well or get worse. Document Released: 11/06/2005 Document Revised: 01/29/2012 Document Reviewed: 06/26/2011 Exit[employer] Patient Information 2015 [employer]. This information is not intended to replace advice given to you by your health care provider. Make sure you discuss any questions you have with your health care   provider.  

## 2015-05-29 ENCOUNTER — Ambulatory Visit (HOSPITAL_BASED_OUTPATIENT_CLINIC_OR_DEPARTMENT_OTHER): Payer: 59

## 2015-05-29 VITALS — BP 143/52 | HR 79 | Temp 97.1°F | Resp 18

## 2015-05-29 DIAGNOSIS — C482 Malignant neoplasm of peritoneum, unspecified: Secondary | ICD-10-CM | POA: Diagnosis not present

## 2015-05-29 DIAGNOSIS — Z5189 Encounter for other specified aftercare: Secondary | ICD-10-CM | POA: Diagnosis not present

## 2015-05-29 DIAGNOSIS — C541 Malignant neoplasm of endometrium: Secondary | ICD-10-CM | POA: Diagnosis not present

## 2015-05-29 MED ORDER — TBO-FILGRASTIM 300 MCG/0.5ML ~~LOC~~ SOSY
300.0000 ug | PREFILLED_SYRINGE | Freq: Once | SUBCUTANEOUS | Status: AC
  Administered 2015-05-29: 300 ug via SUBCUTANEOUS

## 2015-05-29 NOTE — Patient Instructions (Signed)

## 2015-05-31 ENCOUNTER — Ambulatory Visit (HOSPITAL_BASED_OUTPATIENT_CLINIC_OR_DEPARTMENT_OTHER): Payer: 59

## 2015-05-31 VITALS — BP 119/77 | HR 96 | Temp 98.3°F

## 2015-05-31 DIAGNOSIS — C482 Malignant neoplasm of peritoneum, unspecified: Secondary | ICD-10-CM

## 2015-05-31 DIAGNOSIS — C541 Malignant neoplasm of endometrium: Secondary | ICD-10-CM | POA: Diagnosis not present

## 2015-05-31 DIAGNOSIS — Z5189 Encounter for other specified aftercare: Secondary | ICD-10-CM | POA: Diagnosis not present

## 2015-05-31 MED ORDER — TBO-FILGRASTIM 300 MCG/0.5ML ~~LOC~~ SOSY
300.0000 ug | PREFILLED_SYRINGE | Freq: Once | SUBCUTANEOUS | Status: AC
  Administered 2015-05-31: 300 ug via SUBCUTANEOUS
  Filled 2015-05-31: qty 0.5

## 2015-06-02 ENCOUNTER — Other Ambulatory Visit: Payer: Self-pay | Admitting: Oncology

## 2015-06-03 ENCOUNTER — Telehealth: Payer: Self-pay | Admitting: Oncology

## 2015-06-03 ENCOUNTER — Ambulatory Visit: Payer: 59 | Admitting: Radiation Oncology

## 2015-06-03 ENCOUNTER — Ambulatory Visit: Payer: 59

## 2015-06-03 ENCOUNTER — Encounter: Payer: Self-pay | Admitting: Oncology

## 2015-06-03 ENCOUNTER — Ambulatory Visit (HOSPITAL_BASED_OUTPATIENT_CLINIC_OR_DEPARTMENT_OTHER): Payer: 59 | Admitting: Oncology

## 2015-06-03 ENCOUNTER — Other Ambulatory Visit (HOSPITAL_BASED_OUTPATIENT_CLINIC_OR_DEPARTMENT_OTHER): Payer: 59

## 2015-06-03 VITALS — BP 166/86 | HR 84 | Temp 97.6°F | Resp 18 | Ht 67.0 in | Wt 167.4 lb

## 2015-06-03 DIAGNOSIS — D6481 Anemia due to antineoplastic chemotherapy: Secondary | ICD-10-CM | POA: Diagnosis not present

## 2015-06-03 DIAGNOSIS — D509 Iron deficiency anemia, unspecified: Secondary | ICD-10-CM

## 2015-06-03 DIAGNOSIS — C55 Malignant neoplasm of uterus, part unspecified: Secondary | ICD-10-CM

## 2015-06-03 DIAGNOSIS — R110 Nausea: Secondary | ICD-10-CM

## 2015-06-03 DIAGNOSIS — C482 Malignant neoplasm of peritoneum, unspecified: Secondary | ICD-10-CM | POA: Diagnosis not present

## 2015-06-03 DIAGNOSIS — G622 Polyneuropathy due to other toxic agents: Secondary | ICD-10-CM

## 2015-06-03 DIAGNOSIS — K0381 Cracked tooth: Secondary | ICD-10-CM

## 2015-06-03 DIAGNOSIS — D701 Agranulocytosis secondary to cancer chemotherapy: Secondary | ICD-10-CM

## 2015-06-03 DIAGNOSIS — T451X5A Adverse effect of antineoplastic and immunosuppressive drugs, initial encounter: Secondary | ICD-10-CM

## 2015-06-03 DIAGNOSIS — C541 Malignant neoplasm of endometrium: Secondary | ICD-10-CM | POA: Diagnosis not present

## 2015-06-03 DIAGNOSIS — K219 Gastro-esophageal reflux disease without esophagitis: Secondary | ICD-10-CM

## 2015-06-03 DIAGNOSIS — G620 Drug-induced polyneuropathy: Secondary | ICD-10-CM

## 2015-06-03 LAB — COMPREHENSIVE METABOLIC PANEL (CC13)
ALBUMIN: 3.5 g/dL (ref 3.5–5.0)
ALK PHOS: 124 U/L (ref 40–150)
ALT: 13 U/L (ref 0–55)
AST: 14 U/L (ref 5–34)
Anion Gap: 12 mEq/L — ABNORMAL HIGH (ref 3–11)
BUN: 12.5 mg/dL (ref 7.0–26.0)
CO2: 23 mEq/L (ref 22–29)
Calcium: 9.1 mg/dL (ref 8.4–10.4)
Chloride: 103 mEq/L (ref 98–109)
Creatinine: 0.8 mg/dL (ref 0.6–1.1)
EGFR: 83 mL/min/{1.73_m2} — ABNORMAL LOW (ref >90)
Glucose: 132 mg/dl (ref 70–140)
Potassium: 3.9 mEq/L (ref 3.5–5.1)
Sodium: 138 mEq/L (ref 136–145)
Total Bilirubin: 0.52 mg/dL (ref 0.20–1.20)
Total Protein: 6.5 g/dL (ref 6.4–8.3)

## 2015-06-03 LAB — CBC WITH DIFFERENTIAL/PLATELET
BASO%: 0 % (ref 0.0–2.0)
BASOS ABS: 0 10*3/uL (ref 0.0–0.1)
EOS ABS: 0 10*3/uL (ref 0.0–0.5)
EOS%: 0 % (ref 0.0–7.0)
HEMATOCRIT: 29.7 % — AB (ref 34.8–46.6)
HGB: 10.3 g/dL — ABNORMAL LOW (ref 11.6–15.9)
LYMPH#: 0.6 10*3/uL — AB (ref 0.9–3.3)
LYMPH%: 24.1 % (ref 14.0–49.7)
MCH: 34.9 pg — AB (ref 25.1–34.0)
MCHC: 34.7 g/dL (ref 31.5–36.0)
MCV: 100.7 fL (ref 79.5–101.0)
MONO#: 0.1 10*3/uL (ref 0.1–0.9)
MONO%: 3.5 % (ref 0.0–14.0)
NEUT#: 1.7 10*3/uL (ref 1.5–6.5)
NEUT%: 72.4 % (ref 38.4–76.8)
PLATELETS: 158 10*3/uL (ref 145–400)
RBC: 2.95 10*6/uL — AB (ref 3.70–5.45)
RDW: 16.9 % — AB (ref 11.2–14.5)
WBC: 2.3 10*3/uL — AB (ref 3.9–10.3)

## 2015-06-03 MED ORDER — TBO-FILGRASTIM 300 MCG/0.5ML ~~LOC~~ SOSY
300.0000 ug | PREFILLED_SYRINGE | Freq: Once | SUBCUTANEOUS | Status: AC
  Administered 2015-06-03: 300 ug via SUBCUTANEOUS
  Filled 2015-06-03: qty 0.5

## 2015-06-03 NOTE — Progress Notes (Signed)
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 At surgery 01-12-15, she had milial disease over diaphragm, small bowel, mesentery, pelvic peritoneum and 4 liters of ascites in addition to the other involvement. She has had more difficulty tolerating present dose dense carboplatin and taxol, with cytopenias and progressive peripheral neuropathy. Day 1 cycle 6 was carboplatin only on 05-27-15, taxol held for first time thru the treatment course due to extent of peripheral neuropathy in feet.  She is to have restaging CTs prior to seeing Dr Denman George in August.   Patient is fatigued and has persistent neuropathy feet and toes bilaterally which is most noticeable when she is still and when she tries to sleep; she has some numbness in fingers not as bothersome. She had nausea for several days without vomiting. Bowels have moved. She is not SOB at rest. She has had no bleeding. She denies abdominal or pelvic pain. Peripheral IV access is not easy, including blood draws in [facility] lab.  No PRBC transfusions. Feraheme given 03-2015 total 1020 mg No PAC. I have told her that, if she needs additional chemotherapy, she should have PAC. No genetics testing done  Pre-operative CA 125 1941 on 01-07-15  ONCOLOGIC HISTORY Patient had not had regular medical care since she lost job and medical insurance. She had progressive abdominal swelling over ~ 6 months as well as some postmenopausal bleeding when she was admitted to [facility] in 11-2014 with abdominal pain and presumed peritonitis, initially thought to be either gall bladder related or cirrhosis. Initial paracentesis 12-11-2014 was for 3.5 liters, with malignant cytology  consistent with gyn malignancy. Notes refer to CT and MRI "which demonstrated omental thickening, extensive ascites, slight enlargement of the right adnexa and a thickened endometrium (1.1 cm)." She was referred to Dr Josephina Shih, seen on 12-25-14 with endometrial biopsy not possible due to cervical stenosis. She had second paracentesis for 4.2 liters on 01-04-15. CA 125 on 01-07-15 was 1941 (pre op).She had TAH BSO, omentectomy and radical debulking by Dr Denman George on 01-12-15, with 4 liters of ascites at time of surgery. Findings at surgery were of milial tumor over entire small bowel serosa, mesentery, diaphragm, plaque on bladder and involving cul de sac peritoneum. Surgery was optimal R1 cytoreduction. Pathology [id number]) demonstrated high grade serous carcinoma, IIIC primary peritoneal and at least IB endometrial.. Recommendation was for 6 cycles of carboplatin and taxol, then likely whole pelvic RT + vaginal brachytherapy if she has CR. US paracentesis 01-29-15 for 1.5 liters. She began dose dense carboplatin taxol on 02-04-15.    Review of systems as above, also: No fever or symptoms of infection. No chest pain. No LE swelling. Bladder ok Remainder of 10 point Review of Systems negative.  Objective:  Vital signs in last 24 hours:  BP 166/86 mmHg  Pulse 84  Temp(Src) 97.6 F (36.4 C) (Oral)  Resp 18  Ht 5' 7"  (1.702 m)  Wt 167 lb 6.4 oz (75.932 kg)  BMI 26.21 kg/m2  SpO2 99% Weight stable. Looks chronically ill, needs some assistance on and off exam table.  Alert, oriented and appropriate, very pleasant as always. Ambulatory without assistance.  Complete alopecia  HEENT:PERRL, sclerae not icteric. Oral mucosa moist without lesions, posterior pharynx clear.  Neck  supple. No JVD.  Lymphatics:no cervical,supraclavicular or inguinal adenopathy Resp: clear to auscultation bilaterally and normal percussion bilaterally Cardio: regular rate and rhythm. No gallop. GI: soft, nontender, not  distended, no mass or organomegaly. A few bowel sounds. Surgical incision not remarkable. Musculoskeletal/ Extremities: without pitting edema, cords, tenderness Neuro: peripheral neuropathy feet bilaterally and distal fingers. Otherwise nonfocal. Psych appropriate mood and affect Skin without rash, petechiae. Bruising dorsum of hands without cords or erythema   Lab Results:  Results for orders placed or performed in visit on 06/03/15  CBC with Differential  Result Value Ref Range   WBC 2.3 (L) 3.9 - 10.3 10e3/uL   NEUT# 1.7 1.5 - 6.5 10e3/uL   HGB 10.3 (L) 11.6 - 15.9 g/dL   HCT 29.7 (L) 34.8 - 46.6 %   Platelets 158 145 - 400 10e3/uL   MCV 100.7 79.5 - 101.0 fL   MCH 34.9 (H) 25.1 - 34.0 pg   MCHC 34.7 31.5 - 36.0 g/dL   RBC 2.95 (L) 3.70 - 5.45 10e6/uL   RDW 16.9 (H) 11.2 - 14.5 %   lymph# 0.6 (L) 0.9 - 3.3 10e3/uL   MONO# 0.1 0.1 - 0.9 10e3/uL   Eosinophils Absolute 0.0 0.0 - 0.5 10e3/uL   Basophils Absolute 0.0 0.0 - 0.1 10e3/uL   NEUT% 72.4 38.4 - 76.8 %   LYMPH% 24.1 14.0 - 49.7 %   MONO% 3.5 0.0 - 14.0 %   EOS% 0.0 0.0 - 7.0 %   BASO% 0.0 0.0 - 2.0 %  Comprehensive metabolic panel (Cmet) - [facility]  Result Value Ref Range   Sodium 138 136 - 145 mEq/L   Potassium 3.9 3.5 - 5.1 mEq/L   Chloride 103 98 - 109 mEq/L   CO2 23 22 - 29 mEq/L   Glucose 132 70 - 140 mg/dl   BUN 12.5 7.0 - 26.0 mg/dL   Creatinine 0.8 0.6 - 1.1 mg/dL   Total Bilirubin 0.52 0.20 - 1.20 mg/dL   Alkaline Phosphatase 124 40 - 150 U/L   AST 14 5 - 34 U/L   ALT 13 0 - 55 U/L   Total Protein 6.5 6.4 - 8.3 g/dL   Albumin 3.5 3.5 - 5.0 g/dL   Calcium 9.1 8.4 - 10.4 mg/dL   Anion Gap 12 (H) 3 - 11 mEq/L   EGFR 83 (L) >90 ml/min/1.73 m2    CA 125 on 05-27-15 was 173, this having been 136 on 04-29-15 and 519 on 03-25-15.  Studies/Results:  No results found. I spoke directly with central radiology scheduling as scans are not yet set up in EMR.   Medications: I have reviewed the patient's current  medications. In addition to 3 doses of granix in past week, she will be given #4 granix today.  DISCUSSION: peripheral neuropathy and performance status now are more concerning, and I have recommended that we hold day 8 and day 15 taxol for present cycle 6 (only carboplatin on day 1 cycle 6). Granix as above, and I will see her with labs next week. Hopefully symptoms and fatigue/ general decrease in PS will improve with short break off of chemo. She understands that she may need additional treatment after restaging scans, but even if so she needs some time to recover from the treatments now.   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 She has had difficult time with chemo, including more peripheral neuropathy symptoms such that taxol will be held for rest of present cycle 6. For restaging CTs and reevaluation by gyn onc,  possible whole pelvic radiation + vaginal brachytherapy depending on response to treatment on this restaging.  2.iron deficiency anemia + chemo anemia: post feraheme, hgb some better today. 3.constipation multifactorial: better with miralax to bid. No oral iron 4. Chemo related peripheral neuropathy as above 5.GERD begin protonix 6.long tobacco DCd fall 2015 7.post partial thyroidectomy for goiter ~ 1980, on medication subsequently until ~ 1 year ago also related to lack of insurance then. Dr Philip Aspen managing 8.broken lower tooth, less uncomfortable: trying to schedule with dentist, need to be careful with counts if invasive procedure. 9.Has information on advanced directives 10. Difficult IV access. She will do best with PAC if needs further chemo (tho does not want this)    All questions answered. Patient knows to call if concerns  prior to follow up visit next week. TIme spent 25 min including >50% counseling and coordination of care. CC Dr Candace Gallus, MD   06/03/2015, 1:58 PM

## 2015-06-03 NOTE — Telephone Encounter (Signed)
Gave avs & calendar for July/August. °

## 2015-06-04 ENCOUNTER — Ambulatory Visit: Payer: 59

## 2015-06-05 ENCOUNTER — Ambulatory Visit: Payer: 59

## 2015-06-07 ENCOUNTER — Ambulatory Visit: Payer: 59

## 2015-06-09 ENCOUNTER — Other Ambulatory Visit: Payer: Self-pay | Admitting: Oncology

## 2015-06-09 DIAGNOSIS — C482 Malignant neoplasm of peritoneum, unspecified: Secondary | ICD-10-CM

## 2015-06-10 ENCOUNTER — Telehealth: Payer: Self-pay | Admitting: Oncology

## 2015-06-10 ENCOUNTER — Ambulatory Visit: Payer: 59

## 2015-06-10 ENCOUNTER — Encounter: Payer: Self-pay | Admitting: Oncology

## 2015-06-10 ENCOUNTER — Other Ambulatory Visit (HOSPITAL_BASED_OUTPATIENT_CLINIC_OR_DEPARTMENT_OTHER): Payer: 59

## 2015-06-10 ENCOUNTER — Ambulatory Visit (HOSPITAL_BASED_OUTPATIENT_CLINIC_OR_DEPARTMENT_OTHER): Payer: 59 | Admitting: Oncology

## 2015-06-10 VITALS — BP 136/71 | HR 84 | Temp 98.1°F | Resp 18 | Ht 67.0 in | Wt 171.6 lb

## 2015-06-10 DIAGNOSIS — R110 Nausea: Secondary | ICD-10-CM

## 2015-06-10 DIAGNOSIS — C541 Malignant neoplasm of endometrium: Secondary | ICD-10-CM

## 2015-06-10 DIAGNOSIS — D509 Iron deficiency anemia, unspecified: Secondary | ICD-10-CM

## 2015-06-10 DIAGNOSIS — G622 Polyneuropathy due to other toxic agents: Secondary | ICD-10-CM

## 2015-06-10 DIAGNOSIS — C482 Malignant neoplasm of peritoneum, unspecified: Secondary | ICD-10-CM

## 2015-06-10 DIAGNOSIS — D6481 Anemia due to antineoplastic chemotherapy: Secondary | ICD-10-CM | POA: Diagnosis not present

## 2015-06-10 DIAGNOSIS — K5909 Other constipation: Secondary | ICD-10-CM

## 2015-06-10 DIAGNOSIS — C55 Malignant neoplasm of uterus, part unspecified: Secondary | ICD-10-CM

## 2015-06-10 DIAGNOSIS — G620 Drug-induced polyneuropathy: Secondary | ICD-10-CM

## 2015-06-10 DIAGNOSIS — K219 Gastro-esophageal reflux disease without esophagitis: Secondary | ICD-10-CM

## 2015-06-10 DIAGNOSIS — K0381 Cracked tooth: Secondary | ICD-10-CM

## 2015-06-10 DIAGNOSIS — T451X5A Adverse effect of antineoplastic and immunosuppressive drugs, initial encounter: Secondary | ICD-10-CM

## 2015-06-10 LAB — CBC WITH DIFFERENTIAL/PLATELET
BASO%: 0.2 % (ref 0.0–2.0)
Basophils Absolute: 0 10*3/uL (ref 0.0–0.1)
EOS%: 1.1 % (ref 0.0–7.0)
Eosinophils Absolute: 0.1 10*3/uL (ref 0.0–0.5)
HCT: 30.8 % — ABNORMAL LOW (ref 34.8–46.6)
HGB: 10.3 g/dL — ABNORMAL LOW (ref 11.6–15.9)
LYMPH%: 38.2 % (ref 14.0–49.7)
MCH: 35 pg — ABNORMAL HIGH (ref 25.1–34.0)
MCHC: 33.4 g/dL (ref 31.5–36.0)
MCV: 104.8 fL — AB (ref 79.5–101.0)
MONO#: 0.8 10*3/uL (ref 0.1–0.9)
MONO%: 17.1 % — ABNORMAL HIGH (ref 0.0–14.0)
NEUT%: 43.4 % (ref 38.4–76.8)
NEUTROS ABS: 1.9 10*3/uL (ref 1.5–6.5)
PLATELETS: 205 10*3/uL (ref 145–400)
RBC: 2.94 10*6/uL — ABNORMAL LOW (ref 3.70–5.45)
RDW: 17.5 % — ABNORMAL HIGH (ref 11.2–14.5)
WBC: 4.5 10*3/uL (ref 3.9–10.3)
lymph#: 1.7 10*3/uL (ref 0.9–3.3)

## 2015-06-10 NOTE — Telephone Encounter (Signed)
Gave asv & calendar for August

## 2015-06-10 NOTE — Progress Notes (Signed)
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    Patient has had dose dense carboplatin taxol from 02-04-15 thru day 1 cycle 6 on 05-27-15, with taxol held cycle 6 due to peripheral neuropathy; she has not had an easy time with chemo overall, tho she is very motivated.  Plan is for restaging CT in early August prior to follow up with Dr Denman George. CA 125 has improved but not normalized, and patient understands that she may need additional treatment.  She also has new patient consultation scheduled with Dr Sondra Come for 07-14-15 if appropriate to keep this after restaging information available.  Patient has actually felt some better overall in past few days. Appetite is adequate, peripheral neuropathy no worse in feet tho unable to tolerate toenails trimmed, bowels ok, no increased SOB.She denies abdominal or pelvic pain and has had no bleeding.     No PAC No genetics testing done  Pre-operative CA 125 1941 on 01-07-15  ONCOLOGIC HISTORY Patient had not had regular medical care since she lost job and medical insurance. She had progressive abdominal swelling over ~ 6 months as well as some postmenopausal bleeding when she was admitted to [facility] in 11-2014 with abdominal pain and presumed peritonitis, initially thought to be either gall bladder related or cirrhosis. Initial paracentesis 12-11-2014 was for 3.5 liters, with malignant cytology consistent with gyn malignancy. Notes refer to CT and MRI "which demonstrated omental thickening, extensive ascites, slight enlargement of the right adnexa and a thickened endometrium (1.1 cm)." She was referred to Dr Josephina Shih, seen on 12-25-14 with endometrial biopsy not possible due to cervical stenosis. She had  second paracentesis for 4.2 liters on 01-04-15. CA 125 on 01-07-15 was 1941 (pre op).She had TAH BSO, omentectomy and radical debulking by Dr Denman George on 01-12-15, with 4 liters of ascites at time of surgery. Findings at surgery were of milial tumor over entire small bowel serosa, mesentery, diaphragm, plaque on bladder and involving cul de sac peritoneum. Surgery was optimal R1 cytoreduction. Pathology [id number]) demonstrated high grade serous carcinoma, IIIC primary peritoneal and at least IB endometrial.. Recommendation was for 6 cycles of carboplatin and taxol, then likely whole pelvic RT + vaginal brachytherapy if she has CR. US paracentesis 01-29-15 for 1.5 liters. She began dose dense carboplatin taxol on 02-04-15.    Review of systems as above, also:  Bruising at sites of attempted IV access. No fever or symptoms of infection Remainder of 10 point Review of Systems negative.  Objective:  Vital signs in last 24 hours:  BP 136/71 mmHg  Pulse 84  Temp(Src) 98.1 F (36.7 C) (Oral)  Resp 18  Ht 5\' 7"  (1.702 m)  Wt 171 lb 9.6 oz (77.837 kg)  BMI 26.87 kg/m2  SpO2 98% Weight up 4 lbs Alert, oriented and appropriate. Ambulatory without assistance. Looks more comfortable and a little more energetic today. Respirations not labored RA, able to get on and off exam table with some assistance  Alopecia  HEENT:PERRL, sclerae not icteric. Oral mucosa moist without lesions, posterior pharynx clear.  Neck supple. No JVD.  Lymphatics:no cervical,supraclavicular or inguinal adenopathy Resp: somewhat diminished BS thruout otherwise clear to auscultation bilaterally and no dullness to percussion bilaterally Cardio: regular rate and rhythm. No gallop. GI: soft, nontender, not distended, no mass  or organomegaly, no apparent ascites. Normally active bowel sounds. Surgical incision not remarkable. Musculoskeletal/ Extremities: without pitting edema, cords, tenderness Neuro: peripheral neuropathy feet as  noted, and tips of fingers. Otherwise nonfocal. PSYCH appropriate mood and affect Skin without rash, ecchymosis, petechiae  Lab Results:  Results for orders placed or performed in visit on 06/10/15  CBC with Differential  Result Value Ref Range   WBC 4.5 3.9 - 10.3 10e3/uL   NEUT# 1.9 1.5 - 6.5 10e3/uL   HGB 10.3 (L) 11.6 - 15.9 g/dL   HCT 30.8 (L) 34.8 - 46.6 %   Platelets 205 145 - 400 10e3/uL   MCV 104.8 (H) 79.5 - 101.0 fL   MCH 35.0 (H) 25.1 - 34.0 pg   MCHC 33.4 31.5 - 36.0 g/dL   RBC 2.94 (L) 3.70 - 5.45 10e6/uL   RDW 17.5 (H) 11.2 - 14.5 %   lymph# 1.7 0.9 - 3.3 10e3/uL   MONO# 0.8 0.1 - 0.9 10e3/uL   Eosinophils Absolute 0.1 0.0 - 0.5 10e3/uL   Basophils Absolute 0.0 0.0 - 0.1 10e3/uL   NEUT% 43.4 38.4 - 76.8 %   LYMPH% 38.2 14.0 - 49.7 %   MONO% 17.1 (H) 0.0 - 14.0 %   EOS% 1.1 0.0 - 7.0 %   BASO% 0.2 0.0 - 2.0 %    CA 125 on 05-27-15  173, having been 136 on 04-29-15 and 519 on 03-25-15  Studies/Results:  No results found.  CT CAP requested for 06-28-15  Medications: I have reviewed the patient's current medications.  DISCUSSION:  Patient aware of CA 125 still elevated and that she may well need additional treatment, however should do better with this if she has a short break to recover somewhat from recent chemotherapy. I have told her that she needs PAC if further chemo. Will repeat CA 125 day of CT so that this information is also available when she sees Dr Denman George. She has new patient consultation scheduled with Dr Sondra Come for 07-14-15 if appropirate after the restaging.  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 CA 125 improved but not to normal after dose dense carbo taxol from 02-04-15 thru day 1 cycle 6 carbo only on 05-27-15. CT CAP  upcoming, reevaluation by gyn onc and possible whole pelvic radiation + vaginal brachytherapy if good response.  2.iron deficiency anemia + chemo anemia: post feraheme, hgb stable. 3.peripheral neuropathy: primarily in feet, taxol held cycle 6 4. Increased nausea and fatigue thru chemo as above, already some better with short break 5.GERD now on protonix 6.long tobacco DCd fall 2015 7.post partial thyroidectomy for goiter ~ 1980, on medication subsequently until ~ 1 year ago also related to lack of insurance then. Dr Philip Aspen managing 8.broken tooth: I have asked her to set up appointment now with her dentist, in hopes of addressing this prior to further treatment. She will let us know if she cannot get that appointment in timely fashion 9.Has been given information on advanced directives 10. Difficult IV access. Will need PAC if additional chemo 11.constipation multifactorial: better with miralax to bid. Did not tolerate oral iron  All questions answered. Patient seems to understand discussion and is in agreement with recommendations and plans above. TIme spent 25 min including >50% counseling and coordination of care. CC Dr Philip Aspen   LIVESAY,LENNIS P,  MD   06/10/2015, 9:49 AM

## 2015-06-11 ENCOUNTER — Ambulatory Visit: Payer: 59

## 2015-06-12 ENCOUNTER — Ambulatory Visit: Payer: 59

## 2015-06-14 ENCOUNTER — Ambulatory Visit: Payer: 59

## 2015-06-15 ENCOUNTER — Other Ambulatory Visit: Payer: 59

## 2015-06-15 ENCOUNTER — Ambulatory Visit: Payer: 59 | Admitting: Oncology

## 2015-06-25 ENCOUNTER — Encounter: Payer: Self-pay | Admitting: Nurse Practitioner

## 2015-06-25 ENCOUNTER — Telehealth: Payer: Self-pay | Admitting: *Deleted

## 2015-06-25 ENCOUNTER — Ambulatory Visit (HOSPITAL_BASED_OUTPATIENT_CLINIC_OR_DEPARTMENT_OTHER): Payer: 59 | Admitting: Nurse Practitioner

## 2015-06-25 ENCOUNTER — Other Ambulatory Visit (HOSPITAL_BASED_OUTPATIENT_CLINIC_OR_DEPARTMENT_OTHER): Payer: 59

## 2015-06-25 ENCOUNTER — Ambulatory Visit (HOSPITAL_COMMUNITY)
Admission: RE | Admit: 2015-06-25 | Discharge: 2015-06-25 | Disposition: A | Discharge status: Home / Self Care | Payer: 59 | Source: Ambulatory Visit | Attending: Nurse Practitioner | Admitting: Nurse Practitioner

## 2015-06-25 ENCOUNTER — Inpatient Hospital Stay (HOSPITAL_COMMUNITY)
Admission: AD | Admit: 2015-06-25 | Discharge: 2015-06-28 | DRG: 389 | Disposition: A | Discharge status: Home / Self Care | Payer: 59 | Source: Ambulatory Visit | Attending: Internal Medicine | Admitting: Internal Medicine

## 2015-06-25 ENCOUNTER — Encounter (HOSPITAL_COMMUNITY): Payer: Self-pay

## 2015-06-25 VITALS — BP 151/1 | HR 108 | Temp 97.9°F | Resp 18 | Wt 169.4 lb

## 2015-06-25 DIAGNOSIS — K5660 Unspecified intestinal obstruction: Secondary | ICD-10-CM

## 2015-06-25 DIAGNOSIS — Z87891 Personal history of nicotine dependence: Secondary | ICD-10-CM

## 2015-06-25 DIAGNOSIS — R180 Malignant ascites: Secondary | ICD-10-CM | POA: Insufficient documentation

## 2015-06-25 DIAGNOSIS — Z79899 Other long term (current) drug therapy: Secondary | ICD-10-CM

## 2015-06-25 DIAGNOSIS — C55 Malignant neoplasm of uterus, part unspecified: Secondary | ICD-10-CM

## 2015-06-25 DIAGNOSIS — K5669 Other intestinal obstruction: Secondary | ICD-10-CM | POA: Diagnosis not present

## 2015-06-25 DIAGNOSIS — Z7952 Long term (current) use of systemic steroids: Secondary | ICD-10-CM

## 2015-06-25 DIAGNOSIS — C541 Malignant neoplasm of endometrium: Secondary | ICD-10-CM

## 2015-06-25 DIAGNOSIS — E876 Hypokalemia: Secondary | ICD-10-CM | POA: Diagnosis present

## 2015-06-25 DIAGNOSIS — C482 Malignant neoplasm of peritoneum, unspecified: Secondary | ICD-10-CM

## 2015-06-25 DIAGNOSIS — D6481 Anemia due to antineoplastic chemotherapy: Secondary | ICD-10-CM | POA: Diagnosis present

## 2015-06-25 DIAGNOSIS — G629 Polyneuropathy, unspecified: Secondary | ICD-10-CM | POA: Diagnosis present

## 2015-06-25 DIAGNOSIS — C801 Malignant (primary) neoplasm, unspecified: Secondary | ICD-10-CM | POA: Diagnosis not present

## 2015-06-25 DIAGNOSIS — Z791 Long term (current) use of non-steroidal anti-inflammatories (NSAID): Secondary | ICD-10-CM

## 2015-06-25 DIAGNOSIS — E039 Hypothyroidism, unspecified: Secondary | ICD-10-CM | POA: Diagnosis present

## 2015-06-25 DIAGNOSIS — C786 Secondary malignant neoplasm of retroperitoneum and peritoneum: Secondary | ICD-10-CM | POA: Diagnosis present

## 2015-06-25 DIAGNOSIS — Z90710 Acquired absence of both cervix and uterus: Secondary | ICD-10-CM

## 2015-06-25 DIAGNOSIS — K5904 Chronic idiopathic constipation: Secondary | ICD-10-CM

## 2015-06-25 DIAGNOSIS — K56609 Unspecified intestinal obstruction, unspecified as to partial versus complete obstruction: Secondary | ICD-10-CM | POA: Diagnosis present

## 2015-06-25 DIAGNOSIS — J449 Chronic obstructive pulmonary disease, unspecified: Secondary | ICD-10-CM | POA: Diagnosis present

## 2015-06-25 DIAGNOSIS — R188 Other ascites: Secondary | ICD-10-CM

## 2015-06-25 DIAGNOSIS — Z79891 Long term (current) use of opiate analgesic: Secondary | ICD-10-CM

## 2015-06-25 DIAGNOSIS — Z881 Allergy status to other antibiotic agents status: Secondary | ICD-10-CM

## 2015-06-25 DIAGNOSIS — Z880 Allergy status to penicillin: Secondary | ICD-10-CM

## 2015-06-25 DIAGNOSIS — Z9109 Other allergy status, other than to drugs and biological substances: Secondary | ICD-10-CM

## 2015-06-25 DIAGNOSIS — E0590 Thyrotoxicosis, unspecified without thyrotoxic crisis or storm: Secondary | ICD-10-CM | POA: Diagnosis present

## 2015-06-25 DIAGNOSIS — Z885 Allergy status to narcotic agent status: Secondary | ICD-10-CM

## 2015-06-25 DIAGNOSIS — Z9221 Personal history of antineoplastic chemotherapy: Secondary | ICD-10-CM

## 2015-06-25 DIAGNOSIS — T451X5A Adverse effect of antineoplastic and immunosuppressive drugs, initial encounter: Secondary | ICD-10-CM | POA: Diagnosis present

## 2015-06-25 DIAGNOSIS — I10 Essential (primary) hypertension: Secondary | ICD-10-CM | POA: Diagnosis present

## 2015-06-25 DIAGNOSIS — S025XXA Fracture of tooth (traumatic), initial encounter for closed fracture: Secondary | ICD-10-CM | POA: Diagnosis present

## 2015-06-25 DIAGNOSIS — X58XXXA Exposure to other specified factors, initial encounter: Secondary | ICD-10-CM | POA: Diagnosis present

## 2015-06-25 DIAGNOSIS — K219 Gastro-esophageal reflux disease without esophagitis: Secondary | ICD-10-CM | POA: Diagnosis present

## 2015-06-25 LAB — COMPREHENSIVE METABOLIC PANEL (CC13)
ALBUMIN: 3.3 g/dL — AB (ref 3.5–5.0)
ALT: 18 U/L (ref 0–55)
AST: 17 U/L (ref 5–34)
Alkaline Phosphatase: 113 U/L (ref 40–150)
Anion Gap: 11 mEq/L (ref 3–11)
BUN: 8.2 mg/dL (ref 7.0–26.0)
CHLORIDE: 102 meq/L (ref 98–109)
CO2: 28 meq/L (ref 22–29)
Calcium: 9.7 mg/dL (ref 8.4–10.4)
Creatinine: 0.7 mg/dL (ref 0.6–1.1)
EGFR: 90 mL/min/{1.73_m2} — AB (ref >90)
Glucose: 127 mg/dl (ref 70–140)
Potassium: 3.7 mEq/L (ref 3.5–5.1)
SODIUM: 140 meq/L (ref 136–145)
Total Bilirubin: 0.77 mg/dL (ref 0.20–1.20)
Total Protein: 7.1 g/dL (ref 6.4–8.3)

## 2015-06-25 LAB — CBC WITH DIFFERENTIAL/PLATELET
BASO%: 1 % (ref 0.0–2.0)
Basophils Absolute: 0.1 10*3/uL (ref 0.0–0.1)
EOS ABS: 0.1 10*3/uL (ref 0.0–0.5)
EOS%: 1 % (ref 0.0–7.0)
HEMATOCRIT: 35.4 % (ref 34.8–46.6)
HGB: 12.2 g/dL (ref 11.6–15.9)
LYMPH#: 2.3 10*3/uL (ref 0.9–3.3)
LYMPH%: 26.6 % (ref 14.0–49.7)
MCH: 34.7 pg — ABNORMAL HIGH (ref 25.1–34.0)
MCHC: 34.4 g/dL (ref 31.5–36.0)
MCV: 101 fL (ref 79.5–101.0)
MONO#: 1.3 10*3/uL — ABNORMAL HIGH (ref 0.1–0.9)
MONO%: 15.3 % — ABNORMAL HIGH (ref 0.0–14.0)
NEUT#: 4.9 10*3/uL (ref 1.5–6.5)
NEUT%: 56.1 % (ref 38.4–76.8)
PLATELETS: 400 10*3/uL (ref 145–400)
RBC: 3.51 10*6/uL — ABNORMAL LOW (ref 3.70–5.45)
RDW: 16.5 % — AB (ref 11.2–14.5)
WBC: 8.7 10*3/uL (ref 3.9–10.3)

## 2015-06-25 MED ORDER — KCL-LACTATED RINGERS-D5W 20 MEQ/L IV SOLN
INTRAVENOUS | Status: DC
  Administered 2015-06-25 – 2015-06-26 (×3): via INTRAVENOUS
  Filled 2015-06-25 (×5): qty 1000

## 2015-06-25 MED ORDER — ENOXAPARIN SODIUM 40 MG/0.4ML ~~LOC~~ SOLN
40.0000 mg | SUBCUTANEOUS | Status: DC
  Administered 2015-06-25 – 2015-06-27 (×3): 40 mg via SUBCUTANEOUS
  Filled 2015-06-25 (×3): qty 0.4

## 2015-06-25 MED ORDER — ONDANSETRON HCL 4 MG/2ML IJ SOLN: 4.0000 mg | Freq: Three times a day (TID) | INTRAMUSCULAR | Status: DC | PRN

## 2015-06-25 MED ORDER — MORPHINE SULFATE 2 MG/ML IJ SOLN: 1.0000 mg | INTRAMUSCULAR | Status: DC | PRN

## 2015-06-25 MED ORDER — PROMETHAZINE HCL 25 MG/ML IJ SOLN: 12.5000 mg | Freq: Four times a day (QID) | INTRAMUSCULAR | Status: DC | PRN

## 2015-06-25 MED ORDER — METOPROLOL TARTRATE 1 MG/ML IV SOLN
2.5000 mg | INTRAVENOUS | Status: DC | PRN
Start: 2015-06-25 — End: 2015-06-28

## 2015-06-25 NOTE — Assessment & Plan Note (Addendum)
Patient reports increased generalized abdominal discomfort, bloating, and feeling of fullness after only small amounts of fluids or  bites of food.  She's also complaining of increased nausea; and vomited one large amount earlier this morning; reporting that the vomitus was very foul smelling.  She has been constipated for the past several days; despite stool softeners and miralax.  She states that she is able to have a little liquid stool, occasionally only.  Patient denies any UTI symptoms whatsoever.  She denies any recent fevers or chills.  On exam - patient with decreased bowel sounds in all 4 quads.  Generalized abdomen with trace tenderness with palpation.  No rebound tenderness.  Does appear distended; with questionable ascites.  No flank pain bilaterally.  KUB obtained earlier today revealed: FINDINGS: Dilated small bowel loops in the left abdomen, suspicious for small-bowel obstruction. Upright view not obtained to evaluate for free air or air-fluid level. Colon decompressed.  Lumbar dextroscoliosis. No acute bony abnormality. Surgical clips the pelvis  IMPRESSION: Dilated small bowel loops suggestive of small bowel obstruction.  After reviewing all details and findings with Dr. Benay Spice and Dr. Marin Olp (on call)- patient will be direct admitted to the hospitalist for further evaluation and management.  Brief history and report were given to [facility] hospitalist prior to patient being transported to a telemetry bed per hospitalist orders.  Have also contacted Dr. Aldean Ast, surgical oncologist; to confirm that they will follow patient while admitted over the weekend. (Dr. Denman George out of town).

## 2015-06-25 NOTE — Progress Notes (Signed)
Dr. Fermin Schwab, GYN Oncologist, spoke with Dr. Verlon Au with the [facility] about patient being admitted.  Dr. Fermin Schwab recommended the standard care per the Hospitalist Team for SBO including IV fluid, NG if needed, bowel rest, imaging, consult with general surgery if needed, etc.  He informed that GYN Oncology NP would see her over the weekend as a consult.

## 2015-06-25 NOTE — H&P (Addendum)
History and Physical  Jessica Payne RSW:[id number] DOB: Oct 19, 1951 DOA: 06/25/2015  Referring physician: EDP PCP: Donnajean Lopes, MD   Chief Complaint: sbo  HPI: Jessica Payne is a 64 y.o. female  H/o uterine cancer s/p debulking surgery in 11/2014, peritoneal carcinomatosis s/p chemotherapy, last early July, is sent from oncology clinic for direct admission due to SBO.  Patient reported she started to have intermittent sharp ab pain a week ago, ab pain usually start after meal, improved after vomiting, she reported still has BM daily, but a real good BM was a week ago, she vomited once the last 24hrs, she denies fever, denies hematemesis.  She was seen at oncology clinic today, ab xray showed SBO, hospitalist service called to admit the patient.   Review of Systems:  Detail per HPI, Review of systems are otherwise negative  Past Medical History  Diagnosis Date  . Hypertension   . Acute peritonitis   . Ascites   . Low TSH level   . Hypothyroidism     1981   . Hyperthyroidism     03/2014   . GERD (gastroesophageal reflux disease)   . Headache     occasional migraine    Past Surgical History  Procedure Laterality Date  . Thyroidectomy    . Culposcopy     . Laparotomy N/A 01/12/2015    Procedure: EXPLORATORY LAPAROTOMY, RADICAL DEBULKING;  Surgeon: Everitt Amber, MD;  Location: [facility];  Service: Gynecology;  Laterality: N/A;  . Abdominal hysterectomy N/A 01/12/2015    Procedure: TOTAL HYSTERECTOMY ABDOMINAL;  Surgeon: Everitt Amber, MD;  Location: [facility];  Service: Gynecology;  Laterality: N/A;  . Salpingoophorectomy Bilateral 01/12/2015    Procedure: BILATERAL SALPINGO OOPHORECTOMY;  Surgeon: Everitt Amber, MD;  Location: [facility];  Service: Gynecology;  Laterality: Bilateral;  . Omentectomy N/A 01/12/2015    Procedure: OMENTECTOMY;  Surgeon: Everitt Amber, MD;  Location: [facility];  Service: Gynecology;  Laterality: N/A;   Social History:  reports that she quit smoking about 10 months ago. She  has never used smokeless tobacco. She reports that she does not drink alcohol or use illicit drugs. Patient lives at home & is able to participate in activities of daily living independently   Allergies  Allergen Reactions  . Ciprofloxacin Other (See Comments)    Mouth burned  . Merthiolate [Thimerosal] Hives  . Penicillins Hives  . Tramadol Nausea And Vomiting    History reviewed. No pertinent family history.    Prior to Admission medications   Medication Sig Start Date End Date Taking? Authorizing Provider  acyclovir (ZOVIRAX) 200 MG capsule Take 1 capsule (200 mg total) by mouth 2 (two) times daily. For mouth Ulcers Patient taking differently: Take 200 mg by mouth 2 (two) times daily as needed. For mouth Ulcers 04/29/15  Yes Lennis Marion Downer, MD  benzonatate (TESSALON) 100 MG capsule Take 2 capsules (200 mg total) by mouth 3 (three) times daily as needed for cough. 03/10/15  Yes Susanne Borders, NP  dexamethasone (DECADRON) 4 MG tablet Take 1 tablet by mouth as directed. Before and after chemo 04/30/15  Yes Historical Provider, MD  diphenhydrAMINE (BENADRYL) 25 mg capsule Take 25 mg by mouth every 6 (six) hours as needed for allergies.    Yes Historical Provider, MD  docusate sodium (COLACE) 100 MG capsule Take 100 mg by mouth 2 (two) times daily as needed for moderate constipation.    Yes Historical Provider, MD  loratadine (CLARITIN) 10 MG tablet Take 10 mg by  mouth daily as needed for allergies.  03/01/15  Yes Historical Provider, MD  LORazepam (ATIVAN) 1 MG tablet Place 1/2-1 tablet under the tongue or swallow every 6 hrs as needed for nausea.  Will make drowsy 03/01/15  Yes Lennis Marion Downer, MD  naproxen sodium (ANAPROX) 220 MG tablet Take 440 mg by mouth 2 (two) times daily as needed (for pain).   Yes Historical Provider, MD  ondansetron (ZOFRAN) 8 MG tablet Take 1 tablet (8 mg total) by mouth every 8 (eight) hours as needed for nausea or vomiting. 05/13/15  Yes Lennis Marion Downer, MD    oxyCODONE (OXY IR/ROXICODONE) 5 MG immediate release tablet Take 1 tablet (5 mg total) by mouth every 4 (four) hours as needed for moderate pain or breakthrough pain. 05/27/15  Yes Lennis Marion Downer, MD  pantoprazole (PROTONIX) 40 MG tablet Take 1 tablet (40 mg total) by mouth daily. Patient taking differently: Take 40 mg by mouth daily as needed (heartburn acid reflux).  03/25/15  Yes Lennis Marion Downer, MD  polyethylene glycol powder (GLYCOLAX/MIRALAX) powder Take 17 g by mouth as needed for severe constipation.    Yes Historical Provider, MD  promethazine (PHENERGAN) 25 MG suppository Place 1 suppository (25 mg total) rectally every 6 (six) hours as needed for nausea or vomiting (NO NOT USE SUPPOSITORY IF  WHITE BLOOD COUNT IS LOW   Will Make drowsy). 02/08/15  Yes Lennis Marion Downer, MD  sennosides-docusate sodium (SENOKOT-S) 8.6-50 MG tablet Take 1 tablet by mouth 2 (two) times daily as needed for constipation.    Yes Historical Provider, MD  triamcinolone (KENALOG) 0.1 % paste Apply to tongue ulcer once daily at bedtime. Patient taking differently: Use as directed 1 application in the mouth or throat at bedtime as needed. Apply to tongue ulcer once daily at bedtime. 03/11/15  Yes Lennis Marion Downer, MD  ferrous fumarate (HEMOCYTE) 325 (106 FE) MG TABS tablet Take 1 tablet on an empty stomach with OJ or Vitamin C 500 mg tab. Patient not taking: Reported on 03/11/2015 03/01/15   Gordy Levan, MD  sucralfate (CARAFATE) 1 G tablet Take 1 tablet (1 g total) by mouth 4 (four) times daily -  with meals and at bedtime. Patient not taking: Reported on 04/08/2015 03/10/15   Susanne Borders, NP    Physical Exam: BP 137/67 mmHg  Pulse 97  Temp(Src) 98.4 F (36.9 C) (Oral)  Resp 18  Ht 5\' 7"  (1.702 m)  SpO2 96%  General:  Pale, NAD Eyes: PERRL ENT: alopecia, unremarkable Neck: supple, no JVD Cardiovascular: RRR Respiratory: CTABL Abdomen: soft/ND, slight decreased bowel sounds, mild distended, mild  tender Skin: no rash Musculoskeletal:  No edema Psychiatric: calm/cooperative, pleasant Neurologic: no focal findings            Labs on Admission:  Basic Metabolic Panel:  Recent Labs Lab 06/25/15 1159  NA 140  K 3.7  CO2 28  GLUCOSE 127  BUN 8.2  CREATININE 0.7  CALCIUM 9.7   Liver Function Tests:  Recent Labs Lab 06/25/15 1159  AST 17  ALT 18  ALKPHOS 113  BILITOT 0.77  PROT 7.1  ALBUMIN 3.3*   No results for input(s): LIPASE, AMYLASE in the last 168 hours. No results for input(s): AMMONIA in the last 168 hours. CBC:  Recent Labs Lab 06/25/15 1159  WBC 8.7  NEUTROABS 4.9  HGB 12.2  HCT 35.4  MCV 101.0  PLT 400   Cardiac Enzymes: No results for input(s): CKTOTAL, CKMB,  CKMBINDEX, TROPONINI in the last 168 hours.  BNP (last 3 results) No results for input(s): BNP in the last 8760 hours.  ProBNP (last 3 results) No results for input(s): PROBNP in the last 8760 hours.  CBG: No results for input(s): GLUCAP in the last 168 hours.  Radiological Exams on Admission: Dg Abd 1 View  06/25/2015   CLINICAL DATA:  Peritoneal adenocarcinoma. Constipation. Uterine cancer.  EXAM: ABDOMEN - 1 VIEW  COMPARISON:  CT abdomen pelvis 12/15/2014  FINDINGS: Dilated small bowel loops in the left abdomen, suspicious for small-bowel obstruction. Upright view not obtained to evaluate for free air or air-fluid level. Colon decompressed.  Lumbar dextroscoliosis. No acute bony abnormality. Surgical clips the pelvis  IMPRESSION: Dilated small bowel loops suggestive of small bowel obstruction.   Electronically Signed   By: Franchot Gallo M.D.   On: 06/25/2015 12:38      Assessment/Plan Present on Admission:  . SBO (small bowel obstruction)    SBO: bowel rest, ng intermittent suction. IVF, Prn analgesics/antiemetics. Talked to Dr. Fermin Schwab, recommended conservative management,state not need to consult general surgery, NP from gynonc will see patient this weekend.  H/o  uterine cancer s/p debulking surgery in 11/2014, peritoneal carcinomatosis s/p chemotherapy, last early July.  Oncology Dr. Marin Olp consulted and saw the patient.  DVT prophylaxis: lovenox  Consultants:  Oncology Dr. Marin Olp  Gynecology oncology Dr. Fermin Schwab over the phone, he states NP Joylene John will see patient over the weekend, he states no need of general surgery consult.  Code Status: full   Family Communication:  Patient , her sister and her son in room  Disposition Plan: admit to med -surg  Time spent: >23mins More than 50% of time spent on coordination of care, I have tried to call oncology office, gynonc office, finally has to go through [facility] operator and talked to [facility] consultation coordinator for prolonged time.  Ulysses Alper MD, PhD [facility] Pager [phone] If 7PM-7AM, please contact night-coverage at [email or web address], password [facility]

## 2015-06-25 NOTE — Telephone Encounter (Signed)
ON 05/27/15 PT. RECEIVED CARBOPLATIN. SHE IS NOW ON A CHEMO BREAK. ON 06/10/15 PT. SAW DR.LIVESAY AND WAS TAKING MIRALAX TWICE A DAY WITH GOOD RESULTS. ON 06/18/15 PT. WAS CONSTIPATED AND HAD SOME NAUSEA. SHE TOOK MIRALAX AND ON 06/19/15 SHE VOMITED X5 AND HAD DIARRHEA X4. ON 06/23/15 PT. TOOK SOME STOOL SOFTENERS. ON 06/24/15 SHE HAD DIARRHEA X3. HER ABDOMEN IS DISTENDED. SHE IS HAVING A SMALL AMOUNT OF BOWEL SOUNDS AND IS PASSING A LITTLE GAS. PT. STATES SHE HAS HAD FLUID REMOVED FROM HER ABDOMEN BEFORE AND MAY NEED THAT DONE. PT. IS NAUSEATED BUT HAS NOT VOMITED. NO FEVER. VERBAL ORDER AND READ BACK TO CYNDEE BACON,NP- PT. TO COME NOW FOR LAB AND ABDOMINAL X-RAY AND SEE CYNDEE BACON,NP. POF TO URGENT SCHEDULER

## 2015-06-25 NOTE — Consult Note (Signed)
Referral MD  Reason for Referral: Bowel obstruction; history of serous peritoneal carcinoma and endometrial carcinoma   No chief complaint on file. : I'm having abdominal pain and getting sick.  HPI: Ms. Balsam is a very charming 64 year old white female. She is being followed Dr. Marko Plume. She has 2 separate GYN primaries. She presented back in general this year with ascites. This is not a be malignant. She ultimately underwent a TAH/BSO along with the bulking. She had 4 L of ascites. She had extensive tumor involving her abdomen and pelvis. 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 She also had a stage IB endometrial cancer.  She has undergone chemotherapy with dose dense carboplatin/Taxol. He had 6 cycles. 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  She now has about a one-week history of abdominal fullness. Her appetite is decreased. She's had some vomiting. She had an abdominal film in the office. This showed dilated small bowel loops suggestive of small bowel obstruction.  She is admitted.  She actually looks quite good. She is not having much in way of pain. There is some slight abdominal fullness. She's had no fever. She's had no bleeding. She's had no dysuria. There's been no leg swelling. She's had no cough or shortness of breath.  Unfortunately, her last CA- 125 was go back up. It was down to 136. On July 7th was up to 173. We will see what it is now.  Again, we were asked to see her to try to help out with management.  She had some scans set up for Monday.  Currently, her performance status is ECOG 1-2.     Past Medical History  Diagnosis Date  . Hypertension   . Acute peritonitis   . Ascites   . Low TSH level   . Hypothyroidism     1981   . Hyperthyroidism     03/2014   . GERD (gastroesophageal reflux disease)   . Headache     occasional migraine   :  Past Surgical History  Procedure Laterality Date  . Thyroidectomy     . Culposcopy     . Laparotomy N/A 01/12/2015    Procedure: EXPLORATORY LAPAROTOMY, RADICAL DEBULKING;  Surgeon: Everitt Amber, MD;  Location: [facility];  Service: Gynecology;  Laterality: N/A;  . Abdominal hysterectomy N/A 01/12/2015    Procedure: TOTAL HYSTERECTOMY ABDOMINAL;  Surgeon: Everitt Amber, MD;  Location: [facility];  Service: Gynecology;  Laterality: N/A;  . Salpingoophorectomy Bilateral 01/12/2015    Procedure: BILATERAL SALPINGO OOPHORECTOMY;  Surgeon: Everitt Amber, MD;  Location: [facility];  Service: Gynecology;  Laterality: Bilateral;  . Omentectomy N/A 01/12/2015    Procedure: OMENTECTOMY;  Surgeon: Everitt Amber, MD;  Location: [facility];  Service: Gynecology;  Laterality: N/A;  :   Current facility-administered medications:  .  dextrose 5% in lactated ringers with KCl 20 mEq/L infusion, , Intravenous, Continuous, Florencia Reasons, MD .  enoxaparin (LOVENOX) injection 40 mg, 40 mg, Subcutaneous, Q24H, Florencia Reasons, MD .  metoprolol (LOPRESSOR) injection 2.5 mg, 2.5 mg, Intravenous, Q4H PRN, Florencia Reasons, MD .  morphine 2 MG/ML injection 1 mg, 1 mg, Intravenous, Q4H PRN, Florencia Reasons, MD .  ondansetron [facility]) injection 4 mg, 4 mg, Intravenous, Q8H PRN, Florencia Reasons, MD .  promethazine (PHENERGAN) injection 12.5 mg, 12.5 mg, Intravenous, Q6H PRN, Florencia Reasons, MD:  . enoxaparin (LOVENOX) injection  40 mg Subcutaneous Q24H  :  Allergies  Allergen Reactions  . Ciprofloxacin Other (See Comments)  Mouth burned  . Merthiolate [Thimerosal] Hives  . Penicillins Hives  . Tramadol Nausea And Vomiting  :  History reviewed. No pertinent family history.:  History   Social History  . Marital Status: Married    Spouse Name: N/A  . Number of Children: N/A  . Years of Education: N/A   Occupational History  . Not on file.   Social History Main Topics  . Smoking status: Former Smoker -- 1.00 packs/day for 20 years    Quit date: 08/20/2014  . Smokeless tobacco: Never Used  . Alcohol Use: No  . Drug Use: No  . Sexual  Activity: Not Currently   Other Topics Concern  . Not on file   Social History Narrative  :  Pertinent items are noted in HPI.  Exam: Patient Vitals for the past 24 hrs:  BP Temp Temp src Pulse Resp SpO2 Height Weight  06/25/15 1524 137/67 mmHg 98.4 F (36.9 C) Oral 97 18 96 % 5\' 7"  (1.702 m) 169 lb 6.4 oz (76.839 kg)    well-developed and well-nourished white female in no obvious distress. Head and neck exam shows no ocular or oral lesions. She has no palpable cervical or supraclavicular lymph nodes. Lungs are clear. Cardiac exam regular rate and rhythm with no murmurs, rubs or bruits. Abdomen is soft. She has a laparotomy wound has well-healed. She has some slight distention. No obvious fluid wave. No obvious abdominal mass. There is no palpable liver or spleen tip. Back exam shows no tenderness over the spine, ribs or hips. Extremities shows no clubbing, cyanosis or edema. She's good range motion of her joints. She is good strength in her upper and lower extremity. Skin exam shows no rashes. Neurological exam is nonfocal.    Recent Labs  06/25/15 1159  WBC 8.7  HGB 12.2  HCT 35.4  PLT 400    Recent Labs  06/25/15 1159  NA 140  K 3.7  CO2 28  GLUCOSE 127  BUN 8.2  CREATININE 0.7  CALCIUM 9.7    Blood smear review:  None  Pathology: None     Assessment and Plan:  Ms. Roehr is a 64 year old white female. She has 2 separate primaries. She was treated with chemotherapy for the peritoneal primary. She had 6 cycles of Taxol/carboplatinum.  I would have to think that it would be clinically troublesome that her CA-125 is headed back up. It'll be interesting to see what it is now.  I will probably set her scans up for the weekend. I think these will be very important.  She needs IV fluids. She'll also have antiemetics as indicated.  The scans, I think, will really tell us what is going on. I would have to worry that her obstruction is because of malignancy and not from  scar tissue.  Her son and daughter were with her. They are all very nice. We had a very good time area and we had good fellowship.  They actually grew up close to where my wife and her family grew up in [place]!!  Lester E

## 2015-06-25 NOTE — Progress Notes (Signed)
SYMPTOM MANAGEMENT CLINIC   HPI: Jessica Payne 64 y.o. female diagnosed with uterine cancer.  Patient is status post carboplatin/Taxol chemotherapy regimen.  Patient reports increased generalized abdominal discomfort, bloating, and feeling of fullness after only small amounts of fluids or  bites of food.  She's also complaining of increased nausea; and vomited one large amount earlier this morning; reporting that the vomitus was very foul smelling.  She has been constipated for the past several days; despite stool softeners and miralax.  She states that she is able to have a little liquid stool, occasionally only.  Patient denies any UTI symptoms whatsoever.  She denies any recent fevers or chills.  HPI  ROS  Past Medical History  Diagnosis Date  . Hypertension   . Acute peritonitis   . Ascites   . Low TSH level   . Hypothyroidism     1981   . Hyperthyroidism     03/2014   . GERD (gastroesophageal reflux disease)   . Headache     occasional migraine     Past Surgical History  Procedure Laterality Date  . Thyroidectomy    . Culposcopy     . Laparotomy N/A 01/12/2015    Procedure: EXPLORATORY LAPAROTOMY, RADICAL DEBULKING;  Surgeon: Everitt Amber, MD;  Location: [facility];  Service: Gynecology;  Laterality: N/A;  . Abdominal hysterectomy N/A 01/12/2015    Procedure: TOTAL HYSTERECTOMY ABDOMINAL;  Surgeon: Everitt Amber, MD;  Location: [facility];  Service: Gynecology;  Laterality: N/A;  . Salpingoophorectomy Bilateral 01/12/2015    Procedure: BILATERAL SALPINGO OOPHORECTOMY;  Surgeon: Everitt Amber, MD;  Location: [facility];  Service: Gynecology;  Laterality: Bilateral;  . Omentectomy N/A 01/12/2015    Procedure: OMENTECTOMY;  Surgeon: Everitt Amber, MD;  Location: [facility];  Service: Gynecology;  Laterality: N/A;    has Malignant ascites; Postmenopausal bleeding; Cervical stenosis (uterine cervix); Uterine cancer; Primary peritoneal adenocarcinoma; History of tobacco abuse; H/O partial  thyroidectomy; Broken or cracked tooth, nontraumatic; Constipation - functional; Esophageal reflux; Chemotherapy-induced nausea; Urinary frequency; Bacterial UTI; UTI (lower urinary tract infection); Fever; Esophagitis; Chemotherapy induced neutropenia; Iron deficiency anemia; Antineoplastic chemotherapy induced anemia; Pyrexia; Mouth ulcer; Endometrial carcinoma; Chemotherapy-induced peripheral neuropathy; Small bowel obstruction; SBO (small bowel obstruction); Ascites, malignant; and Hypomagnesemia on her problem list.    is allergic to ciprofloxacin; merthiolate; penicillins; and tramadol.    Medication List       This list is accurate as of: 06/25/15  3:15 PM.  Always use your most recent med list.               acyclovir 200 MG capsule  Commonly known as:  ZOVIRAX  Take 1 capsule (200 mg total) by mouth 2 (two) times daily. For mouth Ulcers     benzonatate 100 MG capsule  Commonly known as:  TESSALON  Take 2 capsules (200 mg total) by mouth 3 (three) times daily as needed for cough.     dexamethasone 4 MG tablet  Commonly known as:  DECADRON  Take 1 tablet by mouth as directed. Before and after chemo     diphenhydrAMINE 25 mg capsule  Commonly known as:  BENADRYL  Take 25 mg by mouth every 6 (six) hours as needed for allergies.     docusate sodium 100 MG capsule  Commonly known as:  COLACE  Take 100 mg by mouth 2 (two) times daily as needed for moderate constipation.     feeding supplement Liqd  Take 1 Container by mouth 3 (three)  times daily between meals.     ferrous fumarate 325 (106 FE) MG Tabs tablet  Commonly known as:  HEMOCYTE  Take 1 tablet on an empty stomach with OJ or Vitamin C 500 mg tab.     lisinopril 10 MG tablet  Commonly known as:  PRINIVIL,ZESTRIL  Take 10 mg by mouth at bedtime.     loratadine 10 MG tablet  Commonly known as:  CLARITIN  Take 10 mg by mouth daily as needed for allergies.     LORazepam 1 MG tablet  Commonly known as:  ATIVAN    Place 1/2-1 tablet under the tongue or swallow every 6 hrs as needed for nausea.  Will make drowsy     magnesium oxide 400 (241.3 MG) MG tablet  Commonly known as:  MAG-OX  Take 1 tablet (400 mg total) by mouth 2 (two) times daily.     naproxen sodium 220 MG tablet  Commonly known as:  ANAPROX  Take 440 mg by mouth 2 (two) times daily as needed (for pain).     ondansetron 8 MG tablet  Commonly known as:  ZOFRAN  Take 1 tablet (8 mg total) by mouth every 8 (eight) hours as needed for nausea or vomiting.     oxyCODONE 5 MG immediate release tablet  Commonly known as:  Oxy IR/ROXICODONE  Take 1 tablet (5 mg total) by mouth every 4 (four) hours as needed for moderate pain or breakthrough pain.     pantoprazole 40 MG tablet  Commonly known as:  PROTONIX  Take 1 tablet (40 mg total) by mouth daily.     polyethylene glycol powder powder  Commonly known as:  GLYCOLAX/MIRALAX  Take 17 g by mouth as needed for severe constipation.     potassium chloride 10 MEQ tablet  Commonly known as:  K-DUR  Take 2 tablets today and then 1 daily     promethazine 25 MG suppository  Commonly known as:  PHENERGAN  Place 1 suppository (25 mg total) rectally every 6 (six) hours as needed for nausea or vomiting (NO NOT USE SUPPOSITORY IF  WHITE BLOOD COUNT IS LOW   Will Make drowsy).     sennosides-docusate sodium 8.6-50 MG tablet  Commonly known as:  SENOKOT-S  Take 1 tablet by mouth 2 (two) times daily as needed for constipation.     sucralfate 1 G tablet  Commonly known as:  CARAFATE  Take 1 tablet (1 g total) by mouth 4 (four) times daily -  with meals and at bedtime.     triamcinolone 0.1 % paste  Commonly known as:  KENALOG  Apply to tongue ulcer once daily at bedtime.         PHYSICAL EXAMINATION  Oncology Vitals 06/28/2015 06/27/2015 06/27/2015 06/27/2015 06/26/2015 06/26/2015 06/26/2015  Height - - - - - - -  Weight - - - - - - -  Weight (lbs) - - - - - - -  BMI (kg/m2) - - - - - - -  Temp  98.2 97.5 - 97.7 97.6 98.3 98.3  Pulse 94 90 102 90 91 93 89  Resp _0 SpO2 94 96 95 92 92 96 95  BSA (m2) - - - - - - -   BP Readings from Last 3 Encounters:  06/28/15 122/73  06/25/15 151/1  06/10/15 136/71    Physical Exam  Constitutional: She is well-developed, well-nourished, and in no distress.  HENT:  Head: Normocephalic  and atraumatic.  Mouth/Throat: Oropharynx is clear and moist.  Eyes: Conjunctivae and EOM are normal. Pupils are equal, round, and reactive to light. Right eye exhibits no discharge. Left eye exhibits no discharge. No scleral icterus.  Neck: Normal range of motion. Neck supple. No JVD present. No tracheal deviation present. No thyromegaly present.  Cardiovascular: Normal rate, regular rhythm, normal heart sounds and intact distal pulses.   Pulmonary/Chest: Effort normal and breath sounds normal. No respiratory distress. She has no wheezes. She has no rales. She exhibits no tenderness.  Abdominal: She exhibits distension. She exhibits no mass. There is tenderness. There is no rebound and no guarding.  On exam - patient with decreased bowel sounds in all 4 quads.  Generalized abdomen with trace tenderness with palpation.  No rebound tenderness.  Does appear distended; with questionable ascites.  No flank pain bilaterally.  Lymphadenopathy:    She has no cervical adenopathy.  Nursing note and vitals reviewed.   LABORATORY DATA:. Admission on 06/25/2015  Component Date Value Ref Range Status  . WBC 06/26/2015 5.7  4.0 - 10.5 K/uL Final  . RBC 06/26/2015 3.06* 3.87 - 5.11 MIL/uL Final  . Hemoglobin 06/26/2015 10.2* 12.0 - 15.0 g/dL Final  . HCT 06/26/2015 30.7* 36.0 - 46.0 % Final  . MCV 06/26/2015 100.3* 78.0 - 100.0 fL Final  . MCH 06/26/2015 33.3  26.0 - 34.0 pg Final  . MCHC 06/26/2015 33.2  30.0 - 36.0 g/dL Final  . RDW 06/26/2015 14.5  11.5 - 15.5 % Final  . Platelets 06/26/2015 339  150 - 400 K/uL Final  . Sodium 06/26/2015 140  135  - 145 mmol/L Final  . Potassium 06/26/2015 3.3* 3.5 - 5.1 mmol/L Final  . Chloride 06/26/2015 101  101 - 111 mmol/L Final  . CO2 06/26/2015 30  22 - 32 mmol/L Final  . Glucose, Bld 06/26/2015 118* 65 - 99 mg/dL Final  . BUN 06/26/2015 10  6 - 20 mg/dL Final  . Creatinine, Ser 06/26/2015 0.56  0.44 - 1.00 mg/dL Final  . Calcium 06/26/2015 8.7* 8.9 - 10.3 mg/dL Final  . Total Protein 06/26/2015 6.4* 6.5 - 8.1 g/dL Final  . Albumin 06/26/2015 3.0* 3.5 - 5.0 g/dL Final  . AST 06/26/2015 18  15 - 41 U/L Final  . ALT 06/26/2015 14  14 - 54 U/L Final  . Alkaline Phosphatase 06/26/2015 81  38 - 126 U/L Final  . Total Bilirubin 06/26/2015 0.5  0.3 - 1.2 mg/dL Final  . GFR calc non Af Amer 06/26/2015 >60  >60 mL/min Final  . GFR calc Af Amer 06/26/2015 >60  >60 mL/min Final   Comment: (NOTE) The eGFR has been calculated using the CKD EPI equation. This calculation has not been validated in all clinical situations. eGFR's persistently <60 mL/min signify possible Chronic Kidney Disease.   . Anion gap 06/26/2015 9  5 - 15 Final  . Magnesium 06/26/2015 1.2* 1.7 - 2.4 mg/dL Final  . TSH 06/26/2015 <0.010* 0.350 - 4.500 uIU/mL Final  . Prothrombin Time 06/26/2015 15.1  11.6 - 15.2 seconds Final  . INR 06/26/2015 1.17  0.00 - 1.49 Final  . CA 125 06/26/2015 376.8* 0.0 - 38.1 U/mL Final   Comment: (NOTE) Roche ECLIA methodology Performed At: [facility] [address] , [place] [id number] [place] William F MD Ph:[phone]   . WBC 06/27/2015 7.3  4.0 - 10.5 K/uL Final  . RBC 06/27/2015 3.16* 3.87 - 5.11 MIL/uL Final  . Hemoglobin 06/27/2015   10.6* 12.0 - 15.0 g/dL Final  . HCT 06/27/2015 32.1* 36.0 - 46.0 % Final  . MCV 06/27/2015 101.6* 78.0 - 100.0 fL Final  . MCH 06/27/2015 33.5  26.0 - 34.0 pg Final  . MCHC 06/27/2015 33.0  30.0 - 36.0 g/dL Final  . RDW 06/27/2015 14.5  11.5 - 15.5 % Final  . Platelets 06/27/2015 365  150 - 400 K/uL Final  . Sodium 06/27/2015 138  135 - 145  mmol/L Final  . Potassium 06/27/2015 3.8  3.5 - 5.1 mmol/L Final  . Chloride 06/27/2015 100* 101 - 111 mmol/L Final  . CO2 06/27/2015 29  22 - 32 mmol/L Final  . Glucose, Bld 06/27/2015 115* 65 - 99 mg/dL Final  . BUN 06/27/2015 6  6 - 20 mg/dL Final  . Creatinine, Ser 06/27/2015 0.60  0.44 - 1.00 mg/dL Final  . Calcium 06/27/2015 8.9  8.9 - 10.3 mg/dL Final  . GFR calc non Af Amer 06/27/2015 >60  >60 mL/min Final  . GFR calc Af Amer 06/27/2015 >60  >60 mL/min Final   Comment: (NOTE) The eGFR has been calculated using the CKD EPI equation. This calculation has not been validated in all clinical situations. eGFR's persistently <60 mL/min signify possible Chronic Kidney Disease.   . Anion gap 06/27/2015 9  5 - 15 Final  . Magnesium 06/27/2015 1.3* 1.7 - 2.4 mg/dL Final  . Sodium 06/28/2015 137  135 - 145 mmol/L Final  . Potassium 06/28/2015 3.7  3.5 - 5.1 mmol/L Final  . Chloride 06/28/2015 100* 101 - 111 mmol/L Final  . CO2 06/28/2015 30  22 - 32 mmol/L Final  . Glucose, Bld 06/28/2015 90  65 - 99 mg/dL Final  . BUN 06/28/2015 9  6 - 20 mg/dL Final  . Creatinine, Ser 06/28/2015 0.61  0.44 - 1.00 mg/dL Final  . Calcium 06/28/2015 8.9  8.9 - 10.3 mg/dL Final  . GFR calc non Af Amer 06/28/2015 >60  >60 mL/min Final  . GFR calc Af Amer 06/28/2015 >60  >60 mL/min Final   Comment: (NOTE) The eGFR has been calculated using the CKD EPI equation. This calculation has not been validated in all clinical situations. eGFR's persistently <60 mL/min signify possible Chronic Kidney Disease.   . Anion gap 06/28/2015 7  5 - 15 Final  . Magnesium 06/28/2015 1.5* 1.7 - 2.4 mg/dL Final  Appointment on 06/25/2015  Component Date Value Ref Range Status  . WBC 06/25/2015 8.7  3.9 - 10.3 10e3/uL Final  . NEUT# 06/25/2015 4.9  1.5 - 6.5 10e3/uL Final  . HGB 06/25/2015 12.2  11.6 - 15.9 g/dL Final  . HCT 06/25/2015 35.4  34.8 - 46.6 % Final  . Platelets 06/25/2015 400  145 - 400 10e3/uL Final  . MCV  06/25/2015 101.0  79.5 - 101.0 fL Final  . MCH 06/25/2015 34.7* 25.1 - 34.0 pg Final  . MCHC 06/25/2015 34.4  31.5 - 36.0 g/dL Final  . RBC 06/25/2015 3.51* 3.70 - 5.45 10e6/uL Final  . RDW 06/25/2015 16.5* 11.2 - 14.5 % Final  . lymph# 06/25/2015 2.3  0.9 - 3.3 10e3/uL Final  . MONO# 06/25/2015 1.3* 0.1 - 0.9 10e3/uL Final  . Eosinophils Absolute 06/25/2015 0.1  0.0 - 0.5 10e3/uL Final  . Basophils Absolute 06/25/2015 0.1  0.0 - 0.1 10e3/uL Final  . NEUT% 06/25/2015 56.1  38.4 - 76.8 % Final  . LYMPH% 06/25/2015 26.6  14.0 - 49.7 % Final  . MONO% 06/25/2015 15.3* 0.0 -  14.0 % Final  . EOS% 06/25/2015 1.0  0.0 - 7.0 % Final  . BASO% 06/25/2015 1.0  0.0 - 2.0 % Final  . Sodium 06/25/2015 140  136 - 145 mEq/L Final  . Potassium 06/25/2015 3.7  3.5 - 5.1 mEq/L Final  . Chloride 06/25/2015 102  98 - 109 mEq/L Final  . CO2 06/25/2015 28  22 - 29 mEq/L Final  . Glucose 06/25/2015 127  70 - 140 mg/dl Final  . BUN 06/25/2015 8.2  7.0 - 26.0 mg/dL Final  . Creatinine 06/25/2015 0.7  0.6 - 1.1 mg/dL Final  . Total Bilirubin 06/25/2015 0.77  0.20 - 1.20 mg/dL Final  . Alkaline Phosphatase 06/25/2015 113  40 - 150 U/L Final  . AST 06/25/2015 17  5 - 34 U/L Final  . ALT 06/25/2015 18  0 - 55 U/L Final  . Total Protein 06/25/2015 7.1  6.4 - 8.3 g/dL Final  . Albumin 06/25/2015 3.3* 3.5 - 5.0 g/dL Final  . Calcium 06/25/2015 9.7  8.4 - 10.4 mg/dL Final  . Anion Gap 06/25/2015 11  3 - 11 mEq/L Final  . EGFR 06/25/2015 90* >90 ml/min/1.73 m2 Final   eGFR is calculated using the CKD-EPI Creatinine Equation (2009)     RADIOGRAPHIC STUDIES: Dg Abd 1 View  06/25/2015   CLINICAL DATA:  Peritoneal adenocarcinoma. Constipation. Uterine cancer.  EXAM: ABDOMEN - 1 VIEW  COMPARISON:  CT abdomen pelvis 12/15/2014  FINDINGS: Dilated small bowel loops in the left abdomen, suspicious for small-bowel obstruction. Upright view not obtained to evaluate for free air or air-fluid level. Colon decompressed.  Lumbar  dextroscoliosis. No acute bony abnormality. Surgical clips the pelvis  IMPRESSION: Dilated small bowel loops suggestive of small bowel obstruction.   Electronically Signed   By: Charles  Clark M.D.   On: 06/25/2015 12:38   Ct Abdomen Pelvis W Contrast  06/26/2015   CLINICAL DATA:  64-year-old female with 1 week history of abdominal fullness. Decreased appetite. Vomiting.  EXAM: CT ABDOMEN AND PELVIS WITH CONTRAST  TECHNIQUE: Multidetector CT imaging of the abdomen and pelvis was performed using the standard protocol following bolus administration of intravenous contrast.  CONTRAST:  50mL OMNIPAQUE IOHEXOL 300 MG/ML SOLN, 100mL OMNIPAQUE IOHEXOL 300 MG/ML SOLN  COMPARISON:  CT of the abdomen and pelvis 12/15/2014.  FINDINGS: Lower chest:  Unremarkable.  Hepatobiliary: 1.2 cm rim calcified gallstone in the gallbladder. No current findings to suggest an acute cholecystitis at this time. 1.5 cm low-attenuation lesion in segment 8 of the liver is compatible with a simple cyst. Other smaller low-attenuation liver lesions are too small to definitively characterize, but are similar to prior examination and also likely small cysts. No other suspicious appearing hepatic lesions. No intra or extrahepatic biliary ductal dilatation.  Pancreas: No pancreatic mass. No pancreatic ductal dilatation. No pancreatic or peripancreatic fluid or inflammatory changes.  Spleen: Unremarkable.  Adrenals/Urinary Tract: Bilateral adrenal glands and bilateral kidneys are normal in appearance. No hydroureteronephrosis. Urinary bladder is normal in appearance.  Stomach/Bowel: Nasogastric tube extending into the fundus of the stomach. Stomach is otherwise normal in appearance. No pathologic distention of small bowel or colon. Numerous colonic diverticulae, without definite surrounding inflammatory changes to suggest an acute diverticulitis at this time. Normal appendix.  Vascular/Lymphatic: Extensive atherosclerosis in the abdominal and pelvic  vasculature, including fusiform ectasia of the infrarenal abdominal aorta which measures up to 2.8 x 2.9 cm. No lymphadenopathy noted in the abdomen or pelvis.  Reproductive: Status post hysterectomy. Ovaries are   not confidently identified may be surgically absent or atrophic.  Other: Moderate to large volume of ascites which is increased compared to the prior study, and is now clearly associated with areas of peritoneal thickening and enhancement, as well as internal septations (image 41 of series 2 in the right side of the abdomen). No pneumoperitoneum.  Musculoskeletal: Chronic appearing compression fracture of T12 with approximately 15-20% loss of anterior vertebral body height. Sclerotic lesions in the pelvis bilaterally, similar to prior study 12/15/2014, presumably bone islands. There are no aggressive appearing lytic or blastic lesions noted in the visualized portions of the skeleton.  IMPRESSION: 1. Increasing volume of mildly complex ascites, now with extensive peritoneal thickening and enhancement. While this may simply reflect changes from chronic peritonitis, the possibility of malignancy in the peritoneal cavity should be considered, and correlation with nonemergent paracentesis should be considered, if not recently performed. 2. Cholelithiasis without evidence of acute cholecystitis at this time. 3. Colonic diverticulosis without evidence of acute diverticulitis. 4. Extensive atherosclerosis, including fusiform ectasia of the infrarenal abdominal aorta which measures 2.8 x 2.9 cm. 5. Additional incidental findings, as above.   Electronically Signed   By: Daniel  Entrikin M.D.   On: 06/26/2015 11:48   Dg Abd 2 Views  06/27/2015   CLINICAL DATA:  Small bowel obstruction. Right upper quadrant abdominal and bilateral lower quadrant pain. Nausea and vomiting. History of peritoneal cancer.  EXAM: ABDOMEN - 2 VIEW  COMPARISON:  CT 06/26/2015.  Radiographs 06/25/2015.  FINDINGS: Nasogastric tube tip is in  the left upper quadrant of the abdomen consistent with position in the mid stomach. The bowel is decompressed. There is enteric contrast throughout the colon. No extravasated enteric contrast or suspicious abdominal calcification demonstrated. There is no free intraperitoneal air. Generalized increased abdominal density corresponding with known ascites. Mild degenerative changes are noted throughout the spine.  IMPRESSION: Nasogastric tube tip in the mid stomach. No evidence of residual bowel obstruction.   Electronically Signed   By: William  Veazey M.D.   On: 06/27/2015 09:32    ASSESSMENT/PLAN:    Small bowel obstruction Patient reports increased generalized abdominal discomfort, bloating, and feeling of fullness after only small amounts of fluids or  bites of food.  She's also complaining of increased nausea; and vomited one large amount earlier this morning; reporting that the vomitus was very foul smelling.  She has been constipated for the past several days; despite stool softeners and miralax.  She states that she is able to have a little liquid stool, occasionally only.  Patient denies any UTI symptoms whatsoever.  She denies any recent fevers or chills.  On exam - patient with decreased bowel sounds in all 4 quads.  Generalized abdomen with trace tenderness with palpation.  No rebound tenderness.  Does appear distended; with questionable ascites.  No flank pain bilaterally.  KUB obtained earlier today revealed: FINDINGS: Dilated small bowel loops in the left abdomen, suspicious for small-bowel obstruction. Upright view not obtained to evaluate for free air or air-fluid level. Colon decompressed.  Lumbar dextroscoliosis. No acute bony abnormality. Surgical clips the pelvis  IMPRESSION: Dilated small bowel loops suggestive of small bowel obstruction.  After reviewing all details and findings with Dr. Sherrill and Dr. Ennever (on call)- patient will be direct admitted to the  hospitalist for further evaluation and management.  Brief history and report were given to Dr.Samtani hospitalist prior to patient being transported to a telemetry bed per hospitalist orders.  Have also contacted Dr. Clark-Pearson, surgical   oncologist; to confirm that they will follow patient while admitted over the weekend. (Dr. Rossi out of town).    Uterine cancer Patient was receiving carboplatin/Taxol chemotherapy; but the Taxol portion of her chemotherapy was held with her last cycle of chemotherapy due to progressive neuropathy to her bilateral feet.  Patient's last carboplatin only chemotherapy was obtained on 05/27/2015.  Patient is scheduled for labs and a CT with contrast of the chest/abdomen/pelvis on 06/28/2015.  Patient is scheduled for a visit with GYN surgery Dr. Rossi on 07/09/2015.  Patient is scheduled for her initial radiation oncology consultation with Dr. Kinard on 07/14/2015.  Patient is scheduled to return for labs and a follow-up visit here at the cancer Center with Dr. Livesay on 07/15/2015.   Of note-have confirmed that Dr. Rossi is out of the country this week.  Dr. Clarke Pearson is on call for Dr. Rossi today.     Patient stated understanding of all instructions; and was in agreement with this plan of care. The patient knows to call the clinic with any problems, questions or concerns.   This was a shared visit with Dr. Sherrill today.  Also, reviewed all details with Dr. Ennever on-call physician for the cancer Center.  Total time spent with patient was 40 minutes;  with greater than 75 percent of that time spent in face to face counseling regarding patient's symptoms,  and coordination of care and follow up.  Disclaimer: This note was dictated with voice recognition software. Similar sounding words can inadvertently be transcribed and may not be corrected upon review.   Bacon, Cynthia, NP 06/28/2015    

## 2015-06-25 NOTE — Consult Note (Signed)
Gynecologic Oncology Consultation  Jessica Payne 64 y.o. 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  She also had a paracentesis revealing adenocarcinoma. She then underwent an exploratory laparotomy, TAH, BSO, omentectomy and complete tumor debulking surgery to R1 disease on 3/97/67 without complications. Intraoperative findings included 4 L of ascites, tumor studding (1 mm) over entire small bowel serosa/mesentery/diaphragm, plaque of tumor on the bladder peritoneum, tumor on the cul-de-sac peritoneum, enlarged right ovary, normal left ovary, rind of tumor on the anterior sigmoid colon, 10 cm omental cake from the hepatic flexure to the splenic flexure. It was a optimal cytoreductive effort with less than 1 cm disease at any residual location and residual tumor remaining with tumor studding on the small and large intestine and mesentery. Frozen section operatively revealed poorly differentiated cancer of both the endometrium and metastatic in the omentum. Her postoperative course was uncomplicated. 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 Primary peritoneal: Stage IIIC high grade serous involving omentum and peritoneal biopsies with metastatic foci on the left ovary.  Endometrial: incompletely staged, at least stage IB serous endometrial cancer with + lymphovascular space invasion, outer half myometrial invasion and a negative cervix. The lymph nodes were not sampled due to the extensive peritoneal tumor (these were not enlarged on preoperative imaging).   Post-operatively, it was recommended she receive six cycles of adjuvant carboplatin and taxol followed by adjuvant radiation including whole pelvic RT and vaginal brachytherapy due to the deeply invasive tumor of the endometrium.  She received dose dense carboplatin  taxol from 02-04-15 thru day 1 cycle 6 on 05-27-15, with taxol held cycle 6 due to peripheral neuropathy under the care of Dr. Evlyn Clines.  Last CA 125 on 05/27/15 was 173, up from 136 on 04/29/15.   Interval History:  She presented today to the [facility] symptom management clinic and was evaluated by [name], NP for abdominal discomfort, N/V, change in bowel movements.  She had an abdomen 1 view that resulted Dilated small bowel loops suggestive of small bowel obstruction.  She was then admitted to [facility] under the care of the Hospitalist Team.    She is seen at the bedside with her son and sister at the bedside.  She reports having nausea with emesis last weekend that resolved at the beginning of the week.  She states this am the nausea returned and was followed by a large amount of watery emesis.  She felt better after vomiting and has had no more nausea or emesis.  She reports abdominal discomfort in the lower abdominal and feels distended for the past week.  She is unsure if the distension is similar to the symptoms she felt when she was first diagnosed and having to have a paracentesis.  She has noticed a change in her bowel movements as well.  She has only experienced small amounts of loose stool after the use of miralax and colace. Her last normal bowel movement was last Thursday.  Her appetite has been diminished but she feels she has had adequate fluid intake.  Bladder functioning without difficulty.  No vaginal bleeding reported.      Review of Systems  Constitutional: No fever, chills, unintentional weight loss or gain.   Cardiovascular: Positive for shortness of breath when moderate physical activity/exertion.  No chest pain or edema.  Pulmonary: No cough  or wheeze.  Gastrointestinal: Positive for nausea this am with emesis.  Change in bowel movement for one week.  No bright red blood per rectum.  Positive for feelings of fullness/distention.  Genitourinary: No frequency, urgency, or  dysuria. No vaginal bleeding or discharge.  Musculoskeletal: No myalgia or joint pain. Neurologic: No weakness, numbness, or change in gait.  Psychology: No depression, anxiety, or insomnia.  Current Meds (inpt):  Current facility-administered medications:  .  dextrose 5% in lactated ringers with KCl 20 mEq/L infusion, , Intravenous, Continuous, Florencia Reasons, MD, Last Rate: 75 mL/hr at 06/25/15 1900 .  enoxaparin (LOVENOX) injection 40 mg, 40 mg, Subcutaneous, Q24H, Florencia Reasons, MD, 40 mg at 06/25/15 1832 .  metoprolol (LOPRESSOR) injection 2.5 mg, 2.5 mg, Intravenous, Q4H PRN, Florencia Reasons, MD .  morphine 2 MG/ML injection 1 mg, 1 mg, Intravenous, Q4H PRN, Florencia Reasons, MD .  ondansetron [facility]) injection 4 mg, 4 mg, Intravenous, Q8H PRN, Florencia Reasons, MD .  promethazine (PHENERGAN) injection 12.5 mg, 12.5 mg, Intravenous, Q6H PRN, Florencia Reasons, MD  Allergy:  Allergies  Allergen Reactions  . Ciprofloxacin Other (See Comments)    Mouth burned  . Merthiolate [Thimerosal] Hives  . Penicillins Hives  . Tramadol Nausea And Vomiting    Social Hx:   History   Social History  . Marital Status: Married    Spouse Name: N/A  . Number of Children: N/A  . Years of Education: N/A   Occupational History  . Not on file.   Social History Main Topics  . Smoking status: Former Smoker -- 1.00 packs/day for 20 years    Quit date: 08/20/2014  . Smokeless tobacco: Never Used  . Alcohol Use: No  . Drug Use: No  . Sexual Activity: Not Currently   Other Topics Concern  . Not on file   Social History Narrative    Past Surgical Hx:  Past Surgical History  Procedure Laterality Date  . Thyroidectomy    . Culposcopy     . Laparotomy N/A 01/12/2015    Procedure: EXPLORATORY LAPAROTOMY, RADICAL DEBULKING;  Surgeon: Everitt Amber, MD;  Location: [facility];  Service: Gynecology;  Laterality: N/A;  . Abdominal hysterectomy N/A 01/12/2015    Procedure: TOTAL HYSTERECTOMY ABDOMINAL;  Surgeon: Everitt Amber, MD;  Location: [facility];   Service: Gynecology;  Laterality: N/A;  . Salpingoophorectomy Bilateral 01/12/2015    Procedure: BILATERAL SALPINGO OOPHORECTOMY;  Surgeon: Everitt Amber, MD;  Location: [facility];  Service: Gynecology;  Laterality: Bilateral;  . Omentectomy N/A 01/12/2015    Procedure: OMENTECTOMY;  Surgeon: Everitt Amber, MD;  Location: [facility];  Service: Gynecology;  Laterality: N/A;    Past Medical Hx:  Past Medical History  Diagnosis Date  . Hypertension   . Acute peritonitis   . Ascites   . Low TSH level   . Hypothyroidism     1981   . Hyperthyroidism     03/2014   . GERD (gastroesophageal reflux disease)   . Headache     occasional migraine     Family Hx: History reviewed. No pertinent family history.  Vitals:  Blood pressure 137/67, pulse 97, temperature 98.4 F (36.9 C), temperature source Oral, resp. rate 18, height 5\' 7"  (1.702 m), weight 169 lb 6.4 oz (76.839 kg), SpO2 96 %.  Physical Exam:  General: Well developed, well nourished female in no acute distress. Alert and oriented x 3.  Cardiovascular: Regular rate and rhythm noted with premature beats intermittently.  No murmur, click, or  rub.  Lungs: Clear to auscultation bilaterally. No wheezes/crackles/rhonchi noted.  Skin: No rashes or lesions present. Back: No CVA tenderness.  Abdomen: Abdomen soft.  Tender with palpation of the lower quadrants.  Non-tympanic.  Active bowel sounds in the right upper quadrant, hypoactive in the LUQ, RLQ, and LLQ.  Mildly distended.  Mild firmness palpated around the umbilicus.  Midline incision well healed without evidence of herniation.    Extremities: No bilateral cyanosis, edema, or clubbing.   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  Currently admitted for evaluation and  management of SBO.  Dr. Marti Sleigh and Dr. Nancy Marus (on call at [facility] this weekend), GYN Oncologists, aware of the situation and agreeable with the current plan.  Orders and available labs reviewed.  Will continue to follow and see the patient in the am.  No concerns voiced from the patient or family at this time and verbalizing understanding of the current plan.      CROSS, [name], NP 06/25/2015, 5:51 PM

## 2015-06-25 NOTE — Assessment & Plan Note (Signed)
Patient was receiving carboplatin/Taxol chemotherapy; but the Taxol portion of her chemotherapy was held with her last cycle of chemotherapy due to progressive neuropathy to her bilateral feet.  Patient's last carboplatin only chemotherapy was obtained on 05/27/2015.  Patient is scheduled for labs and a CT with contrast of the chest/abdomen/pelvis on 06/28/2015.  Patient is scheduled for a visit with GYN surgery Dr. Denman George on 07/09/2015.  Patient is scheduled for her initial radiation oncology consultation with Dr. Sondra Come on 07/14/2015.  Patient is scheduled to return for labs and a follow-up visit here at the [facility] with Dr. Marko Plume on 07/15/2015.   Of note-have confirmed that Dr. Denman George is out of the country this week.  Dr. Fermin Schwab is on call for Dr. Denman George today.

## 2015-06-26 ENCOUNTER — Observation Stay (HOSPITAL_COMMUNITY): Payer: 59

## 2015-06-26 DIAGNOSIS — K5660 Unspecified intestinal obstruction: Secondary | ICD-10-CM | POA: Diagnosis present

## 2015-06-26 DIAGNOSIS — E039 Hypothyroidism, unspecified: Secondary | ICD-10-CM | POA: Diagnosis present

## 2015-06-26 DIAGNOSIS — I10 Essential (primary) hypertension: Secondary | ICD-10-CM | POA: Diagnosis present

## 2015-06-26 DIAGNOSIS — K5669 Other intestinal obstruction: Secondary | ICD-10-CM | POA: Diagnosis not present

## 2015-06-26 DIAGNOSIS — Z79899 Other long term (current) drug therapy: Secondary | ICD-10-CM | POA: Diagnosis not present

## 2015-06-26 DIAGNOSIS — E876 Hypokalemia: Secondary | ICD-10-CM | POA: Diagnosis present

## 2015-06-26 DIAGNOSIS — C801 Malignant (primary) neoplasm, unspecified: Secondary | ICD-10-CM | POA: Diagnosis not present

## 2015-06-26 DIAGNOSIS — C55 Malignant neoplasm of uterus, part unspecified: Secondary | ICD-10-CM | POA: Diagnosis present

## 2015-06-26 DIAGNOSIS — G629 Polyneuropathy, unspecified: Secondary | ICD-10-CM | POA: Diagnosis present

## 2015-06-26 DIAGNOSIS — X58XXXA Exposure to other specified factors, initial encounter: Secondary | ICD-10-CM | POA: Diagnosis present

## 2015-06-26 DIAGNOSIS — R180 Malignant ascites: Secondary | ICD-10-CM | POA: Diagnosis not present

## 2015-06-26 DIAGNOSIS — J449 Chronic obstructive pulmonary disease, unspecified: Secondary | ICD-10-CM | POA: Diagnosis present

## 2015-06-26 DIAGNOSIS — D6481 Anemia due to antineoplastic chemotherapy: Secondary | ICD-10-CM | POA: Diagnosis present

## 2015-06-26 DIAGNOSIS — T451X5A Adverse effect of antineoplastic and immunosuppressive drugs, initial encounter: Secondary | ICD-10-CM | POA: Diagnosis present

## 2015-06-26 DIAGNOSIS — Z79891 Long term (current) use of opiate analgesic: Secondary | ICD-10-CM | POA: Diagnosis not present

## 2015-06-26 DIAGNOSIS — C786 Secondary malignant neoplasm of retroperitoneum and peritoneum: Secondary | ICD-10-CM | POA: Diagnosis not present

## 2015-06-26 DIAGNOSIS — Z881 Allergy status to other antibiotic agents status: Secondary | ICD-10-CM | POA: Diagnosis not present

## 2015-06-26 DIAGNOSIS — E0590 Thyrotoxicosis, unspecified without thyrotoxic crisis or storm: Secondary | ICD-10-CM | POA: Diagnosis present

## 2015-06-26 DIAGNOSIS — Z87891 Personal history of nicotine dependence: Secondary | ICD-10-CM | POA: Diagnosis not present

## 2015-06-26 DIAGNOSIS — Z90710 Acquired absence of both cervix and uterus: Secondary | ICD-10-CM | POA: Diagnosis not present

## 2015-06-26 DIAGNOSIS — Z9109 Other allergy status, other than to drugs and biological substances: Secondary | ICD-10-CM | POA: Diagnosis not present

## 2015-06-26 DIAGNOSIS — K219 Gastro-esophageal reflux disease without esophagitis: Secondary | ICD-10-CM | POA: Diagnosis present

## 2015-06-26 DIAGNOSIS — Z880 Allergy status to penicillin: Secondary | ICD-10-CM | POA: Diagnosis not present

## 2015-06-26 DIAGNOSIS — Z791 Long term (current) use of non-steroidal anti-inflammatories (NSAID): Secondary | ICD-10-CM | POA: Diagnosis not present

## 2015-06-26 DIAGNOSIS — C482 Malignant neoplasm of peritoneum, unspecified: Secondary | ICD-10-CM | POA: Diagnosis not present

## 2015-06-26 DIAGNOSIS — Z885 Allergy status to narcotic agent status: Secondary | ICD-10-CM | POA: Diagnosis not present

## 2015-06-26 DIAGNOSIS — S025XXA Fracture of tooth (traumatic), initial encounter for closed fracture: Secondary | ICD-10-CM | POA: Diagnosis present

## 2015-06-26 DIAGNOSIS — C541 Malignant neoplasm of endometrium: Secondary | ICD-10-CM | POA: Diagnosis not present

## 2015-06-26 DIAGNOSIS — Z7952 Long term (current) use of systemic steroids: Secondary | ICD-10-CM | POA: Diagnosis not present

## 2015-06-26 DIAGNOSIS — R0602 Shortness of breath: Secondary | ICD-10-CM | POA: Diagnosis present

## 2015-06-26 DIAGNOSIS — Z9221 Personal history of antineoplastic chemotherapy: Secondary | ICD-10-CM | POA: Diagnosis not present

## 2015-06-26 DIAGNOSIS — R188 Other ascites: Secondary | ICD-10-CM | POA: Diagnosis not present

## 2015-06-26 LAB — CA 125: CA 125: 376.8 U/mL — AB (ref 0.0–38.1)

## 2015-06-26 LAB — CBC
HEMATOCRIT: 30.7 % — AB (ref 36.0–46.0)
HEMOGLOBIN: 10.2 g/dL — AB (ref 12.0–15.0)
MCH: 33.3 pg (ref 26.0–34.0)
MCHC: 33.2 g/dL (ref 30.0–36.0)
MCV: 100.3 fL — ABNORMAL HIGH (ref 78.0–100.0)
PLATELETS: 339 10*3/uL (ref 150–400)
RBC: 3.06 MIL/uL — ABNORMAL LOW (ref 3.87–5.11)
RDW: 14.5 % (ref 11.5–15.5)
WBC: 5.7 10*3/uL (ref 4.0–10.5)

## 2015-06-26 LAB — COMPREHENSIVE METABOLIC PANEL
ALT: 14 U/L (ref 14–54)
ANION GAP: 9 (ref 5–15)
AST: 18 U/L (ref 15–41)
Albumin: 3 g/dL — ABNORMAL LOW (ref 3.5–5.0)
Alkaline Phosphatase: 81 U/L (ref 38–126)
BUN: 10 mg/dL (ref 6–20)
CALCIUM: 8.7 mg/dL — AB (ref 8.9–10.3)
CO2: 30 mmol/L (ref 22–32)
CREATININE: 0.56 mg/dL (ref 0.44–1.00)
Chloride: 101 mmol/L (ref 101–111)
Glucose, Bld: 118 mg/dL — ABNORMAL HIGH (ref 65–99)
Potassium: 3.3 mmol/L — ABNORMAL LOW (ref 3.5–5.1)
Sodium: 140 mmol/L (ref 135–145)
TOTAL PROTEIN: 6.4 g/dL — AB (ref 6.5–8.1)
Total Bilirubin: 0.5 mg/dL (ref 0.3–1.2)

## 2015-06-26 LAB — TSH: TSH: <0.01 u[IU]/mL — ABNORMAL LOW (ref 0.350–4.500)

## 2015-06-26 LAB — PROTIME-INR
INR: 1.17 (ref 0.00–1.49)
Prothrombin Time: 15.1 seconds (ref 11.6–15.2)

## 2015-06-26 LAB — MAGNESIUM: Magnesium: 1.2 mg/dL — ABNORMAL LOW (ref 1.7–2.4)

## 2015-06-26 MED ORDER — DIPHENHYDRAMINE HCL 50 MG/ML IJ SOLN
12.5000 mg | Freq: Every evening | INTRAMUSCULAR | Status: DC | PRN
  Administered 2015-06-27: 12.5 mg via INTRAVENOUS
  Filled 2015-06-26: qty 1

## 2015-06-26 MED ORDER — POTASSIUM CHLORIDE 10 MEQ/100ML IV SOLN
10.0000 meq | INTRAVENOUS | Status: AC
Start: 2015-06-26 — End: 2015-06-26
  Administered 2015-06-26 (×4): 10 meq via INTRAVENOUS
  Filled 2015-06-26 (×4): qty 100

## 2015-06-26 MED ORDER — MAGNESIUM SULFATE 2 GM/50ML IV SOLN
2.0000 g | Freq: Once | INTRAVENOUS | Status: AC
  Administered 2015-06-26: 2 g via INTRAVENOUS
  Filled 2015-06-26: qty 50

## 2015-06-26 MED ORDER — IOHEXOL 300 MG/ML  SOLN
100.0000 mL | Freq: Once | INTRAMUSCULAR | Status: AC | PRN
  Administered 2015-06-26: 100 mL via INTRAVENOUS

## 2015-06-26 MED ORDER — IOHEXOL 300 MG/ML  SOLN
50.0000 mL | Freq: Once | INTRAMUSCULAR | Status: AC | PRN
  Administered 2015-06-26: 50 mL via ORAL

## 2015-06-26 NOTE — Progress Notes (Signed)
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 Currently admitted for evaluation and management of SBO.   Subjective: Patient reports no nausea or emesis this am.  Passing flatus with no bowel movement reported.  Lower abdominal discomfort has decreased.  Reporting throat discomfort related to NG tube.  "My tummy feels a little less full."  Ambulating without difficulty.  Patient not wanting to use narcotic pain medicine or IV pain medication because she does not want to slow her bowels per sister.  Stating she had not noticed the firmness around her lower abdomen.  Receiving oral contrast at this time via NG tube.  Sister at the bedside.  Denies chest pain, dyspnea.  Requesting benadryl to help her rest.  No other concerns/needs voiced.  Objective: Vital signs in last 24 hours: Temp:  [98.3 F (36.8 C)-98.4 F (36.9 C)] 98.3 F (36.8 C) (08/06 0537) Pulse Rate:  [89-103] 89 (08/06 0537) Resp:  [16-18] 18 (08/06 0537) BP: (137-151)/(67-84) 151/73 mmHg (08/06 0537) SpO2:  [95 %-96 %] 95 % (08/06 0537) Weight:  [169 lb 6.4 oz (76.839 kg)] 169 lb 6.4 oz (76.839 kg) (08/05 1524) Last BM Date: 06/25/15  Intake/Output from previous day: 08/05 0701 - 08/06 0700 In: 896.3 [I.V.:896.3] Out: 600 [Emesis/NG output:600]  Physical Examination: General: alert, cooperative and no distress Resp: clear to auscultation bilaterally Cardio: regular rate and rhythm, S1, S2 normal, no murmur, click, rub or gallop GI: abdomen soft except for mild firmness palpated in the lower abdomen, faint bowel sounds in upper quadrants, hypoactive in RLQ, non-tympanic on percussion, slightly distended Extremities: extremities normal, atraumatic, no cyanosis or  edema  Labs: WBC/Hgb/Hct/Plts:  5.7/10.2/30.7/339 (08/06 0406) BUN/Cr/glu/ALT/AST/amyl/lip:  10/0.56/--/14/18/--/-- (08/06 0406)  Assessment: 64 y.o. 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  Currently admitted for evaluation and treatment of SBO medically at this time. Pain:  Pain is well-controlled on PRN medications.  Heme: Hgb 10.2 and Hct 30.7 this am.    ID: No evidence of infection at this time. WBC 5.7.  CV: BP and HR stable.  Monitoring with ordered vital signs.  GI:  Currently admitted for SBO.  Abdomen 1 view on 06/25/15 resulting dilated small bowel loops suggestive of SBO.  CT AP ordered this am.  Tolerating po: No: NPO with NG tube in place.  600 cc output overnight.  GU: Adequate output reported.  FEN: K+ 3.3.  Replacement ordered.  Prophylaxis: Lovenox ordered.  Plan: CT AP this am per Dr. Marin Olp K+ replacement Kpad as needed Continue plan of care per Dr. Erlinda Hong GYN Oncology to follow   LOS: 1 day    CROSS, [name] 06/26/2015, 1:13 PM  Pager [phone]

## 2015-06-26 NOTE — Progress Notes (Addendum)
PROGRESS NOTE  Jessica Payne EUM:[id number] DOB: [birth year] DOA: 06/25/2015 PCP: No primary care provider on file.  HPI/Recap of past 24 hours:  600cc ng output last night, 250cc since 7am this morning Passing gas, no bm, no vomiting, sharp ab pain seems has resolved, feeling nauseous after oral contrast   Assessment/Plan: Active Problems:   SBO (small bowel obstruction)  SBO: bowel rest, ng intermittent suction. IVF, Prn analgesics/antiemetics. Talked to Dr. Fermin Schwab, recommended conservative management,state not need to consult general surgery, NP from gynonc will see patient this weekend. Oncology ordered CT ab,   5Pm Addendum: Ct ab showed improved sbo, but RN reported significant ng output, patient reported pass gas x2 today, no bm, less ab pain, discussed with patient , will repeat abx in am, possible clamp NG in am, trial of clears tomorrow.   H/o uterine cancer s/p debulking surgery in 11/2014, peritoneal carcinomatosis s/p chemotherapy, last early July. appreciated Oncology Dr. Marin Olp input.  Hypokalemia/hypomagnesemia: replace k/mg.  DVT prophylaxis: lovenox  Consultants:  Oncology Dr. Marin Olp  Gynecology oncology Dr. Fermin Schwab over the phone, he states NP Joylene John will see patient over the weekend, he states no need of general surgery consult.  Code Status: full   Family Communication: Patient , her sister and her son in room  Disposition Plan: remain inpatient    Antibiotics:  none   Objective: BP 151/73 mmHg  Pulse 89  Temp(Src) 98.3 F (36.8 C) (Oral)  Resp 18  Ht 5\' 7"  (1.702 m)  Wt 76.839 kg (169 lb 6.4 oz)  BMI 26.53 kg/m2  SpO2 95%  Intake/Output Summary (Last 24 hours) at 06/26/15 1101 Last data filed at 06/26/15 0821  Gross per 24 hour  Intake 896.25 ml  Output    750 ml  Net 146.25 ml   Filed Weights   06/25/15 1524  Weight: 76.839 kg (169 lb 6.4 oz)    Exam:   General:  NAD  Cardiovascular:  RRR  Respiratory: CTABL  Abdomen: Soft/ND/NT, positive BS  Musculoskeletal: No Edema  Neuro: aaox4  Data Reviewed: Basic Metabolic Panel:  Recent Labs Lab 06/25/15 1159 06/26/15 0406  NA 140 140  K 3.7 3.3*  CL  --  101  CO2 28 30  GLUCOSE 127 118*  BUN 8.2 10  CREATININE 0.7 0.56  CALCIUM 9.7 8.7*  MG  --  1.2*   Liver Function Tests:  Recent Labs Lab 06/25/15 1159 06/26/15 0406  AST 17 18  ALT 18 14  ALKPHOS 113 81  BILITOT 0.77 0.5  PROT 7.1 6.4*  ALBUMIN 3.3* 3.0*   No results for input(s): LIPASE, AMYLASE in the last 168 hours. No results for input(s): AMMONIA in the last 168 hours. CBC:  Recent Labs Lab 06/25/15 1159 06/26/15 0406  WBC 8.7 5.7  NEUTROABS 4.9  --   HGB 12.2 10.2*  HCT 35.4 30.7*  MCV 101.0 100.3*  PLT 400 339   Cardiac Enzymes:   No results for input(s): CKTOTAL, CKMB, CKMBINDEX, TROPONINI in the last 168 hours. BNP (last 3 results) No results for input(s): BNP in the last 8760 hours.  ProBNP (last 3 results) No results for input(s): PROBNP in the last 8760 hours.  CBG: No results for input(s): GLUCAP in the last 168 hours.  No results found for this or any previous visit (from the past 240 hour(s)).   Studies: Dg Abd 1 View  06/25/2015   CLINICAL DATA:  Peritoneal adenocarcinoma. Constipation. Uterine cancer.  EXAM: ABDOMEN - 1  VIEW  COMPARISON:  CT abdomen pelvis 12/15/2014  FINDINGS: Dilated small bowel loops in the left abdomen, suspicious for small-bowel obstruction. Upright view not obtained to evaluate for free air or air-fluid level. Colon decompressed.  Lumbar dextroscoliosis. No acute bony abnormality. Surgical clips the pelvis  IMPRESSION: Dilated small bowel loops suggestive of small bowel obstruction.   Electronically Signed   By: Franchot Gallo M.D.   On: 06/25/2015 12:38    Scheduled Meds: . enoxaparin (LOVENOX) injection  40 mg Subcutaneous Q24H  . potassium chloride  10 mEq Intravenous Q1 Hr x 4     Continuous Infusions: . dextrose 5% lactated ringers with KCl 20 mEq/L 75 mL/hr at 06/26/15 7340     Time spent: 54mins  Teller Wakefield MD, PhD  [facility] Pager [phone]. If 7PM-7AM, please contact night-coverage at [email or web address], password [facility] 06/26/2015, 11:01 AM  LOS: 1 day

## 2015-06-26 NOTE — Progress Notes (Signed)
Pt not able to tolerate IV K+ very well. Have had to decrease rate to 58ml/hr due to pt c/o burring at IV site. IV is intact. Running maintinence fluids with K+ as well to help decrease burning. Will continue to monitor pt's tolerance. It will take much longer than 4 hours for all ordered K+ to infuse.   Othella Boyer [facility] 06/26/2015

## 2015-06-27 ENCOUNTER — Inpatient Hospital Stay (HOSPITAL_COMMUNITY): Payer: 59

## 2015-06-27 DIAGNOSIS — R180 Malignant ascites: Secondary | ICD-10-CM | POA: Insufficient documentation

## 2015-06-27 LAB — CBC
HCT: 32.1 % — ABNORMAL LOW (ref 36.0–46.0)
HEMOGLOBIN: 10.6 g/dL — AB (ref 12.0–15.0)
MCH: 33.5 pg (ref 26.0–34.0)
MCHC: 33 g/dL (ref 30.0–36.0)
MCV: 101.6 fL — ABNORMAL HIGH (ref 78.0–100.0)
Platelets: 365 10*3/uL (ref 150–400)
RBC: 3.16 MIL/uL — ABNORMAL LOW (ref 3.87–5.11)
RDW: 14.5 % (ref 11.5–15.5)
WBC: 7.3 10*3/uL (ref 4.0–10.5)

## 2015-06-27 LAB — MAGNESIUM: Magnesium: 1.3 mg/dL — ABNORMAL LOW (ref 1.7–2.4)

## 2015-06-27 LAB — BASIC METABOLIC PANEL
ANION GAP: 9 (ref 5–15)
BUN: 6 mg/dL (ref 6–20)
CO2: 29 mmol/L (ref 22–32)
Calcium: 8.9 mg/dL (ref 8.9–10.3)
Chloride: 100 mmol/L — ABNORMAL LOW (ref 101–111)
Creatinine, Ser: 0.6 mg/dL (ref 0.44–1.00)
GFR calc Af Amer: >60 mL/min (ref >60)
Glucose, Bld: 115 mg/dL — ABNORMAL HIGH (ref 65–99)
Potassium: 3.8 mmol/L (ref 3.5–5.1)
SODIUM: 138 mmol/L (ref 135–145)

## 2015-06-27 MED ORDER — MAGNESIUM SULFATE 2 GM/50ML IV SOLN
2.0000 g | Freq: Once | INTRAVENOUS | Status: AC
  Administered 2015-06-27: 2 g via INTRAVENOUS
  Filled 2015-06-27: qty 50

## 2015-06-27 NOTE — Progress Notes (Signed)
I think that the CAT scan and the CA-125 tell us what is going on. I suspect that she has recurrence of her malignancy. Her CEA 125 is up to 378. On the CT scan, she has areas of peritoneal thickening. She has more ascites. I think she probably needs to have a paracentesis done.  She really wants to have the NG tube out. On the CT scan, there is no comment on obstruction. As such, I think that the NG tube can be clamped. She might be able to be started on clear liquids. If tolerated, then hopefully the NG tube can come out.  She's had a little bit of a cough. There is no bleeding. She's had no leg swelling.  Her abdomen is not painful. She has little bit of discomfort.  Again, I think a paracentesis would help her.  I appreciate the input from Joylene John, who is the nurse practitioner for GYN oncology.  I think the issue now is whether or not she needs to go back to surgery for another type of bulking or if "salvage" chemotherapy needs to be done. I would worry that she appears be progressing despite her recent treatment. I would have to think that her disease would have an element of resilience to it.  She sees me in pretty good health. As such, I would favor an aggressive approach if appropriate. I know that this might be an area of controversy.  Her physical exam is pretty much unchanged. Her temperature 97.7. Pulse 90. Blood pressure 160/79. Her lungs are clear. Cardiac exam regular rate and rhythm. Abdomen is slightly distended. Abdomen has decreased bowel sounds. There is no obvious mass. There is a fluid wave. Extremities shows no clubbing, cyanosis or edema.  For now, I think a paracentesis would not be a better idea. This may help with any type of bowel issue.  Again on the CT scan, it does not look like she has any obvious obstruction.  I think that Dr. Marko Plume will be back from vacation tomorrow. If not, I will continue to follow along.  We had good Fellowship today. Her faith  remains strong.  Pete E.  1 Thessalonians 5:16-18

## 2015-06-27 NOTE — Progress Notes (Signed)
NG tube removed per MD orders. Pt tolerated procedure well. Pt instructed on clear liquid diet and to take it slow. Instructed her to stop eating/drinking if she feels full/bloated/nauseated and to let RN know. Will continue to monitor pt's tolerance to liquids.

## 2015-06-27 NOTE — Progress Notes (Signed)
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 Currently admitted for evaluation and management of SBO.   Subjective: Patient reports no nausea or emesis this am.  Passing flatus with solid bowel movement reported this am.  Mild lower abdominal and RUQ discomfort remains but patient has not needed PRN medications.  Reporting feeling of fullness.  Reporting throat discomfort related to NG tube.  Ambulating without difficulty.  Sister, husband, and friend at the bedside.  Denies chest pain, dyspnea.  Asking about NG tube removal.  No other concerns/needs voiced.  Objective: Vital signs in last 24 hours: Temp:  [97.6 F (36.4 C)-98.3 F (36.8 C)] 97.7 F (36.5 C) (08/07 0524) Pulse Rate:  [90-93] 90 (08/07 0524) Resp:  [18-20] 18 (08/07 0524) BP: (134-160)/(74-80) 160/79 mmHg (08/07 0524) SpO2:  [92 %-96 %] 92 % (08/07 0524) Last BM Date: 06/25/15  Intake/Output from previous day: 08/06 0701 - 08/07 0700 In: 2253.8 [I.V.:1803.8; IV Piggyback:450] Out: 6962 [Urine:4; Emesis/NG output:1400]  Physical Examination: General: alert, cooperative and no distress Resp: clear to auscultation bilaterally Cardio: regular rate and rhythm, S1, S2 normal, no murmur, click, rub or gallop GI: abdominal firmness remains in the mid abdomen and is noted mildly in the upper abd this am, active bowel sounds in all quadrants, non-tympanic on percussion, slightly distended Extremities: extremities normal, atraumatic, no cyanosis or edema  Labs: WBC/Hgb/Hct/Plts:  7.3/10.6/32.1/365 (08/07 0518) BUN/Cr/glu/ALT/AST/amyl/lip:  6/0.60/--/--/--/--/-- (08/07 0518)  Assessment: 64 y.o. 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  Currently admitted for evaluation and treatment of SBO medically at this time. Pain:  Pain is well-controlled.  PRN medications ordered.  Heme: Hgb 10.6 and Hct 32.1 this am.    ID: No evidence of infection at this time. WBC 7.3.  CV: BP and HR stable.  Monitoring with ordered vital signs.  GI:  Currently admitted for SBO.  Abdomen 1 view on 06/25/15 resulting dilated small bowel loops suggestive of SBO.  CT AP ordered 06/26/15 resulting1. Increasing volume of mildly complex ascites, now with extensive peritoneal thickening and enhancement. While this may simply reflect changes from chronic peritonitis, the possibility of malignancy in the peritoneal cavity should be considered, and correlation with nonemergent paracentesis should be considered, if not recently performed. 2. Cholelithiasis without evidence of acute cholecystitis at this time.  3. Colonic diverticulosis without evidence of acute diverticulitis. 4. Extensive atherosclerosis, including fusiform ectasia of the infrarenal abdominal aorta which measures 2.8 x 2.9 cm.  5. Additional incidental findings, as above.   Tolerating po: No: NPO with NG tube in place.  Abdomen 2 view this am IMPRESSION:  Nasogastric tube tip in the mid stomach. No evidence of residual bowel obstruction.   GU: Adequate output reported.  FEN: Hypokalemia resolved.  Prophylaxis: Lovenox ordered.  Plan: Recommend NG removal Agree with Paracentesis for symptoms relief per Dr. Marin Olp Continue plan of care per Dr. Erlinda Hong GYN Oncology to follow  CT scan and CA 125 discussed.  All questions answered at this time.  Situation will be discussed with Dr. Everitt Amber and Dr. Evlyn Clines.   LOS: 2 days    CROSS, [name] 06/27/2015, 11:06 AM  Pager [phone]

## 2015-06-27 NOTE — Progress Notes (Signed)
PROGRESS NOTE  Jessica Payne QJF:[id number] DOB: Sep 20, 1951 DOA: 06/25/2015 PCP: No primary care provider on file.  HPI/Recap of past 24 hours:   Passing gas, had bm this am, ab pain resolved, want Ng tube out.   Assessment/Plan: Active Problems:   SBO (small bowel obstruction)  SBO:  talked to gyn onc Dr. Fermin Schwab, recommended conservative management,state not need to consult general surgery, NP from gynonc following Initially on bowel rest, ng intermittent suction. IVF, Prn analgesics/antiemetics. Resolved, repeat ab xray contrast through colon, no obstruction on 8/7, d/c ng, d/c ivf, start clears, ambulate   H/o uterine cancer s/p debulking surgery in 11/2014, peritoneal carcinomatosis s/p chemotherapy, last early July. appreciated Oncology Dr. Marin Olp input. Ct ab showed peritoneal fluids and progression of carcinomatosis, oncology ordered US guided paracentesis. Oncology and gyn onc input appreciated  Hypokalemia/hypomagnesemia: replace k/mg.  DVT prophylaxis: lovenox  Consultants:  Oncology Dr. Marin Olp  Gynecology oncology Dr. Fermin Schwab over the phone, he states NP Joylene John will see patient over the weekend, he states no need of general surgery consult.  Code Status: full   Family Communication: Patient , her sister and her son in room  Disposition Plan: possible d/c in am    Antibiotics:  none   Objective: BP 160/79 mmHg  Pulse 90  Temp(Src) 97.7 F (36.5 C) (Oral)  Resp 18  Ht 5\' 7"  (1.702 m)  Wt 76.839 kg (169 lb 6.4 oz)  BMI 26.53 kg/m2  SpO2 92%  Intake/Output Summary (Last 24 hours) at 06/27/15 1137 Last data filed at 06/27/15 0700  Gross per 24 hour  Intake 2203.75 ml  Output   1254 ml  Net 949.75 ml   Filed Weights   06/25/15 1524  Weight: 76.839 kg (169 lb 6.4 oz)    Exam:   General:  NAD  Cardiovascular: RRR  Respiratory: CTABL  Abdomen: Soft/ND/NT, positive BS  Musculoskeletal: No Edema  Neuro:  [id number]  Data Reviewed: Basic Metabolic Panel:  Recent Labs Lab 06/25/15 1159 06/26/15 0406 06/27/15 0518  NA 140 140 138  K 3.7 3.3* 3.8  CL  --  101 100*  CO2 28 30 29   GLUCOSE 127 118* 115*  BUN 8.2 10 6   CREATININE 0.7 0.56 0.60  CALCIUM 9.7 8.7* 8.9  MG  --  1.2* 1.3*   Liver Function Tests:  Recent Labs Lab 06/25/15 1159 06/26/15 0406  AST 17 18  ALT 18 14  ALKPHOS 113 81  BILITOT 0.77 0.5  PROT 7.1 6.4*  ALBUMIN 3.3* 3.0*   No results for input(s): LIPASE, AMYLASE in the last 168 hours. No results for input(s): AMMONIA in the last 168 hours. CBC:  Recent Labs Lab 06/25/15 1159 06/26/15 0406 06/27/15 0518  WBC 8.7 5.7 7.3  NEUTROABS 4.9  --   --   HGB 12.2 10.2* 10.6*  HCT 35.4 30.7* 32.1*  MCV 101.0 100.3* 101.6*  PLT 400 339 365   Cardiac Enzymes:   No results for input(s): CKTOTAL, CKMB, CKMBINDEX, TROPONINI in the last 168 hours. BNP (last 3 results) No results for input(s): BNP in the last 8760 hours.  ProBNP (last 3 results) No results for input(s): PROBNP in the last 8760 hours.  CBG: No results for input(s): GLUCAP in the last 168 hours.  No results found for this or any previous visit (from the past 240 hour(s)).   Studies: Dg Abd 2 Views  06/27/2015   CLINICAL DATA:  Small bowel obstruction. Right upper quadrant abdominal and bilateral lower  quadrant pain. Nausea and vomiting. History of peritoneal cancer.  EXAM: ABDOMEN - 2 VIEW  COMPARISON:  CT 06/26/2015.  Radiographs 06/25/2015.  FINDINGS: Nasogastric tube tip is in the left upper quadrant of the abdomen consistent with position in the mid stomach. The bowel is decompressed. There is enteric contrast throughout the colon. No extravasated enteric contrast or suspicious abdominal calcification demonstrated. There is no free intraperitoneal air. Generalized increased abdominal density corresponding with known ascites. Mild degenerative changes are noted throughout the spine.  IMPRESSION:  Nasogastric tube tip in the mid stomach. No evidence of residual bowel obstruction.   Electronically Signed   By: Richardean Sale M.D.   On: 06/27/2015 09:32    Scheduled Meds: . enoxaparin (LOVENOX) injection  40 mg Subcutaneous Q24H    Continuous Infusions:     Time spent: 53mins  Lille Karim MD, PhD  [facility] Pager [phone]. If 7PM-7AM, please contact night-coverage at [email or web address], password [facility] 06/27/2015, 11:37 AM  LOS: 2 days

## 2015-06-28 ENCOUNTER — Ambulatory Visit (HOSPITAL_COMMUNITY): Payer: 59

## 2015-06-28 ENCOUNTER — Other Ambulatory Visit: Payer: 59

## 2015-06-28 ENCOUNTER — Inpatient Hospital Stay (HOSPITAL_COMMUNITY): Payer: 59

## 2015-06-28 ENCOUNTER — Encounter: Payer: Self-pay | Admitting: Nurse Practitioner

## 2015-06-28 DIAGNOSIS — G620 Drug-induced polyneuropathy: Secondary | ICD-10-CM

## 2015-06-28 DIAGNOSIS — C482 Malignant neoplasm of peritoneum, unspecified: Secondary | ICD-10-CM

## 2015-06-28 DIAGNOSIS — D6481 Anemia due to antineoplastic chemotherapy: Secondary | ICD-10-CM

## 2015-06-28 DIAGNOSIS — D509 Iron deficiency anemia, unspecified: Secondary | ICD-10-CM

## 2015-06-28 DIAGNOSIS — C541 Malignant neoplasm of endometrium: Secondary | ICD-10-CM

## 2015-06-28 DIAGNOSIS — K0381 Cracked tooth: Secondary | ICD-10-CM

## 2015-06-28 DIAGNOSIS — K5660 Unspecified intestinal obstruction: Principal | ICD-10-CM

## 2015-06-28 LAB — BASIC METABOLIC PANEL
Anion gap: 7 (ref 5–15)
BUN: 9 mg/dL (ref 6–20)
CALCIUM: 8.9 mg/dL (ref 8.9–10.3)
CO2: 30 mmol/L (ref 22–32)
CREATININE: 0.61 mg/dL (ref 0.44–1.00)
Chloride: 100 mmol/L — ABNORMAL LOW (ref 101–111)
GFR calc Af Amer: >60 mL/min (ref >60)
Glucose, Bld: 90 mg/dL (ref 65–99)
Potassium: 3.7 mmol/L (ref 3.5–5.1)
Sodium: 137 mmol/L (ref 135–145)

## 2015-06-28 LAB — MAGNESIUM: MAGNESIUM: 1.5 mg/dL — AB (ref 1.7–2.4)

## 2015-06-28 LAB — CA 125: CA 125: 708 U/mL — AB (ref <35)

## 2015-06-28 MED ORDER — MAGNESIUM OXIDE 400 (241.3 MG) MG PO TABS: 400.0000 mg | ORAL_TABLET | Freq: Two times a day (BID) | ORAL | Status: AC

## 2015-06-28 MED ORDER — MAGNESIUM OXIDE 400 (241.3 MG) MG PO TABS
400.0000 mg | ORAL_TABLET | Freq: Two times a day (BID) | ORAL | Status: DC
  Administered 2015-06-28: 400 mg via ORAL
  Filled 2015-06-28: qty 1

## 2015-06-28 MED ORDER — BOOST / RESOURCE BREEZE PO LIQD: 1.0000 | Freq: Three times a day (TID) | ORAL | Status: DC

## 2015-06-28 MED ORDER — MAGNESIUM SULFATE 2 GM/50ML IV SOLN
2.0000 g | Freq: Once | INTRAVENOUS | Status: AC
  Administered 2015-06-28: 2 g via INTRAVENOUS
  Filled 2015-06-28: qty 50

## 2015-06-28 NOTE — Progress Notes (Addendum)
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 Currently admitted for evaluation and management of SBO.   Subjective: Patient reports tolerating clear liquids with no nausea or emesis this am.  Passing flatus with four bowel movements reported yesterday.  Mild lower abdominal and RUQ discomfort remains but patient has not needed PRN medications.  Reporting mild feeling of fullness.  Ambulating without difficulty.  No family at the bedside.  Denies chest pain, dyspnea.  No other concerns/needs voiced.  Objective: Vital signs in last 24 hours: Temp:  [97.5 F (36.4 C)-98.2 F (36.8 C)] 98.2 F (36.8 C) (08/08 0450) Pulse Rate:  [90-102] 94 (08/08 0450) Resp:  [18-20] 18 (08/08 0450) BP: (114-138)/(63-74) 138/68 mmHg (08/08 0946) SpO2:  [94 %-96 %] 94 % (08/08 0450) Last BM Date: 06/25/15  Intake/Output from previous day: 08/07 0701 - 08/08 0700 In: 615 [P.O.:240; I.V.:375] Out: 125 [Emesis/NG output:125]  Physical Examination: General: alert, cooperative and no distress Resp: clear to auscultation bilaterally Cardio: regular rate and rhythm, S1, S2 normal, no murmur, click, rub or gallop GI: abdominal firmness remains in the mid abdomen and is noted mildly in the upper abd this am, active bowel sounds in all quadrants, non-tympanic on percussion, slightly distended Extremities: extremities normal, atraumatic, no cyanosis or edema  Labs:   BUN/Cr/glu/ALT/AST/amyl/lip:  9/0.61/--/--/--/--/-- (08/08 0445)  Assessment: 64 y.o. 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  Currently admitted for evaluation and treatment of SBO medically  at this time. Pain:  Pain is well-controlled.  PRN medications ordered.  Heme: Hgb 10.6 and Hct 32.1 06/27/15 am.    ID: No evidence of infection at this time. WBC 7.3 on 06/27/15.  CV: BP and HR stable.  Monitoring with ordered vital signs.  GI:  Currently admitted for SBO.  Abdomen 1 view on 06/25/15 resulting dilated small bowel loops suggestive of SBO.  CT AP ordered 06/26/15 resulting1. Increasing volume of mildly complex ascites, now with extensive peritoneal thickening and enhancement. While this may simply reflect changes from chronic peritonitis, the possibility of malignancy in the peritoneal cavity should be considered, and correlation with nonemergent paracentesis should be considered, if not recently performed. 2. Cholelithiasis without evidence of acute cholecystitis at this time.  3. Colonic diverticulosis without evidence of acute diverticulitis. 4. Extensive atherosclerosis, including fusiform ectasia of the infrarenal abdominal aorta which measures 2.8 x 2.9 cm.  5. Additional incidental findings, as above.   Tolerating po: No: NPO with NG tube in place.  Abdomen 2 view 06/27/15 am IMPRESSION:  Nasogastric tube tip in the mid stomach. No evidence of residual bowel obstruction.   GU: Adequate output reported.  FEN: Hypokalemia resolved.  Prophylaxis: Lovenox ordered.  Plan: Full liquid diet per Dr. Delsa Sale US Paracentesis ordered Continue plan of care per Dr. Erlinda Hong GYN Oncology to follow   LOS: 3 days    Shandie Bertz DEAL 06/28/2015, 10:13 AM  Pager [phone]

## 2015-06-28 NOTE — Progress Notes (Addendum)
NUTRITION NOTE  Pt seen for low residue diet given recent SBO. Pt reports that she has trouble with her bowels and she is beginning to be afraid to eat due to this. She is on CLD and had chicken broth, jello, and grape juice for breakfast and denies any nausea or abdominal pain after intakes.  Pt reports that the last time she ate pasta she had a bowel movement 2 days later after excessive use of Miralax. She enjoys fresh fruits and vegetables, especially watermelon and cherries. She also eats chicken breast and pork.   Talked with pt about need to limit fiber and encouraged her to cook fruits and vegetables prior to consumption so that some of the fiber strands are softened and broken down before ingestion. Pt was surprised by this and felt that eating raw fruits and vegetables would be more helpful in moving bowels. Pt very appreciative of information and verbalizes understanding.  Expect good compliance.  ADDENDUM: Provided pt with Fiber-Restricted Nutrition Therapy handout from the [employer].   Jarome Matin, RD, LDN Inpatient Clinical Dietitian Pager # [phone] After hours/weekend pager # [phone]

## 2015-06-28 NOTE — Progress Notes (Signed)
Discharge information gone over with patient and friend, AVS summary given to patient.  All questions answered.  VSS.  Pt refused wheelchair and walked out with friend.

## 2015-06-28 NOTE — Discharge Summary (Signed)
Discharge Summary  Jessica Payne YTK:[id number] DOB: [birth year]  PCP: No primary care provider on file.  Admit date: 06/25/2015 Discharge date: 06/28/2015  Time spent: <77mins  Recommendations for Outpatient Follow-up:  1. F/u with oncology Dr. Marko Plume and gyn onco Dr. Denman George  Discharge Diagnoses:  Active Hospital Problems   Diagnosis Date Noted  . Ascites, malignant   . Hypomagnesemia   . SBO (small bowel obstruction) 06/25/2015    Resolved Hospital Problems   Diagnosis Date Noted Date Resolved  No resolved problems to display.    Discharge Condition: stable  Diet recommendation: heart healthy  Filed Weights   06/25/15 1524  Weight: 76.839 kg (169 lb 6.4 oz)    History of present illness:  Jessica Payne is a 64 y.o. female  H/o uterine cancer s/p debulking surgery in 11/2014, peritoneal carcinomatosis s/p chemotherapy, last early July, is sent from oncology clinic for direct admission due to SBO.  Patient reported she started to have intermittent sharp ab pain a week ago, ab pain usually start after meal, improved after vomiting, she reported still has BM daily, but a real good BM was a week ago, she vomited once the last 24hrs, she denies fever, denies hematemesis.  She was seen at oncology clinic today, ab xray showed SBO, hospitalist service called to admit the patient.   Hospital Course:  Active Problems:   SBO (small bowel obstruction)   Ascites, malignant   Hypomagnesemia  SBO:  talked to gyn onc Dr. Fermin Schwab, recommended conservative management,state not need to consult general surgery, NP from gynonc following Initially on bowel rest, ng intermittent suction. IVF, Prn analgesics/antiemetics. Resolved, repeat ab xray contrast through colon, no obstruction on 8/7, d/c ng, d/c ivf, start clears, ambulate Symptome resolved, tolerating diet advancement, discharge home with oncology and gyn onc follow up.   H/o uterine cancer s/p debulking surgery in  11/2014, peritoneal carcinomatosis s/p chemotherapy, last early July. appreciated Oncology Dr. Marin Olp input. Ct ab showed peritoneal fluids and progression of carcinomatosis, oncology ordered US guided paracentesis which is done on 8/8. Oncology and gyn onc input appreciated, elevated ca125, peritoneal fluids cytology pending, management defer to oncology.  Hypokalemia/hypomagnesemia: replace k/mg.  Persistent hypomagnesemia; required multiple dose of iv mag, discharged with oral mag supplement  Consultants:  Oncology Dr. Marin Olp , Dr Marko Plume Gynecology oncology Dr. Fermin Schwab over the phone, NP Joylene John   Code Status: full   Family Communication: Patient and family  Disposition Plan: d/c home  Procedures: US guided diagnostic/therapeutic paracentesis performed yielding 1.5 liters yellow fluid. A portion of the fluid was sent to the lab for cytology. No immediate complications. on 8/8        Discharge Exam: BP 122/73 mmHg  Pulse 94  Temp(Src) 98.2 F (36.8 C) (Oral)  Resp 18  Ht 5\' 7"  (1.702 m)  Wt 76.839 kg (169 lb 6.4 oz)  BMI 26.53 kg/m2  SpO2 94%   General: Pale, NAD  Eyes: PERRL  ENT: alopecia, unremarkable  Neck: supple, no JVD  Cardiovascular: RRR  Respiratory: CTABL  Abdomen: soft/ND/ND, positibe bowel sounds  Skin: no rash  Musculoskeletal: No edema  Psychiatric: calm/cooperative, pleasant  Neurologic: no focal findings    Discharge Instructions You were cared for by a hospitalist during your hospital stay. If you have any questions about your discharge medications or the care you received while you were in the hospital after you are discharged, you can call the unit and asked to speak with the hospitalist on call if the  hospitalist that took care of you is not available. Once you are discharged, your primary care physician will handle any further medical issues. Please note that NO REFILLS for any discharge medications will be  authorized once you are discharged, as it is imperative that you return to your primary care physician (or establish a relationship with a primary care physician if you do not have one) for your aftercare needs so that they can reassess your need for medications and monitor your lab values.      Discharge Instructions    Diet - low sodium heart healthy    Complete by:  As directed      Increase activity slowly    Complete by:  As directed             Medication List    STOP taking these medications        acyclovir 200 MG capsule  Commonly known as:  ZOVIRAX     benzonatate 100 MG capsule  Commonly known as:  TESSALON     dexamethasone 4 MG tablet  Commonly known as:  DECADRON     diphenhydrAMINE 25 mg capsule  Commonly known as:  BENADRYL     docusate sodium 100 MG capsule  Commonly known as:  COLACE     ferrous fumarate 325 (106 FE) MG Tabs tablet  Commonly known as:  HEMOCYTE     naproxen sodium 220 MG tablet  Commonly known as:  ANAPROX     ondansetron 8 MG tablet  Commonly known as:  ZOFRAN     sucralfate 1 G tablet  Commonly known as:  CARAFATE      TAKE these medications        feeding supplement Liqd  Take 1 Container by mouth 3 (three) times daily between meals.     loratadine 10 MG tablet  Commonly known as:  CLARITIN  Take 10 mg by mouth daily as needed for allergies.     LORazepam 1 MG tablet  Commonly known as:  ATIVAN  Place 1/2-1 tablet under the tongue or swallow every 6 hrs as needed for nausea.  Will make drowsy     magnesium oxide 400 (241.3 MG) MG tablet  Commonly known as:  MAG-OX  Take 1 tablet (400 mg total) by mouth 2 (two) times daily.     oxyCODONE 5 MG immediate release tablet  Commonly known as:  Oxy IR/ROXICODONE  Take 1 tablet (5 mg total) by mouth every 4 (four) hours as needed for moderate pain or breakthrough pain.     pantoprazole 40 MG tablet  Commonly known as:  PROTONIX  Take 1 tablet (40 mg total) by mouth  daily.     polyethylene glycol powder powder  Commonly known as:  GLYCOLAX/MIRALAX  Take 17 g by mouth as needed for severe constipation.     promethazine 25 MG suppository  Commonly known as:  PHENERGAN  Place 1 suppository (25 mg total) rectally every 6 (six) hours as needed for nausea or vomiting (NO NOT USE SUPPOSITORY IF  WHITE BLOOD COUNT IS LOW   Will Make drowsy).     sennosides-docusate sodium 8.6-50 MG tablet  Commonly known as:  SENOKOT-S  Take 1 tablet by mouth 2 (two) times daily as needed for constipation.     triamcinolone 0.1 % paste  Commonly known as:  KENALOG  Apply to tongue ulcer once daily at bedtime.       Allergies  Allergen Reactions  .  Ciprofloxacin Other (See Comments)    Mouth burned  . Merthiolate [Thimerosal] Hives  . Penicillins Hives  . Tramadol Nausea And Vomiting      The results of significant diagnostics from this hospitalization (including imaging, microbiology, ancillary and laboratory) are listed below for reference.    Significant Diagnostic Studies: Dg Abd 1 View  06/25/2015   CLINICAL DATA:  Peritoneal adenocarcinoma. Constipation. Uterine cancer.  EXAM: ABDOMEN - 1 VIEW  COMPARISON:  CT abdomen pelvis 12/15/2014  FINDINGS: Dilated small bowel loops in the left abdomen, suspicious for small-bowel obstruction. Upright view not obtained to evaluate for free air or air-fluid level. Colon decompressed.  Lumbar dextroscoliosis. No acute bony abnormality. Surgical clips the pelvis  IMPRESSION: Dilated small bowel loops suggestive of small bowel obstruction.   Electronically Signed   By: Franchot Gallo M.D.   On: 06/25/2015 12:38   Ct Abdomen Pelvis W Contrast  06/26/2015   CLINICAL DATA:  64 year old female with 1 week history of abdominal fullness. Decreased appetite. Vomiting.  EXAM: CT ABDOMEN AND PELVIS WITH CONTRAST  TECHNIQUE: Multidetector CT imaging of the abdomen and pelvis was performed using the standard protocol following bolus  administration of intravenous contrast.  CONTRAST:  17mL OMNIPAQUE IOHEXOL 300 MG/ML SOLN, 196mL OMNIPAQUE IOHEXOL 300 MG/ML SOLN  COMPARISON:  CT of the abdomen and pelvis 12/15/2014.  FINDINGS: Lower chest:  Unremarkable.  Hepatobiliary: 1.2 cm rim calcified gallstone in the gallbladder. No current findings to suggest an acute cholecystitis at this time. 1.5 cm low-attenuation lesion in segment 8 of the liver is compatible with a simple cyst. Other smaller low-attenuation liver lesions are too small to definitively characterize, but are similar to prior examination and also likely small cysts. No other suspicious appearing hepatic lesions. No intra or extrahepatic biliary ductal dilatation.  Pancreas: No pancreatic mass. No pancreatic ductal dilatation. No pancreatic or peripancreatic fluid or inflammatory changes.  Spleen: Unremarkable.  Adrenals/Urinary Tract: Bilateral adrenal glands and bilateral kidneys are normal in appearance. No hydroureteronephrosis. Urinary bladder is normal in appearance.  Stomach/Bowel: Nasogastric tube extending into the fundus of the stomach. Stomach is otherwise normal in appearance. No pathologic distention of small bowel or colon. Numerous colonic diverticulae, without definite surrounding inflammatory changes to suggest an acute diverticulitis at this time. Normal appendix.  Vascular/Lymphatic: Extensive atherosclerosis in the abdominal and pelvic vasculature, including fusiform ectasia of the infrarenal abdominal aorta which measures up to 2.8 x 2.9 cm. No lymphadenopathy noted in the abdomen or pelvis.  Reproductive: Status post hysterectomy. Ovaries are not confidently identified may be surgically absent or atrophic.  Other: Moderate to large volume of ascites which is increased compared to the prior study, and is now clearly associated with areas of peritoneal thickening and enhancement, as well as internal septations (image 41 of series 2 in the right side of the abdomen).  No pneumoperitoneum.  Musculoskeletal: Chronic appearing compression fracture of T12 with approximately 15-20% loss of anterior vertebral body height. Sclerotic lesions in the pelvis bilaterally, similar to prior study 12/15/2014, presumably bone islands. There are no aggressive appearing lytic or blastic lesions noted in the visualized portions of the skeleton.  IMPRESSION: 1. Increasing volume of mildly complex ascites, now with extensive peritoneal thickening and enhancement. While this may simply reflect changes from chronic peritonitis, the possibility of malignancy in the peritoneal cavity should be considered, and correlation with nonemergent paracentesis should be considered, if not recently performed. 2. Cholelithiasis without evidence of acute cholecystitis at this time. 3. Colonic diverticulosis  without evidence of acute diverticulitis. 4. Extensive atherosclerosis, including fusiform ectasia of the infrarenal abdominal aorta which measures 2.8 x 2.9 cm. 5. Additional incidental findings, as above.   Electronically Signed   By: Vinnie Langton M.D.   On: 06/26/2015 11:48   Dg Abd 2 Views  06/27/2015   CLINICAL DATA:  Small bowel obstruction. Right upper quadrant abdominal and bilateral lower quadrant pain. Nausea and vomiting. History of peritoneal cancer.  EXAM: ABDOMEN - 2 VIEW  COMPARISON:  CT 06/26/2015.  Radiographs 06/25/2015.  FINDINGS: Nasogastric tube tip is in the left upper quadrant of the abdomen consistent with position in the mid stomach. The bowel is decompressed. There is enteric contrast throughout the colon. No extravasated enteric contrast or suspicious abdominal calcification demonstrated. There is no free intraperitoneal air. Generalized increased abdominal density corresponding with known ascites. Mild degenerative changes are noted throughout the spine.  IMPRESSION: Nasogastric tube tip in the mid stomach. No evidence of residual bowel obstruction.   Electronically Signed   By:  Richardean Sale M.D.   On: 06/27/2015 09:32    Microbiology: No results found for this or any previous visit (from the past 240 hour(s)).   Labs: Basic Metabolic Panel:  Recent Labs Lab 06/25/15 1159 06/26/15 0406 06/27/15 0518 06/28/15 0445  NA 140 140 138 137  K 3.7 3.3* 3.8 3.7  CL  --  101 100* 100*  CO2 28 30 29 30   GLUCOSE 127 118* 115* 90  BUN 8.2 10 6 9   CREATININE 0.7 0.56 0.60 0.61  CALCIUM 9.7 8.7* 8.9 8.9  MG  --  1.2* 1.3* 1.5*   Liver Function Tests:  Recent Labs Lab 06/25/15 1159 06/26/15 0406  AST 17 18  ALT 18 14  ALKPHOS 113 81  BILITOT 0.77 0.5  PROT 7.1 6.4*  ALBUMIN 3.3* 3.0*   No results for input(s): LIPASE, AMYLASE in the last 168 hours. No results for input(s): AMMONIA in the last 168 hours. CBC:  Recent Labs Lab 06/25/15 1159 06/26/15 0406 06/27/15 0518  WBC 8.7 5.7 7.3  NEUTROABS 4.9  --   --   HGB 12.2 10.2* 10.6*  HCT 35.4 30.7* 32.1*  MCV 101.0 100.3* 101.6*  PLT 400 339 365   Cardiac Enzymes: No results for input(s): CKTOTAL, CKMB, CKMBINDEX, TROPONINI in the last 168 hours. BNP: BNP (last 3 results) No results for input(s): BNP in the last 8760 hours.  ProBNP (last 3 results) No results for input(s): PROBNP in the last 8760 hours.  CBG: No results for input(s): GLUCAP in the last 168 hours.     SignedFlorencia Reasons MD, PhD  [facility] 06/28/2015, 11:36 AM

## 2015-06-28 NOTE — Procedures (Signed)
US guided diagnostic/therapeutic paracentesis performed yielding 1.5 liters yellow fluid. A portion of the fluid was sent to the lab for cytology. No immediate complications.

## 2015-06-28 NOTE — Progress Notes (Signed)
MEDICAL ONCOLOGY June 28, 2015, 7:54 AM  Hospital day 4 Antibiotics: none Chemotherapy: last 05-27-2015 (day 1 cycle 6 dose dense carbo taxol, taxol held cycle 6 due to peripheral neuropathy)  Appreciate Dr Marin Olp seeing patient since admission; gyn oncology notes reveiwed also. EMR information reveiwed and Patient seen and examined, no family or visitors here now  Subjective: Feeling much better. Bowels moved with formed stools x 4 yesterday, none so far today. Has kept down clear liquids. No pain, has not used pain medication. No nausea, no vomiting. Has already walked in hall early AM.  For US paracentesis today, cytology requested.   Discussed clinical and radiographic SBO on admission, which has already improved/ resolved with bowel rest, this likely related to gyn malignancy still apparent on CT from admission, possibly also scar tissue from previous surgery.  Needs to gradually increase diet to full liquids then low residue; needs dietician instruction in low residue.     No PAC No genetics testing done  Pre-operative CA 125 1941 on 01-07-15  ONCOLOGIC HISTORY Patient had not had regular medical care since she lost job and medical insurance. She had progressive abdominal swelling over ~ 6 months as well as some postmenopausal bleeding when she was admitted to [facility] in 11-2014 with abdominal pain and presumed peritonitis, initially thought to be either gall bladder related or cirrhosis. Initial paracentesis 12-11-2014 was for 3.5 liters, with malignant cytology consistent with gyn malignancy. Notes refer to CT and MRI "which demonstrated omental thickening, extensive ascites, slight enlargement of the right adnexa and a thickened endometrium (1.1 cm)." She was referred to Dr Josephina Shih, seen on 12-25-14 with endometrial biopsy not possible due to cervical stenosis. She had second paracentesis for 4.2 liters on 01-04-15. CA 125 on 01-07-15 was 1941 (pre op).She had  TAH BSO, omentectomy and radical debulking by Dr Denman George on 01-12-15, with 4 liters of ascites at time of surgery. Findings at surgery were of milial tumor over entire small bowel serosa, mesentery, diaphragm, plaque on bladder and involving cul de sac peritoneum. Surgery was optimal R1 cytoreduction. Pathology [id number]) demonstrated high grade serous carcinoma, IIIC primary peritoneal and at least IB endometrial.. Recommendation was for 6 cycles of carboplatin and taxol, then likely whole pelvic RT + vaginal brachytherapy if she has CR. US paracentesis 01-29-15 for 1.5 liters. She began dose dense carboplatin taxol on 02-04-15. She completed day 1 cycle 6 carboplatin on 05-27-15 (taxol held with peripheral neuropathy). Admitted 06-25-15 with SBO, with peritoneal disease noted on CT.   Objective: Vital signs in last 24 hours: Blood pressure 114/67, pulse 94, temperature 98.2 F (36.8 C), temperature source Oral, resp. rate 18, height 5\' 7"  (1.702 m), weight 169 lb 6.4 oz (76.839 kg), SpO2 94 %.   Intake/Output from previous day: 08/07 0701 - 08/08 0700 In: 615 [P.O.:240; I.V.:375] Out: 125 [Emesis/NG output:125] Intake/Output this shift:    Physical exam: awake, alert, looks comfortable sitting on side of bed with clear liquid tray at bedside. Hair growing back. PERRL, not icteric. Oral mucosa moist. Lungs without wheezes or rales, respirations not labored RA. Peripheral IV capped left forearm, site ok. Heart RRR. Abdomen moderately distended, not tight, not tender, a few BS. LE no edema, cords, tenderness. Moves easily in bed, speech fluent and appropriate  Lab Results:  Recent Labs  06/26/15 0406 06/27/15 0518  WBC 5.7 7.3  HGB 10.2* 10.6*  HCT 30.7* 32.1*  PLT 339 365   BMET  Recent Labs  06/27/15  0518 06/28/15 0445  NA 138 137  K 3.8 3.7  CL 100* 100*  CO2 29 30  GLUCOSE 115* 90  BUN 6 9  CREATININE 0.60 0.61  CALCIUM 8.9 8.9  Magnesium still a little low at 1.5  CA 125 on  06-26-15 376, this having been 173 at cycle 6 carbo/taxol Cytology requested for ascites if paracentesis done today.  Studies/Results: Ct Abdomen Pelvis W Contrast  06/26/2015   CLINICAL DATA:  64 year old female with 1 week history of abdominal fullness. Decreased appetite. Vomiting.  EXAM: CT ABDOMEN AND PELVIS WITH CONTRAST  TECHNIQUE: Multidetector CT imaging of the abdomen and pelvis was performed using the standard protocol following bolus administration of intravenous contrast.  CONTRAST:  4mL OMNIPAQUE IOHEXOL 300 MG/ML SOLN, 123mL OMNIPAQUE IOHEXOL 300 MG/ML SOLN  COMPARISON:  CT of the abdomen and pelvis 12/15/2014.  FINDINGS: Lower chest:  Unremarkable.  Hepatobiliary: 1.2 cm rim calcified gallstone in the gallbladder. No current findings to suggest an acute cholecystitis at this time. 1.5 cm low-attenuation lesion in segment 8 of the liver is compatible with a simple cyst. Other smaller low-attenuation liver lesions are too small to definitively characterize, but are similar to prior examination and also likely small cysts. No other suspicious appearing hepatic lesions. No intra or extrahepatic biliary ductal dilatation.  Pancreas: No pancreatic mass. No pancreatic ductal dilatation. No pancreatic or peripancreatic fluid or inflammatory changes.  Spleen: Unremarkable.  Adrenals/Urinary Tract: Bilateral adrenal glands and bilateral kidneys are normal in appearance. No hydroureteronephrosis. Urinary bladder is normal in appearance.  Stomach/Bowel: Nasogastric tube extending into the fundus of the stomach. Stomach is otherwise normal in appearance. No pathologic distention of small bowel or colon. Numerous colonic diverticulae, without definite surrounding inflammatory changes to suggest an acute diverticulitis at this time. Normal appendix.  Vascular/Lymphatic: Extensive atherosclerosis in the abdominal and pelvic vasculature, including fusiform ectasia of the infrarenal abdominal aorta which measures  up to 2.8 x 2.9 cm. No lymphadenopathy noted in the abdomen or pelvis.  Reproductive: Status post hysterectomy. Ovaries are not confidently identified may be surgically absent or atrophic.  Other: Moderate to large volume of ascites which is increased compared to the prior study, and is now clearly associated with areas of peritoneal thickening and enhancement, as well as internal septations (image 41 of series 2 in the right side of the abdomen). No pneumoperitoneum.  Musculoskeletal: Chronic appearing compression fracture of T12 with approximately 15-20% loss of anterior vertebral body height. Sclerotic lesions in the pelvis bilaterally, similar to prior study 12/15/2014, presumably bone islands. There are no aggressive appearing lytic or blastic lesions noted in the visualized portions of the skeleton.  IMPRESSION: 1. Increasing volume of mildly complex ascites, now with extensive peritoneal thickening and enhancement. While this may simply reflect changes from chronic peritonitis, the possibility of malignancy in the peritoneal cavity should be considered, and correlation with nonemergent paracentesis should be considered, if not recently performed. 2. Cholelithiasis without evidence of acute cholecystitis at this time. 3. Colonic diverticulosis without evidence of acute diverticulitis. 4. Extensive atherosclerosis, including fusiform ectasia of the infrarenal abdominal aorta which measures 2.8 x 2.9 cm. 5. Additional incidental findings, as above.   Electronically Signed   By: Vinnie Langton M.D.   On: 06/26/2015 11:48   Dg Abd 2 Views  06/27/2015   CLINICAL DATA:  Small bowel obstruction. Right upper quadrant abdominal and bilateral lower quadrant pain. Nausea and vomiting. History of peritoneal cancer.  EXAM: ABDOMEN - 2 VIEW  COMPARISON:  CT 06/26/2015.  Radiographs 06/25/2015.  FINDINGS: Nasogastric tube tip is in the left upper quadrant of the abdomen consistent with position in the mid stomach. The  bowel is decompressed. There is enteric contrast throughout the colon. No extravasated enteric contrast or suspicious abdominal calcification demonstrated. There is no free intraperitoneal air. Generalized increased abdominal density corresponding with known ascites. Mild degenerative changes are noted throughout the spine.  IMPRESSION: Nasogastric tube tip in the mid stomach. No evidence of residual bowel obstruction.   Electronically Signed   By: Richardean Sale M.D.   On: 06/27/2015 09:32     Assessment/Plan: 1.SBO secondary to gyn malignancy: resolving with bowel rest on admission. Suggest gradually advancing diet to full liquids then low residue. Encouraged patient to walk and to avoid pain medication if not clearly needed. 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 CA 125 improved but not to normal after dose dense carbo taxol from 02-04-15 thru day 1 cycle 6 carbo only on 05-27-15. Residual/ progressive peritoneal disease by CT this admission with increase in CA 125. I will discuss with gyn oncology and expect to change to different outpatient chemo regimen. OK for US paracentesis, send fluid for cytology. 3. Difficult peripheral IV access: will get PAC placed as outpatient since access ok in hospital now  4.nutritional status: diet as above, suggest adding supplements 5.Hypo K corrected. Mg still low, would add po mag oxide 400 mg bid, which should also help bowels move. 6.long past tobacco, COPD 7. 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 Post partial thyroidectomy for goiter ~ 1980, on medication until ~ 1 year ago when stopped for financial reasons. TSH on admission <0.010. Per primary service. 10. Previous constipation  problems 11.broken tooth, which she was to address with dentist   Will follow with you. Please page if I can help between rounds  Pager [phone]  Volney Reierson P MD

## 2015-06-29 ENCOUNTER — Telehealth: Payer: Self-pay | Admitting: Oncology

## 2015-06-29 ENCOUNTER — Other Ambulatory Visit: Payer: Self-pay | Admitting: Oncology

## 2015-06-29 DIAGNOSIS — K56609 Unspecified intestinal obstruction, unspecified as to partial versus complete obstruction: Secondary | ICD-10-CM

## 2015-06-29 DIAGNOSIS — C541 Malignant neoplasm of endometrium: Secondary | ICD-10-CM

## 2015-06-29 DIAGNOSIS — C482 Malignant neoplasm of peritoneum, unspecified: Secondary | ICD-10-CM

## 2015-06-29 NOTE — Telephone Encounter (Signed)
Spoke with patient and she is aware of her 8/12 and 8/15 appointments

## 2015-06-30 ENCOUNTER — Other Ambulatory Visit: Payer: Self-pay | Admitting: Oncology

## 2015-06-30 DIAGNOSIS — C482 Malignant neoplasm of peritoneum, unspecified: Secondary | ICD-10-CM

## 2015-06-30 DIAGNOSIS — C541 Malignant neoplasm of endometrium: Secondary | ICD-10-CM

## 2015-07-02 ENCOUNTER — Encounter: Payer: Self-pay | Admitting: Oncology

## 2015-07-02 ENCOUNTER — Other Ambulatory Visit (HOSPITAL_BASED_OUTPATIENT_CLINIC_OR_DEPARTMENT_OTHER): Payer: 59

## 2015-07-02 ENCOUNTER — Telehealth: Payer: Self-pay | Admitting: Oncology

## 2015-07-02 ENCOUNTER — Ambulatory Visit (HOSPITAL_BASED_OUTPATIENT_CLINIC_OR_DEPARTMENT_OTHER): Payer: 59 | Admitting: Oncology

## 2015-07-02 VITALS — BP 133/84 | HR 101 | Temp 97.7°F | Resp 18 | Ht 67.0 in | Wt 163.8 lb

## 2015-07-02 DIAGNOSIS — C482 Malignant neoplasm of peritoneum, unspecified: Secondary | ICD-10-CM

## 2015-07-02 DIAGNOSIS — G620 Drug-induced polyneuropathy: Secondary | ICD-10-CM

## 2015-07-02 DIAGNOSIS — R180 Malignant ascites: Secondary | ICD-10-CM

## 2015-07-02 DIAGNOSIS — C541 Malignant neoplasm of endometrium: Secondary | ICD-10-CM

## 2015-07-02 DIAGNOSIS — T451X5A Adverse effect of antineoplastic and immunosuppressive drugs, initial encounter: Secondary | ICD-10-CM

## 2015-07-02 DIAGNOSIS — K56609 Unspecified intestinal obstruction, unspecified as to partial versus complete obstruction: Secondary | ICD-10-CM

## 2015-07-02 DIAGNOSIS — D6481 Anemia due to antineoplastic chemotherapy: Secondary | ICD-10-CM

## 2015-07-02 LAB — CBC WITH DIFFERENTIAL/PLATELET
BASO%: 0.3 % (ref 0.0–2.0)
Basophils Absolute: 0 10*3/uL (ref 0.0–0.1)
EOS%: 3 % (ref 0.0–7.0)
Eosinophils Absolute: 0.2 10*3/uL (ref 0.0–0.5)
HCT: 34 % — ABNORMAL LOW (ref 34.8–46.6)
HGB: 11.5 g/dL — ABNORMAL LOW (ref 11.6–15.9)
LYMPH#: 2.4 10*3/uL (ref 0.9–3.3)
LYMPH%: 31 % (ref 14.0–49.7)
MCH: 33.4 pg (ref 25.1–34.0)
MCHC: 33.8 g/dL (ref 31.5–36.0)
MCV: 98.8 fL (ref 79.5–101.0)
MONO#: 0.9 10*3/uL (ref 0.1–0.9)
MONO%: 11.8 % (ref 0.0–14.0)
NEUT#: 4.2 10*3/uL (ref 1.5–6.5)
NEUT%: 53.9 % (ref 38.4–76.8)
PLATELETS: 360 10*3/uL (ref 145–400)
RBC: 3.44 10*6/uL — ABNORMAL LOW (ref 3.70–5.45)
RDW: 14.4 % (ref 11.2–14.5)
WBC: 7.7 10*3/uL (ref 3.9–10.3)

## 2015-07-02 LAB — COMPREHENSIVE METABOLIC PANEL (CC13)
ALT: 14 U/L (ref 0–55)
ANION GAP: 10 meq/L (ref 3–11)
AST: 13 U/L (ref 5–34)
Albumin: 3.1 g/dL — ABNORMAL LOW (ref 3.5–5.0)
Alkaline Phosphatase: 101 U/L (ref 40–150)
BUN: 11.3 mg/dL (ref 7.0–26.0)
CHLORIDE: 97 meq/L — AB (ref 98–109)
CO2: 29 meq/L (ref 22–29)
CREATININE: 0.8 mg/dL (ref 0.6–1.1)
Calcium: 9 mg/dL (ref 8.4–10.4)
EGFR: 80 mL/min/{1.73_m2} — AB (ref >90)
GLUCOSE: 127 mg/dL (ref 70–140)
Potassium: 3.5 mEq/L (ref 3.5–5.1)
Sodium: 136 mEq/L (ref 136–145)
Total Bilirubin: 0.31 mg/dL (ref 0.20–1.20)
Total Protein: 6.5 g/dL (ref 6.4–8.3)

## 2015-07-02 LAB — MAGNESIUM (CC13): Magnesium: 1.5 mg/dl (ref 1.5–2.5)

## 2015-07-02 MED ORDER — LIDOCAINE-PRILOCAINE 2.5-2.5 % EX CREA: TOPICAL_CREAM | CUTANEOUS | Status: AC

## 2015-07-02 NOTE — Telephone Encounter (Signed)
Gave avs & calendar for Augsut/September. Sent for ECHO authorization.

## 2015-07-02 NOTE — Progress Notes (Signed)
OFFICE PROGRESS NOTE   July 02, 2015   Physicians:Emma Glade Stanford (PCP)  INTERVAL HISTORY:   Patient is seen, alone for visit, in follow up of recent hospitalization for SBO, unfortunately with progressive disease within 4 weeks of initial carboplatin taxol. The SBO improved with conservative interventions, CT AP showed extensive ascites and peritoneal progression and cytology confirmed malignant ascites.  Patient had dose dense carbo taxol from 02-04-15 thru day 1 cycle 6 on 05-27-15 (taxol held cycle 6 with peripheral neuropathy). CA 125 did not normalize, this down to 136 on 04-29-15, then 173 on 05-27-15 and 376 on 06-26-15. She did not report 12 hours of nausea and vomiting end of July, then was hospitalized 8-5 thru 06-28-15 with SBO. She was tolerating liquids at DC and was more comfortable after paracentesis 06-28-15 of 1.5 liters ascites. She had some instruction on low fiber/low residue diet in hospital, which we have reveiwed in detail now. She was DC home on 06-28-15, then had 5 hours of N/V on 06-30-15 after eating okra,tomatoes and corn. She has tolerated liquids and has had no abdominal pain or vomiting in past 36 hrs. Last bowel movement was 06-30-15. She has had no fever, is voiding, denies increased SOB. Peripheral neuropathy is a little better in hands and less bothersome in feet when she is active.  She is in agreement with PAC now No genetics testing done  Pre-operative CA 125 1941 on 01-07-15   ONCOLOGIC HISTORY Patient had not had regular medical care since she lost job and medical insurance. She had progressive abdominal swelling over ~ 6 months as well as some postmenopausal bleeding when she was admitted to [facility] in 11-2014 with abdominal pain and presumed peritonitis, initially thought to be either gall bladder related or cirrhosis. Initial paracentesis 12-11-2014 was for 3.5 liters, with malignant cytology consistent with gyn malignancy. Notes refer  to CT and MRI "which demonstrated omental thickening, extensive ascites, slight enlargement of the right adnexa and a thickened endometrium (1.1 cm)." She was referred to Dr Josephina Shih, seen on 12-25-14 with endometrial biopsy not possible due to cervical stenosis. She had second paracentesis for 4.2 liters on 01-04-15. CA 125 on 01-07-15 was 1941 (pre op).She had TAH BSO, omentectomy and radical debulking by Dr Denman George on 01-12-15, with 4 liters of ascites at time of surgery. Findings at surgery were of milial tumor over entire small bowel serosa, mesentery, diaphragm, plaque on bladder and involving cul de sac peritoneum. Surgery was optimal R1 cytoreduction. Pathology [id number]) demonstrated high grade serous carcinoma, IIIC primary peritoneal and at least IB endometrial.. Recommendation was for 6 cycles of carboplatin and taxol, then likely whole pelvic RT + vaginal brachytherapy if she has CR. US paracentesis 01-29-15 for 1.5 liters. She began dose dense carboplatin taxol on 02-04-15, given thru day 1 cycle 6 on 05-27-15 (taxol held cycle 6 due to peripheral neuropathy).     Review of systems as above, also: No fever or symptoms of infection. No LE swelling Remainder of 10 point Review of Systems negative.  Objective:  Vital signs in last 24 hours:  BP 133/84 mmHg  Pulse 101  Temp(Src) 97.7 F (36.5 C) (Oral)  Resp 18  Ht _0  (1.702 m)  Wt 163 lb 12.8 oz (74.299 kg)  BMI 25.65 kg/m2 Weight down 6 lbs. Respirations not labored RA. Looks comfortable Alert, oriented and appropriate. Ambulatory without difficulty.  Hair is growing back.  HEENT:PERRL, sclerae not icteric. Oral mucosa moist without lesions,  posterior pharynx clear.  Neck supple. No JVD.  Lymphatics:no cervical,supraclavicular, or inguinal adenopathy Resp: diminished BS thruout as baseline, otherwise clear to auscultation bilaterally and no dullness to percussion bilaterally Cardio: regular rate and rhythm. No gallop. GI:  soft, nontender, less distended, no mass or organomegaly. Some normal bowel sounds. Surgical incision not remarkable. Musculoskeletal/ Extremities: without pitting edema, cords, tenderness Neuro: peripheral neuropathy fingertips and feet unchanged. Otherwise nonfocal. PSYCH appropriate mood and affect Skin without rash, petechiae. Resolving ecchymoses UE at sites of venous access attempts.   Lab Results:  Results for orders placed or performed in visit on 07/02/15  CBC with Differential  Result Value Ref Range   WBC 7.7 3.9 - 10.3 10e3/uL   NEUT# 4.2 1.5 - 6.5 10e3/uL   HGB 11.5 (L) 11.6 - 15.9 g/dL   HCT 34.0 (L) 34.8 - 46.6 %   Platelets 360 145 - 400 10e3/uL   MCV 98.8 79.5 - 101.0 fL   MCH 33.4 25.1 - 34.0 pg   MCHC 33.8 31.5 - 36.0 g/dL   RBC 3.44 (L) 3.70 - 5.45 10e6/uL   RDW 14.4 11.2 - 14.5 %   lymph# 2.4 0.9 - 3.3 10e3/uL   MONO# 0.9 0.1 - 0.9 10e3/uL   Eosinophils Absolute 0.2 0.0 - 0.5 10e3/uL   Basophils Absolute 0.0 0.0 - 0.1 10e3/uL   NEUT% 53.9 38.4 - 76.8 %   LYMPH% 31.0 14.0 - 49.7 %   MONO% 11.8 0.0 - 14.0 %   EOS% 3.0 0.0 - 7.0 %   BASO% 0.3 0.0 - 2.0 %  Comprehensive metabolic panel (Cmet) - [facility]  Result Value Ref Range   Sodium 136 136 - 145 mEq/L   Potassium 3.5 3.5 - 5.1 mEq/L   Chloride 97 (L) 98 - 109 mEq/L   CO2 29 22 - 29 mEq/L   Glucose 127 70 - 140 mg/dl   BUN 11.3 7.0 - 26.0 mg/dL   Creatinine 0.8 0.6 - 1.1 mg/dL   Total Bilirubin 0.31 0.20 - 1.20 mg/dL   Alkaline Phosphatase 101 40 - 150 U/L   AST 13 5 - 34 U/L   ALT 14 0 - 55 U/L   Total Protein 6.5 6.4 - 8.3 g/dL   Albumin 3.1 (L) 3.5 - 5.0 g/dL   Calcium 9.0 8.4 - 10.4 mg/dL   Anion Gap 10 3 - 11 mEq/L   EGFR 80 (L) >90 ml/min/1.73 m2  Magnesium  Result Value Ref Range   Magnesium 1.5 1.5 - 2.5 mg/dl     Studies/Results:  CT ABDOMEN AND PELVIS WITH CONTRAST  TECHNIQUE: Multidetector CT imaging of the abdomen and pelvis was performed using the standard protocol following  bolus administration of intravenous contrast.  CONTRAST: 45m OMNIPAQUE IOHEXOL 300 MG/ML SOLN, 1038mOMNIPAQUE IOHEXOL 300 MG/ML SOLN  COMPARISON: CT of the abdomen and pelvis 12/15/2014.  FINDINGS: Lower chest: Unremarkable.  Hepatobiliary: 1.2 cm rim calcified gallstone in the gallbladder. No current findings to suggest an acute cholecystitis at this time. 1.5 cm low-attenuation lesion in segment 8 of the liver is compatible with a simple cyst. Other smaller low-attenuation liver lesions are too small to definitively characterize, but are similar to prior examination and also likely small cysts. No other suspicious appearing hepatic lesions. No intra or extrahepatic biliary ductal dilatation.  Pancreas: No pancreatic mass. No pancreatic ductal dilatation. No pancreatic or peripancreatic fluid or inflammatory changes.  Spleen: Unremarkable.  Adrenals/Urinary Tract: Bilateral adrenal glands and bilateral kidneys are normal in appearance.  No hydroureteronephrosis. Urinary bladder is normal in appearance.  Stomach/Bowel: Nasogastric tube extending into the fundus of the stomach. Stomach is otherwise normal in appearance. No pathologic distention of small bowel or colon. Numerous colonic diverticulae, without definite surrounding inflammatory changes to suggest an acute diverticulitis at this time. Normal appendix.  Vascular/Lymphatic: Extensive atherosclerosis in the abdominal and pelvic vasculature, including fusiform ectasia of the infrarenal abdominal aorta which measures up to 2.8 x 2.9 cm. No lymphadenopathy noted in the abdomen or pelvis.  Reproductive: Status post hysterectomy. Ovaries are not confidently identified may be surgically absent or atrophic.  Other: Moderate to large volume of ascites which is increased compared to the prior study, and is now clearly associated with areas of peritoneal thickening and enhancement, as well as  internal septations (image 41 of series 2 in the right side of the abdomen). No pneumoperitoneum.  Musculoskeletal: Chronic appearing compression fracture of T12 with approximately 15-20% loss of anterior vertebral body height. Sclerotic lesions in the pelvis bilaterally, similar to prior study 12/15/2014, presumably bone islands. There are no aggressive appearing lytic or blastic lesions noted in the visualized portions of the skeleton.  IMPRESSION: 1. Increasing volume of mildly complex ascites, now with extensive peritoneal thickening and enhancement. While this may simply reflect changes from chronic peritonitis, the possibility of malignancy in the peritoneal cavity should be considered, and correlation with nonemergent paracentesis should be considered, if not recently performed. 2. Cholelithiasis without evidence of acute cholecystitis at this time. 3. Colonic diverticulosis without evidence of acute diverticulitis. 4. Extensive atherosclerosis, including fusiform ectasia of the infrarenal abdominal aorta which measures 2.8 x 2.9 cm. 5. Additional incidental findings, as above.  PACs images of CT and of abdominal films showing SBO reviewed with patient now.   CYTOLOGY Kosiba, Lexany Collected: 06/28/2015 Client: [facility] Accession: [id number] Received: 06/28/2015 Rowe Robert, PA-CLOGY REPORT Adequacy Reason Satisfactory For Evaluation. Diagnosis PERITONEAL/ASCITIC FLUID(SPECIMEN 1 OF 1 COLLECTED 06/28/15): MALIGNANT CELLS CONSISTENT WITH METASTATIC ADENOCARCINOMA.   Medications: I have reviewed the patient's current medications. Continue magnesium. Resume protonix.   DISCUSSION: Reviewed situation now. She understands that the gyn cancer has progressed on first line taxol and carboplatin, which generally indicates aggressive disease with poor prognosis. I have explained that systemic treatment is best option at this point, as surgery cannot improve overall  situation and local radiation is not indicated now. I have told her that treatment will be in attempt to control disease and control symptoms, but will not be curative. She understands that there are some FDA approved treatments for platinum taxane resistant/ refractory disease, and some other drugs that also may be used. With the SBO, she is not appropriate for avastin. We have discussed doxil, including administration and side effects,  and she has had teaching for this also by RN. We will need to confirm cardiac function by echo and she will need PAC prior to start of Doxil. She has had chemo on Thursdays previously, but may be able to start sooner depending on echo, PAC and preauthorization. She will keep appointment with Dr Denman George on 07-09-15; appointment with Dr Sondra Come cancelled.   She is to meet with [place] on 07-05-15. We have given written information on low residue diet, but will keep diet to soups, liquids, [employer], yogurt, ice cream over weekend.  Verbal consent obtained for doxil.  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 Progressed around completion of adjuvant carbo taxol. If EF ok will begin doxil ~ 07-12-15. Could repeat paracentesis if symptomatic. 2.SBO during recent hospitalization and self-limited symptoms before and since the hospitalization: careful diet choices, change chemo. 3. Difficult peripheral IV access: needs PAC by IR, hopefully next week  4.nutritional status: may need supplements for now, as likely will need basically full liquid or very soft diet for now 5.Hypo K corrected. Continues po magnesium. 6.long past tobacco, COPD 7. Iron deficiency + chemo anemia: post IV feraheme. Improved since chemo break. 8.taxol peripheral neuropathy in  feet and fingers: some better 9. Post partial thyroidectomy for goiter ~ 1980, on medication until ~ 1 year ago when stopped for financial reasons. TSH on admission <0.010. I will let PCP know. 10. Previous constipation problems  TIme spent 40 min including >50% counseling and coordination of care. Chemo orders placed, preauthorization requested. CC Drs Denman George and Philip Aspen.  LIVESAY,LENNIS P, MD   07/02/2015, 11:42 AM

## 2015-07-05 ENCOUNTER — Ambulatory Visit (HOSPITAL_COMMUNITY)
Admission: RE | Admit: 2015-07-05 | Discharge: 2015-07-05 | Disposition: A | Discharge status: Home / Self Care | Payer: 59 | Source: Ambulatory Visit | Attending: Oncology | Admitting: Oncology

## 2015-07-05 ENCOUNTER — Ambulatory Visit: Payer: 59 | Admitting: Nutrition

## 2015-07-05 DIAGNOSIS — K219 Gastro-esophageal reflux disease without esophagitis: Secondary | ICD-10-CM | POA: Diagnosis not present

## 2015-07-05 DIAGNOSIS — C541 Malignant neoplasm of endometrium: Secondary | ICD-10-CM | POA: Diagnosis not present

## 2015-07-05 DIAGNOSIS — Z87891 Personal history of nicotine dependence: Secondary | ICD-10-CM | POA: Diagnosis not present

## 2015-07-05 DIAGNOSIS — C482 Malignant neoplasm of peritoneum, unspecified: Secondary | ICD-10-CM | POA: Insufficient documentation

## 2015-07-05 NOTE — Progress Notes (Signed)
Nutrition follow-up completed with patient who has been diagnosed with peritoneal carcinomatosis. Patient reports she has been following a low residue, low fiber diet since small bowel obstruction. States she never really knows how her body is going to react food. No recent weight documented.  Nutrition diagnosis:  Food and nutrition related knowledge deficit related to peritoneal carcinomatosis and history of small bowel obstruction as evidenced by no prior need for nutrition related information.  Intervention: Reviewed low fiber, low residue diet with patient who appears to have good understanding of restrictions. Provided patient with a copy of a full liquid diet for additional foods she may tolerate. Also provided patient with oral nutrition supplements to provide additional calories and protein. Educated patient on strategies for improving taste of oral nutrition supplements. Questions were answered.  Teach back method used.  Monitoring, evaluation, goals: Patient will tolerate low residue diet for adequate calories and protein.  Next visit: Monday, September 19, during infusion.  **Disclaimer: This note was dictated with voice recognition software. Similar sounding words can inadvertently be transcribed and this note may contain transcription errors which may not have been corrected upon publication of note.**

## 2015-07-05 NOTE — Progress Notes (Signed)
*  PRELIMINARY RESULTS* Echocardiogram 2D Echocardiogram has been performed.  Leavy Cella 07/05/2015, 10:16 AM

## 2015-07-06 ENCOUNTER — Other Ambulatory Visit: Payer: Self-pay | Admitting: Oncology

## 2015-07-07 ENCOUNTER — Other Ambulatory Visit: Payer: Self-pay | Admitting: Radiology

## 2015-07-07 ENCOUNTER — Telehealth: Payer: Self-pay

## 2015-07-07 NOTE — Telephone Encounter (Signed)
-----   Message from Gordy Levan, MD sent at 07/06/2015  6:07 PM EDT ----- Labs seen and need follow up: please let her know echocardiogram fine for doxil as planned. See message re medication also.

## 2015-07-07 NOTE — Telephone Encounter (Signed)
Told Ms. Dearden the results of the echocardiogram as noted below by Dr. Marko Plume.

## 2015-07-08 ENCOUNTER — Other Ambulatory Visit: Payer: Self-pay | Admitting: Oncology

## 2015-07-08 ENCOUNTER — Ambulatory Visit (HOSPITAL_COMMUNITY)
Admission: RE | Admit: 2015-07-08 | Discharge: 2015-07-08 | Disposition: A | Discharge status: Home / Self Care | Payer: 59 | Source: Ambulatory Visit | Attending: Oncology | Admitting: Oncology

## 2015-07-08 ENCOUNTER — Encounter (HOSPITAL_COMMUNITY): Payer: Self-pay

## 2015-07-08 DIAGNOSIS — I10 Essential (primary) hypertension: Secondary | ICD-10-CM | POA: Diagnosis not present

## 2015-07-08 DIAGNOSIS — Z87891 Personal history of nicotine dependence: Secondary | ICD-10-CM | POA: Diagnosis not present

## 2015-07-08 DIAGNOSIS — C482 Malignant neoplasm of peritoneum, unspecified: Secondary | ICD-10-CM

## 2015-07-08 DIAGNOSIS — C541 Malignant neoplasm of endometrium: Secondary | ICD-10-CM | POA: Insufficient documentation

## 2015-07-08 DIAGNOSIS — K219 Gastro-esophageal reflux disease without esophagitis: Secondary | ICD-10-CM | POA: Diagnosis not present

## 2015-07-08 DIAGNOSIS — G43909 Migraine, unspecified, not intractable, without status migrainosus: Secondary | ICD-10-CM | POA: Insufficient documentation

## 2015-07-08 LAB — CBC
HCT: 32.4 % — ABNORMAL LOW (ref 36.0–46.0)
Hemoglobin: 10.8 g/dL — ABNORMAL LOW (ref 12.0–15.0)
MCH: 33.1 pg (ref 26.0–34.0)
MCHC: 33.3 g/dL (ref 30.0–36.0)
MCV: 99.4 fL (ref 78.0–100.0)
PLATELETS: 350 10*3/uL (ref 150–400)
RBC: 3.26 MIL/uL — ABNORMAL LOW (ref 3.87–5.11)
RDW: 14.2 % (ref 11.5–15.5)
WBC: 7.4 10*3/uL (ref 4.0–10.5)

## 2015-07-08 LAB — BASIC METABOLIC PANEL
Anion gap: 10 (ref 5–15)
BUN: 10 mg/dL (ref 6–20)
CALCIUM: 8.9 mg/dL (ref 8.9–10.3)
CO2: 28 mmol/L (ref 22–32)
CREATININE: 0.67 mg/dL (ref 0.44–1.00)
Chloride: 101 mmol/L (ref 101–111)
GFR calc Af Amer: >60 mL/min (ref >60)
GLUCOSE: 91 mg/dL (ref 65–99)
Potassium: 3.8 mmol/L (ref 3.5–5.1)
SODIUM: 139 mmol/L (ref 135–145)

## 2015-07-08 LAB — APTT: APTT: 29 s (ref 24–37)

## 2015-07-08 LAB — PROTIME-INR
INR: 1.12 (ref 0.00–1.49)
Prothrombin Time: 14.6 seconds (ref 11.6–15.2)

## 2015-07-08 MED ORDER — MIDAZOLAM HCL 2 MG/2ML IJ SOLN
INTRAMUSCULAR | Status: AC | PRN
  Administered 2015-07-08 (×3): 1 mg via INTRAVENOUS

## 2015-07-08 MED ORDER — FENTANYL CITRATE (PF) 100 MCG/2ML IJ SOLN
INTRAMUSCULAR | Status: AC | PRN
  Administered 2015-07-08: 50 ug via INTRAVENOUS

## 2015-07-08 MED ORDER — MIDAZOLAM HCL 2 MG/2ML IJ SOLN
INTRAMUSCULAR | Status: AC
  Filled 2015-07-08: qty 6

## 2015-07-08 MED ORDER — LIDOCAINE-EPINEPHRINE 2 %-1:100000 IJ SOLN
INTRAMUSCULAR | Status: AC
Start: 2015-07-08 — End: 2015-07-08
  Filled 2015-07-08: qty 1

## 2015-07-08 MED ORDER — HEPARIN SOD (PORK) LOCK FLUSH 100 UNIT/ML IV SOLN
INTRAVENOUS | Status: AC | PRN
  Administered 2015-07-08: 500 [IU]

## 2015-07-08 MED ORDER — LIDOCAINE HCL 1 % IJ SOLN
INTRAMUSCULAR | Status: AC
  Filled 2015-07-08: qty 20

## 2015-07-08 MED ORDER — HEPARIN SOD (PORK) LOCK FLUSH 100 UNIT/ML IV SOLN
INTRAVENOUS | Status: AC
  Filled 2015-07-08: qty 5

## 2015-07-08 MED ORDER — SODIUM CHLORIDE 0.9 % IV SOLN
Freq: Once | INTRAVENOUS | Status: AC
  Administered 2015-07-08: 10:00:00 via INTRAVENOUS

## 2015-07-08 MED ORDER — VANCOMYCIN HCL IN DEXTROSE 1-5 GM/200ML-% IV SOLN
1000.0000 mg | Freq: Once | INTRAVENOUS | Status: AC
  Administered 2015-07-08: 1000 mg via INTRAVENOUS
  Filled 2015-07-08: qty 200

## 2015-07-08 MED ORDER — FENTANYL CITRATE (PF) 100 MCG/2ML IJ SOLN
INTRAMUSCULAR | Status: AC
  Filled 2015-07-08: qty 4

## 2015-07-08 NOTE — H&P (Signed)
Chief Complaint: Patient was seen in consultation today for placement of a Port A Cath at the request of Livesay,Lennis P  Referring Physician(s): Livesay,Lennis P  History of Present Illness: Jessica Payne is a 64 y.o. female with history of endometrial cancer who is here today for placement of a Port A Cath for chemotherapy.  She had a hysterectomy and bilateral salpingo-oophorectomy back in February.  She states she was previously on  Chemotherapy through a peripheral IV, however she states she needs a stronger one that has to be administered through a port.  She tells me she has peritoneal involvement and cytology of ascites is + for malignancy.  She reports a recent hospitalization for small bowel obstruction, and does state she has some intermittent nausea and vomiting, but she feels pretty good today.  She denies any fever or chills.  She denies blood thinning medications.  Past Medical History  Diagnosis Date  . Hypertension   . Acute peritonitis   . Ascites   . Low TSH level   . Hypothyroidism     1981   . Hyperthyroidism     03/2014   . GERD (gastroesophageal reflux disease)   . Headache     occasional migraine     Past Surgical History  Procedure Laterality Date  . Thyroidectomy    . Culposcopy     . Laparotomy N/A 01/12/2015    Procedure: EXPLORATORY LAPAROTOMY, RADICAL DEBULKING;  Surgeon: Everitt Amber, MD;  Location: [facility];  Service: Gynecology;  Laterality: N/A;  . Abdominal hysterectomy N/A 01/12/2015    Procedure: TOTAL HYSTERECTOMY ABDOMINAL;  Surgeon: Everitt Amber, MD;  Location: [facility];  Service: Gynecology;  Laterality: N/A;  . Salpingoophorectomy Bilateral 01/12/2015    Procedure: BILATERAL SALPINGO OOPHORECTOMY;  Surgeon: Everitt Amber, MD;  Location: [facility];  Service: Gynecology;  Laterality: Bilateral;  . Omentectomy N/A 01/12/2015    Procedure: OMENTECTOMY;  Surgeon: Everitt Amber, MD;  Location: [facility];  Service: Gynecology;  Laterality: N/A;     Allergies: Ciprofloxacin; Merthiolate; Penicillins; and Tramadol  Medications: Prior to Admission medications   Medication Sig Start Date End Date Taking? Authorizing Provider  feeding supplement (BOOST / RESOURCE BREEZE) LIQD Take 1 Container by mouth 3 (three) times daily between meals. 06/28/15   Florencia Reasons, MD  ibuprofen (ADVIL,MOTRIN) 200 MG tablet Take 200-400 mg by mouth every 6 (six) hours as needed for mild pain.    Historical Provider, MD  lidocaine-prilocaine (EMLA) cream Apply to Porta-Cath 1-2 hours prior to access  as needed 07/02/15   Lennis P Marko Plume, MD  loratadine (CLARITIN) 10 MG tablet Take 10 mg by mouth daily as needed for allergies.  03/01/15   Historical Provider, MD  LORazepam (ATIVAN) 1 MG tablet Place 1/2-1 tablet under the tongue or swallow every 6 hrs as needed for nausea.  Will make drowsy 03/01/15   Lennis Marion Downer, MD  magnesium oxide (MAG-OX) 400 (241.3 MG) MG tablet Take 1 tablet (400 mg total) by mouth 2 (two) times daily. 06/28/15   Florencia Reasons, MD  oxyCODONE (OXY IR/ROXICODONE) 5 MG immediate release tablet Take 1 tablet (5 mg total) by mouth every 4 (four) hours as needed for moderate pain or breakthrough pain. 05/27/15   Lennis Marion Downer, MD  pantoprazole (PROTONIX) 40 MG tablet Take 1 tablet (40 mg total) by mouth daily. 03/25/15   Lennis Marion Downer, MD  polyethylene glycol powder (GLYCOLAX/MIRALAX) powder Take 17 g by mouth as needed for severe constipation.  Historical Provider, MD  promethazine (PHENERGAN) 25 MG suppository Place 1 suppository (25 mg total) rectally every 6 (six) hours as needed for nausea or vomiting (NO NOT USE SUPPOSITORY IF  WHITE BLOOD COUNT IS LOW   Will Make drowsy). 02/08/15   Lennis Marion Downer, MD  sennosides-docusate sodium (SENOKOT-S) 8.6-50 MG tablet Take 1 tablet by mouth 2 (two) times daily as needed for constipation.     Historical Provider, MD  triamcinolone (KENALOG) 0.1 % paste Apply to tongue ulcer once daily at bedtime. 03/11/15    Lennis Marion Downer, MD     History reviewed. No pertinent family history.  Social History   Social History  . Marital Status: Married    Spouse Name: N/A  . Number of Children: N/A  . Years of Education: N/A   Social History Main Topics  . Smoking status: Former Smoker -- 1.00 packs/day for 20 years    Quit date: 08/20/2014  . Smokeless tobacco: Never Used  . Alcohol Use: No  . Drug Use: No  . Sexual Activity: Not Currently   Other Topics Concern  . None   Social History Narrative    ECOG Status:  Review of Systems  Constitutional: Positive for activity change, appetite change and fatigue. Negative for fever and chills.  HENT: Negative.   Respiratory: Negative for cough, chest tightness and shortness of breath.   Cardiovascular: Negative for chest pain.  Gastrointestinal: Positive for nausea and vomiting. Negative for abdominal pain.  Genitourinary: Negative.   Musculoskeletal: Negative.   Skin: Negative.   Neurological: Negative.   Psychiatric/Behavioral: Negative.     Vital Signs: BP 102/69 mmHg  Pulse 87  Temp(Src) 98.2 F (36.8 C) (Oral)  Resp 16  SpO2 95%  Physical Exam  Constitutional: She is oriented to person, place, and time. She appears well-developed and well-nourished.  HENT:  Head: Normocephalic and atraumatic.  Eyes: EOM are normal.  Neck: Normal range of motion. Neck supple.  Cardiovascular: Normal rate, regular rhythm and normal heart sounds.   Pulmonary/Chest: Effort normal and breath sounds normal.  Abdominal: Soft. Bowel sounds are normal. She exhibits no distension. There is no tenderness.  Musculoskeletal: Normal range of motion.  Neurological: She is alert and oriented to person, place, and time.  Skin: Skin is warm and dry.  Psychiatric: She has a normal mood and affect. Her behavior is normal. Judgment and thought content normal.  Vitals reviewed.   Mallampati Score:  MD Evaluation Airway: WNL Heart: WNL Abdomen: WNL Chest/  Lungs: WNL ASA  Classification: 3 Mallampati/Airway Score: Two  Imaging: Dg Abd 1 View  06/25/2015   CLINICAL DATA:  Peritoneal adenocarcinoma. Constipation. Uterine cancer.  EXAM: ABDOMEN - 1 VIEW  COMPARISON:  CT abdomen pelvis 12/15/2014  FINDINGS: Dilated small bowel loops in the left abdomen, suspicious for small-bowel obstruction. Upright view not obtained to evaluate for free air or air-fluid level. Colon decompressed.  Lumbar dextroscoliosis. No acute bony abnormality. Surgical clips the pelvis  IMPRESSION: Dilated small bowel loops suggestive of small bowel obstruction.   Electronically Signed   By: Franchot Gallo M.D.   On: 06/25/2015 12:38   Ct Abdomen Pelvis W Contrast  06/26/2015   CLINICAL DATA:  64 year old female with 1 week history of abdominal fullness. Decreased appetite. Vomiting.  EXAM: CT ABDOMEN AND PELVIS WITH CONTRAST  TECHNIQUE: Multidetector CT imaging of the abdomen and pelvis was performed using the standard protocol following bolus administration of intravenous contrast.  CONTRAST:  64mL OMNIPAQUE IOHEXOL 300  MG/ML SOLN, 149mL OMNIPAQUE IOHEXOL 300 MG/ML SOLN  COMPARISON:  CT of the abdomen and pelvis 12/15/2014.  FINDINGS: Lower chest:  Unremarkable.  Hepatobiliary: 1.2 cm rim calcified gallstone in the gallbladder. No current findings to suggest an acute cholecystitis at this time. 1.5 cm low-attenuation lesion in segment 8 of the liver is compatible with a simple cyst. Other smaller low-attenuation liver lesions are too small to definitively characterize, but are similar to prior examination and also likely small cysts. No other suspicious appearing hepatic lesions. No intra or extrahepatic biliary ductal dilatation.  Pancreas: No pancreatic mass. No pancreatic ductal dilatation. No pancreatic or peripancreatic fluid or inflammatory changes.  Spleen: Unremarkable.  Adrenals/Urinary Tract: Bilateral adrenal glands and bilateral kidneys are normal in appearance. No  hydroureteronephrosis. Urinary bladder is normal in appearance.  Stomach/Bowel: Nasogastric tube extending into the fundus of the stomach. Stomach is otherwise normal in appearance. No pathologic distention of small bowel or colon. Numerous colonic diverticulae, without definite surrounding inflammatory changes to suggest an acute diverticulitis at this time. Normal appendix.  Vascular/Lymphatic: Extensive atherosclerosis in the abdominal and pelvic vasculature, including fusiform ectasia of the infrarenal abdominal aorta which measures up to 2.8 x 2.9 cm. No lymphadenopathy noted in the abdomen or pelvis.  Reproductive: Status post hysterectomy. Ovaries are not confidently identified may be surgically absent or atrophic.  Other: Moderate to large volume of ascites which is increased compared to the prior study, and is now clearly associated with areas of peritoneal thickening and enhancement, as well as internal septations (image 41 of series 2 in the right side of the abdomen). No pneumoperitoneum.  Musculoskeletal: Chronic appearing compression fracture of T12 with approximately 15-20% loss of anterior vertebral body height. Sclerotic lesions in the pelvis bilaterally, similar to prior study 12/15/2014, presumably bone islands. There are no aggressive appearing lytic or blastic lesions noted in the visualized portions of the skeleton.  IMPRESSION: 1. Increasing volume of mildly complex ascites, now with extensive peritoneal thickening and enhancement. While this may simply reflect changes from chronic peritonitis, the possibility of malignancy in the peritoneal cavity should be considered, and correlation with nonemergent paracentesis should be considered, if not recently performed. 2. Cholelithiasis without evidence of acute cholecystitis at this time. 3. Colonic diverticulosis without evidence of acute diverticulitis. 4. Extensive atherosclerosis, including fusiform ectasia of the infrarenal abdominal aorta  which measures 2.8 x 2.9 cm. 5. Additional incidental findings, as above.   Electronically Signed   By: Vinnie Langton M.D.   On: 06/26/2015 11:48   US Paracentesis  06/28/2015   INDICATION: Primary peritoneal carcinoma, endometrial cancer, recurrent ascites. Request is made for diagnostic and therapeutic paracentesis.  EXAM: ULTRASOUND-GUIDED DIAGNOSTIC AND THERAPEUTIC PARACENTESIS  COMPARISON:  Prior paracentesis on 01/29/2015  MEDICATIONS: None.  COMPLICATIONS: None immediate  TECHNIQUE: Informed written consent was obtained from the patient after a discussion of the risks, benefits and alternatives to treatment. A timeout was performed prior to the initiation of the procedure.  Initial ultrasound scanning demonstrates a small to moderate amount of ascites within the left upper to mid abdominal quadrant. The left upper to mid abdomen was prepped and draped in the usual sterile fashion. 1% lidocaine was used for local anesthesia. Under direct ultrasound guidance, a 19 gauge, 10-cm, Yueh catheter was introduced. An ultrasound image was saved for documentation purposed. The paracentesis was performed. The catheter was removed and a dressing was applied. The patient tolerated the procedure well without immediate post procedural complication.  FINDINGS: A total of  approximately 1.5 liters of yellow fluid was removed. Samples were sent to the laboratory as requested by the clinical team.  IMPRESSION: Successful ultrasound-guided diagnostic and therapeutic paracentesis yielding 1.5 liters of peritoneal fluid.  Read by: Rowe Robert, PA-C   Electronically Signed   By: Sandi Mariscal M.D.   On: 06/28/2015 14:01   Dg Abd 2 Views  06/27/2015   CLINICAL DATA:  Small bowel obstruction. Right upper quadrant abdominal and bilateral lower quadrant pain. Nausea and vomiting. History of peritoneal cancer.  EXAM: ABDOMEN - 2 VIEW  COMPARISON:  CT 06/26/2015.  Radiographs 06/25/2015.  FINDINGS: Nasogastric tube tip is in the left  upper quadrant of the abdomen consistent with position in the mid stomach. The bowel is decompressed. There is enteric contrast throughout the colon. No extravasated enteric contrast or suspicious abdominal calcification demonstrated. There is no free intraperitoneal air. Generalized increased abdominal density corresponding with known ascites. Mild degenerative changes are noted throughout the spine.  IMPRESSION: Nasogastric tube tip in the mid stomach. No evidence of residual bowel obstruction.   Electronically Signed   By: Richardean Sale M.D.   On: 06/27/2015 09:32    Labs:  CBC:  Recent Labs  06/26/15 0406 06/27/15 0518 07/02/15 1058 07/08/15 1010  WBC 5.7 7.3 7.7 7.4  HGB 10.2* 10.6* 11.5* 10.8*  HCT 30.7* 32.1* 34.0* 32.4*  PLT 339 365 360 350    COAGS:  Recent Labs  06/26/15 0406  INR 1.17    BMP:  Recent Labs  01/13/15 0548  06/26/15 0406 06/27/15 0518 06/28/15 0445 07/02/15 1058  NA 134*  < > 140 138 137 136  K 4.7  < > 3.3* 3.8 3.7 3.5  CL 98  --  101 100* 100*  --   CO2 27  < > 30 29 30 29   GLUCOSE 169*  < > 118* 115* 90 127  BUN 11  < > 10 6 9  11.3  CALCIUM 8.0*  < > 8.7* 8.9 8.9 9.0  CREATININE 0.71  < > 0.56 0.60 0.61 0.8  GFRNONAA >90  --  >60 >60 >60  --   GFRAA >90  --  >60 >60 >60  --   < > = values in this interval not displayed.  LIVER FUNCTION TESTS:  Recent Labs  06/03/15 1235 06/25/15 1159 06/26/15 0406 07/02/15 1058  BILITOT 0.52 0.77 0.5 0.31  AST 14 17 18 13   ALT 13 18 14 14   ALKPHOS 124 113 81 101  PROT 6.5 7.1 6.4* 6.5  ALBUMIN 3.5 3.3* 3.0* 3.1*    TUMOR MARKERS: No results for input(s): AFPTM, CEA, CA199, CHROMGRNA in the last 8760 hours.  Assessment and Plan:  History of GYN cancer now with peritoneal involvement and malignant ascites.  Need for Central venous access for chemotherapy  Will proceed with placement of tunneled catheter with port today by Dr. Vernard Gambles.  Risks and Benefits discussed with the patient  including, but not limited to bleeding, infection, pneumothorax, or fibrin sheath development and need for additional procedures.  All of the patient's questions were answered, patient is agreeable to proceed. Consent signed and in chart.  Thank you for this interesting consult.  I greatly enjoyed meeting Jessica Payne and look forward to participating in their care.  A copy of this report was sent to the requesting provider on this date.  Signed: Murrell Redden PA-C 07/08/2015, 10:45 AM   I spent a total of  30 Minutes  in face to face  in clinical consultation, greater than 50% of which was counseling/coordinating care for placement of port a cath.

## 2015-07-08 NOTE — Discharge Instructions (Signed)
Implanted Port Insertion, Care After °Refer to this sheet in the next few weeks. These instructions provide you with information on caring for yourself after your procedure. Your health care provider may also give you more specific instructions. Your treatment has been planned according to current medical practices, but problems sometimes occur. Call your health care provider if you have any problems or questions after your procedure. °WHAT TO EXPECT AFTER THE PROCEDURE °After your procedure, it is typical to have the following:  °· Discomfort at the port insertion site. Ice packs to the area will help. °· Bruising on the skin over the port. This will subside in 3-4 days. °HOME CARE INSTRUCTIONS °· After your port is placed, you will get a manufacturer's information card. The card has information about your port. Keep this card with you at all times.   °· Know what kind of port you have. There are many types of ports available.   °· Wear a medical alert bracelet in case of an emergency. This can help alert health care workers that you have a port.   °· The port can stay in for as long as your health care provider believes it is necessary.   °· A home health care nurse may give medicines and take care of the port.   °· You or a family member can get special training and directions for giving medicine and taking care of the port at home.   °SEEK MEDICAL CARE IF:  °· Your port does not flush or you are unable to get a blood return.   °· You have a fever or chills. °SEEK IMMEDIATE MEDICAL CARE IF: °· You have new fluid or pus coming from your incision.   °· You notice a bad smell coming from your incision site.   °· You have swelling, pain, or more redness at the incision or port site.   °· You have chest pain or shortness of breath. °Document Released: 08/27/2013 Document Revised: 11/11/2013 Document Reviewed: 08/27/2013 °ExitCare® Patient Information ©2015 [employer]. This information is not intended to replace  advice given to you by your health care provider. Make sure you discuss any questions you have with your health care provider. °Implanted Port Home Guide °An implanted port is a type of central line that is placed under the skin. Central lines are used to provide IV access when treatment or nutrition needs to be given through a person's veins. Implanted ports are used for long-term IV access. An implanted port may be placed because:  °· You need IV medicine that would be irritating to the small veins in your hands or arms.   °· You need long-term IV medicines, such as antibiotics.   °· You need IV nutrition for a long period.   °· You need frequent blood draws for lab tests.   °· You need dialysis.   °Implanted ports are usually placed in the chest area, but they can also be placed in the upper arm, the abdomen, or the leg. An implanted port has two main parts:  °· Reservoir. The reservoir is round and will appear as a small, raised area under your skin. The reservoir is the part where a needle is inserted to give medicines or draw blood.   °· Catheter. The catheter is a thin, flexible tube that extends from the reservoir. The catheter is placed into a large vein. Medicine that is inserted into the reservoir goes into the catheter and then into the vein.   °HOW WILL I CARE FOR MY INCISION SITE? °Do not get the incision site wet. Bathe or   shower as directed by your health care provider.  °HOW IS MY PORT ACCESSED? °Special steps must be taken to access the port:  °· Before the port is accessed, a numbing cream can be placed on the skin. This helps numb the skin over the port site.   °· Your health care provider uses a sterile technique to access the port. °· Your health care provider must put on a mask and sterile gloves. °· The skin over your port is cleaned carefully with an antiseptic and allowed to dry. °· The port is gently pinched between sterile gloves, and a needle is inserted into the port. °· Only  "non-coring" port needles should be used to access the port. Once the port is accessed, a blood return should be checked. This helps ensure that the port is in the vein and is not clogged.   °· If your port needs to remain accessed for a constant infusion, a clear (transparent) bandage will be placed over the needle site. The bandage and needle will need to be changed every week, or as directed by your health care provider.   °· Keep the bandage covering the needle clean and dry. Do not get it wet. Follow your health care provider's instructions on how to take a shower or bath while the port is accessed.   °· If your port does not need to stay accessed, no bandage is needed over the port.   °WHAT IS FLUSHING? °Flushing helps keep the port from getting clogged. Follow your health care provider's instructions on how and when to flush the port. Ports are usually flushed with saline solution or a medicine called heparin. The need for flushing will depend on how the port is used.  °· If the port is used for intermittent medicines or blood draws, the port will need to be flushed:   °· After medicines have been given.   °· After blood has been drawn.   °· As part of routine maintenance.   °· If a constant infusion is running, the port may not need to be flushed.   °HOW LONG WILL MY PORT STAY IMPLANTED? °The port can stay in for as long as your health care provider thinks it is needed. When it is time for the port to come out, surgery will be done to remove it. The procedure is similar to the one performed when the port was put in.  °WHEN SHOULD I SEEK IMMEDIATE MEDICAL CARE? °When you have an implanted port, you should seek immediate medical care if:  °· You notice a bad smell coming from the incision site.   °· You have swelling, redness, or drainage at the incision site.   °· You have more swelling or pain at the port site or the surrounding area.   °· You have a fever that is not controlled with medicine. °Document  Released: 11/06/2005 Document Revised: 08/27/2013 Document Reviewed: 07/14/2013 °ExitCare® Patient Information ©2015 [employer]. This information is not intended to replace advice given to you by your health care provider. Make sure you discuss any questions you have with your health care provider.Conscious Sedation, Adult, Care After °Refer to this sheet in the next few weeks. These instructions provide you with information on caring for yourself after your procedure. Your health care provider may also give you more specific instructions. Your treatment has been planned according to current medical practices, but problems sometimes occur. Call your health care provider if you have any problems or questions after your procedure. °WHAT TO EXPECT AFTER THE PROCEDURE  °After your procedure: °·   You may feel sleepy, clumsy, and have poor balance for several hours. °· Vomiting may occur if you eat too soon after the procedure. °HOME CARE INSTRUCTIONS °· Do not participate in any activities where you could become injured for at least 24 hours. Do not: °¨ Drive. °¨ Swim. °¨ Ride a bicycle. °¨ Operate heavy machinery. °¨ Cook. °¨ Use power tools. °¨ Climb ladders. °¨ Work from a high place. °· Do not make important decisions or sign legal documents until you are improved. °· If you vomit, drink water, juice, or soup when you can drink without vomiting. Make sure you have little or no nausea before eating solid foods. °· Only take over-the-counter or prescription medicines for pain, discomfort, or fever as directed by your health care provider. °· Make sure you and your family fully understand everything about the medicines given to you, including what side effects may occur. °· You should not drink alcohol, take sleeping pills, or take medicines that cause drowsiness for at least 24 hours. °· If you smoke, do not smoke without supervision. °· If you are feeling better, you may resume normal activities 24 hours after you  were sedated. °· Keep all appointments with your health care provider. °SEEK MEDICAL CARE IF: °· Your skin is pale or bluish in color. °· You continue to feel nauseous or vomit. °· Your pain is getting worse and is not helped by medicine. °· You have bleeding or swelling. °· You are still sleepy or feeling clumsy after 24 hours. °SEEK IMMEDIATE MEDICAL CARE IF: °· You develop a rash. °· You have difficulty breathing. °· You develop any type of allergic problem. °· You have a fever. °MAKE SURE YOU: °· Understand these instructions. °· Will watch your condition. °· Will get help right away if you are not doing well or get worse. °Document Released: 08/27/2013 Document Reviewed: 08/27/2013 °ExitCare® Patient Information ©2015 [employer]. This information is not intended to replace advice given to you by your health care provider. Make sure you discuss any questions you have with your health care provider. ° °

## 2015-07-08 NOTE — Procedures (Signed)
R IJ Port cathter placement with US and fluoroscopy No complication No blood loss. See complete dictation in [facility].  

## 2015-07-09 ENCOUNTER — Ambulatory Visit: Payer: 59 | Admitting: Gynecologic Oncology

## 2015-07-09 ENCOUNTER — Ambulatory Visit: Payer: 59 | Attending: Gynecologic Oncology | Admitting: Gynecologic Oncology

## 2015-07-09 DIAGNOSIS — C482 Malignant neoplasm of peritoneum, unspecified: Secondary | ICD-10-CM

## 2015-07-09 NOTE — Patient Instructions (Signed)
Plan to follow up with Dr. Marko Plume as scheduled.  Continue with Doxil and Dr. Denman George will speak with Dr. Marko Plume about potentially adding in carboplatin.  Please call for any questions or concerns.

## 2015-07-12 ENCOUNTER — Ambulatory Visit: Payer: 59

## 2015-07-12 ENCOUNTER — Other Ambulatory Visit (HOSPITAL_BASED_OUTPATIENT_CLINIC_OR_DEPARTMENT_OTHER): Payer: 59

## 2015-07-12 ENCOUNTER — Other Ambulatory Visit: Payer: 59

## 2015-07-12 ENCOUNTER — Ambulatory Visit (HOSPITAL_BASED_OUTPATIENT_CLINIC_OR_DEPARTMENT_OTHER): Payer: 59

## 2015-07-12 VITALS — BP 153/82 | HR 99 | Temp 97.5°F | Resp 20

## 2015-07-12 DIAGNOSIS — C541 Malignant neoplasm of endometrium: Secondary | ICD-10-CM | POA: Diagnosis not present

## 2015-07-12 DIAGNOSIS — Z5111 Encounter for antineoplastic chemotherapy: Secondary | ICD-10-CM | POA: Diagnosis not present

## 2015-07-12 DIAGNOSIS — C482 Malignant neoplasm of peritoneum, unspecified: Secondary | ICD-10-CM

## 2015-07-12 LAB — CBC WITH DIFFERENTIAL/PLATELET
BASO%: 1 % (ref 0.0–2.0)
Basophils Absolute: 0.1 10*3/uL (ref 0.0–0.1)
EOS%: 3 % (ref 0.0–7.0)
Eosinophils Absolute: 0.2 10*3/uL (ref 0.0–0.5)
HCT: 33.4 % — ABNORMAL LOW (ref 34.8–46.6)
HGB: 11.4 g/dL — ABNORMAL LOW (ref 11.6–15.9)
LYMPH%: 33 % (ref 14.0–49.7)
MCH: 33.2 pg (ref 25.1–34.0)
MCHC: 34 g/dL (ref 31.5–36.0)
MCV: 97.5 fL (ref 79.5–101.0)
MONO#: 0.8 10*3/uL (ref 0.1–0.9)
MONO%: 12.5 % (ref 0.0–14.0)
NEUT%: 50.5 % (ref 38.4–76.8)
NEUTROS ABS: 3.1 10*3/uL (ref 1.5–6.5)
PLATELETS: 324 10*3/uL (ref 145–400)
RBC: 3.42 10*6/uL — AB (ref 3.70–5.45)
RDW: 15.7 % — ABNORMAL HIGH (ref 11.2–14.5)
WBC: 6.1 10*3/uL (ref 3.9–10.3)
lymph#: 2 10*3/uL (ref 0.9–3.3)

## 2015-07-12 LAB — COMPREHENSIVE METABOLIC PANEL (CC13)
ALT: 18 U/L (ref 0–55)
ANION GAP: 11 meq/L (ref 3–11)
AST: 17 U/L (ref 5–34)
Albumin: 3.1 g/dL — ABNORMAL LOW (ref 3.5–5.0)
Alkaline Phosphatase: 91 U/L (ref 40–150)
BUN: 7 mg/dL (ref 7.0–26.0)
CHLORIDE: 103 meq/L (ref 98–109)
CO2: 27 meq/L (ref 22–29)
Calcium: 9.7 mg/dL (ref 8.4–10.4)
Creatinine: 0.8 mg/dL (ref 0.6–1.1)
EGFR: 80 mL/min/{1.73_m2} — AB (ref >90)
GLUCOSE: 101 mg/dL (ref 70–140)
Potassium: 4.5 mEq/L (ref 3.5–5.1)
SODIUM: 142 meq/L (ref 136–145)
TOTAL PROTEIN: 6.6 g/dL (ref 6.4–8.3)
Total Bilirubin: 0.25 mg/dL (ref 0.20–1.20)

## 2015-07-12 MED ORDER — SODIUM CHLORIDE 0.9 % IJ SOLN
10.0000 mL | INTRAMUSCULAR | Status: DC | PRN
  Administered 2015-07-12: 10 mL
  Filled 2015-07-12: qty 10

## 2015-07-12 MED ORDER — SODIUM CHLORIDE 0.9 % IV SOLN
Freq: Once | INTRAVENOUS | Status: AC
  Administered 2015-07-12: 11:00:00 via INTRAVENOUS
  Filled 2015-07-12: qty 4

## 2015-07-12 MED ORDER — HEPARIN SOD (PORK) LOCK FLUSH 100 UNIT/ML IV SOLN
500.0000 [IU] | Freq: Once | INTRAVENOUS | Status: AC | PRN
  Administered 2015-07-12: 500 [IU]
  Filled 2015-07-12: qty 5

## 2015-07-12 MED ORDER — SODIUM CHLORIDE 0.9 % IV SOLN
Freq: Once | INTRAVENOUS | Status: AC
  Administered 2015-07-12: 11:00:00 via INTRAVENOUS

## 2015-07-12 MED ORDER — DOXORUBICIN HCL LIPOSOMAL CHEMO INJECTION 2 MG/ML
37.0000 mg/m2 | Freq: Once | INTRAVENOUS | Status: AC
  Administered 2015-07-12: 70 mg via INTRAVENOUS
  Filled 2015-07-12: qty 35

## 2015-07-12 NOTE — Patient Instructions (Signed)
Portal Discharge Instructions for Patients Receiving Chemotherapy  Today you received the following chemotherapy agents Doxcil  To help prevent nausea and vomiting after your treatment, we encourage you to take your nausea medication  Zofran and Ativan, Phenergan as directed   If you develop nausea and vomiting that is not controlled by your nausea medication, call the clinic.   BELOW ARE SYMPTOMS THAT SHOULD BE REPORTED IMMEDIATELY:  *FEVER GREATER THAN 100.5 F  *CHILLS WITH OR WITHOUT FEVER  NAUSEA AND VOMITING THAT IS NOT CONTROLLED WITH YOUR NAUSEA MEDICATION  *UNUSUAL SHORTNESS OF BREATH  *UNUSUAL BRUISING OR BLEEDING  TENDERNESS IN MOUTH AND THROAT WITH OR WITHOUT PRESENCE OF ULCERS  *URINARY PROBLEMS  *BOWEL PROBLEMS  UNUSUAL RASH Items with * indicate a potential emergency and should be followed up as soon as possible.  Feel free to call the clinic you have any questions or concerns. The clinic phone number is [phone].  Please show the [place] at check-in to the Emergency Department and triage nurse.

## 2015-07-13 ENCOUNTER — Telehealth: Payer: Self-pay

## 2015-07-13 ENCOUNTER — Encounter: Payer: Self-pay | Admitting: Gynecologic Oncology

## 2015-07-13 NOTE — Progress Notes (Signed)
Gyn Onc Followup Visit   Assessment:    64 y.o. 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     Not a surgical candidate due to diffuse carcinomatosis and platinum resistant disease.    Plan: Salvage therapy with Doxil.  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 Discussed diet modification to reduce risk of obstructive symptoms.  Patient will possibly require G tube palliation if she develops persistent obstructive symptoms with progression of disease.  HPI:  Jessica Payne is a 64 y.o. year old initially seen in consultation on 12/25/14, referred by Dr Tye Savoy, for signs of peritoneal carcinomatosis on imaging and paracentesis revealing adenocarcinoma.  She then underwent an exploratory laparotomy, TAH, BSO, omentectomy and complete tumor debulking surgery to R1 disease on 05/19/51 without complications. Intraoperative findings included 4 L of ascites, tumor studding (1 mm) over entire small bowel serosa, mesentery, diaphragm. A plaque of tumor on the bladder peritoneum. Tumor on the cul-de-sac peritoneum. Enlarged right ovary. Normal left ovary. Rind of tumor on the anterior sigmoid colon. 10 cm omental cake from the hepatic flexure to the splenic flexure. It was a optimal cytoreductive effort with less than 1 cm disease at any residual location and residual tumor remaining with tumor studding on the small and large intestine and mesentery. Frozen section operatively revealed poorly differentiated cancer of both the endometrium and metastatic in the omentum.  Her postoperative course was uncomplicated.  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   Primary peritoneal: Stage IIIC high grade serous involving omentum and peritoneal biopsies with metastatic  foci on the left ovary. Endometrial: incompletely staged, at least stage IB serous endometrial cancer with + lymphovascular space invasion, outer half myometrial invasion and a negative cervix. The lymph nodes were not sampled due to the extensive peritoneal tumor (these were not enlarged on preoperative imaging).   She was recommended to receive adjuvant chemotherapy with paclitaxel and carboplatin. She received 4 cycles of this between 02/04/15 and 05/27/15. She tolerated therapy poorly with neuropathy and neutropenia being dose limiting toxicities and meaning that she was unable to complete a planned therapeutic course. Her CA 125 failed to normalize (reduced to 100).  On 06/25/15 she was admitted to the hospital with symptoms of a bowel obstruction (nausea and emesis and obstipation). This resolved very quickly with bowel rest. However, CT imaging of the abdomen and pelvis revealed large volume ascites. Peritoneal thickening and enhancement. Paracentesis with cytology was performed and confirmed malignant ascites.  CA 125 on 06/26/15 was 708 (it had been 173 on 05/27/15 upon completing therapy).  Review of systems: Constitutional:  She has no weight gain or weight loss. She has no fever or chills. Eyes: No blurred vision Ears, Nose, Mouth, Throat: No dizziness, headaches or changes in hearing. No mouth sores. Cardiovascular: No chest pain, palpitations or edema. Respiratory:  No shortness of breath, wheezing or cough Gastrointestinal: She has normal bowel movements without diarrhea or constipation. She denies any nausea or vomiting. She denies blood in her stool or heart burn. Genitourinary:  She denies pelvic pain, pelvic pressure or changes in her urinary function. She has no hematuria, dysuria, or incontinence. She has no irregular vaginal bleeding or vaginal discharge Musculoskeletal: Denies muscle weakness or joint pains.  Skin:  She has no skin changes, rashes or  itching Neurological:  Denies  dizziness or headaches. No neuropathy, no numbness or tingling. Psychiatric:  She denies depression or anxiety. Hematologic/Lymphatic:   No easy bruising or bleeding   Physical Exam: There were no vitals taken for this visit. General: Well dressed, well nourished in no apparent distress.   HEENT:  Normocephalic and atraumatic, no lesions.  Extraocular muscles intact. Sclerae anicteric. Pupils equal, round, reactive. No mouth sores or ulcers. Thyroid is normal size, not nodular, midline. Skin:  No lesions or rashes. Breasts:  deferred. Lungs:  deferred. Cardiovascular:  deferred. Abdomen:  Soft, nontender, + distended softly. No palpable masses. Genitourinary: Normal EGBUS  Vaginal cuff intact.  No bleeding or discharge.  No cul de sac fullness. Extremities: No cyanosis, clubbing or edema.  No calf tenderness or erythema. No palpable cords. Psychiatric: Mood and affect are appropriate. Neurological: Awake, alert and oriented x 3. Sensation is intact, no neuropathy.  Musculoskeletal: No pain, normal strength and range of motion.    [name], MD

## 2015-07-13 NOTE — Telephone Encounter (Signed)
-----   Message from Cora Collum, RN sent at 07/12/2015 10:54 AM EDT ----- Regarding: Dr. Marko Plume...chemo follow up call 1st time Doxcil, Dr. Marko Plume

## 2015-07-13 NOTE — Telephone Encounter (Signed)
Ms. Channing is doing well.  She is eating and drinking fluids well.  Using antiemetics bid through tomorrow whether no not nauseous.  Hand some nausea last evening none today. She is  soaking hands and feet in cold water as directed to help minimize hand-foot syndrome  Suggested taking miralax this evening as bowels have not moved and repeat tomorrow  or bid if necessary.  She did move bowels yesterday. Ms. Newcom knows to call [phone] if any issues or concerns.

## 2015-07-14 ENCOUNTER — Ambulatory Visit: Payer: 59 | Admitting: Radiation Oncology

## 2015-07-14 ENCOUNTER — Ambulatory Visit: Admission: RE | Admit: 2015-07-14 | Payer: 59 | Source: Ambulatory Visit

## 2015-07-14 ENCOUNTER — Other Ambulatory Visit: Payer: Self-pay | Admitting: Oncology

## 2015-07-14 ENCOUNTER — Ambulatory Visit: Admission: RE | Admit: 2015-07-14 | Payer: 59 | Source: Ambulatory Visit | Admitting: Radiation Oncology

## 2015-07-14 DIAGNOSIS — C482 Malignant neoplasm of peritoneum, unspecified: Secondary | ICD-10-CM

## 2015-07-15 ENCOUNTER — Ambulatory Visit (HOSPITAL_BASED_OUTPATIENT_CLINIC_OR_DEPARTMENT_OTHER): Payer: 59 | Admitting: Oncology

## 2015-07-15 ENCOUNTER — Other Ambulatory Visit: Payer: 59

## 2015-07-15 ENCOUNTER — Encounter: Payer: Self-pay | Admitting: Oncology

## 2015-07-15 ENCOUNTER — Telehealth: Payer: Self-pay | Admitting: Oncology

## 2015-07-15 VITALS — BP 134/66 | HR 84 | Temp 97.8°F | Resp 18 | Ht 67.0 in | Wt 167.9 lb

## 2015-07-15 DIAGNOSIS — D500 Iron deficiency anemia secondary to blood loss (chronic): Secondary | ICD-10-CM

## 2015-07-15 DIAGNOSIS — D6481 Anemia due to antineoplastic chemotherapy: Secondary | ICD-10-CM | POA: Diagnosis not present

## 2015-07-15 DIAGNOSIS — C482 Malignant neoplasm of peritoneum, unspecified: Secondary | ICD-10-CM | POA: Diagnosis not present

## 2015-07-15 DIAGNOSIS — Z95828 Presence of other vascular implants and grafts: Secondary | ICD-10-CM

## 2015-07-15 DIAGNOSIS — N390 Urinary tract infection, site not specified: Secondary | ICD-10-CM

## 2015-07-15 DIAGNOSIS — N882 Stricture and stenosis of cervix uteri: Secondary | ICD-10-CM

## 2015-07-15 DIAGNOSIS — C541 Malignant neoplasm of endometrium: Secondary | ICD-10-CM | POA: Diagnosis not present

## 2015-07-15 DIAGNOSIS — R180 Malignant ascites: Secondary | ICD-10-CM

## 2015-07-15 DIAGNOSIS — G620 Drug-induced polyneuropathy: Secondary | ICD-10-CM

## 2015-07-15 DIAGNOSIS — N950 Postmenopausal bleeding: Secondary | ICD-10-CM

## 2015-07-15 DIAGNOSIS — T451X5A Adverse effect of antineoplastic and immunosuppressive drugs, initial encounter: Secondary | ICD-10-CM

## 2015-07-15 DIAGNOSIS — K56609 Unspecified intestinal obstruction, unspecified as to partial versus complete obstruction: Secondary | ICD-10-CM

## 2015-07-15 DIAGNOSIS — C55 Malignant neoplasm of uterus, part unspecified: Secondary | ICD-10-CM

## 2015-07-15 MED ORDER — LORAZEPAM 1 MG PO TABS: ORAL_TABLET | ORAL | Status: DC

## 2015-07-15 NOTE — Progress Notes (Signed)
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 Last chemotherapy was 05-27-15; she was hospitalized with SBO early August, with progression documented then. She saw Dr Denman George 07-13-15 and understands that treatment now is in palliative attempt. She had first doxil 07-12-15.  Patient has tolerated doxil well thus far. Echocardiogram 07-05-15 had EF 60%. She reports 2 episodes of self-limited abdominal pain since SBO treated in hospital. She has been very careful with diet choices since discussion with [place], can tolerate juice supplement when diluted. Bowels are moving with smaller amounts of stool than usual ~ 2x daily,  likely related to the low residue diet. She has no abdominal pain now, occasional mild nausea since doxil without vomiting in last several days.  She is not uncomfortable with PAC, does have EMLA. She has had no skin irritiation from the doxil, has used ice to hands and feet and is using lotion liberally.   PAC by IR 07-08-15 No genetics testing done  Pre-operative CA 125 1941 on 01-07-15   ONCOLOGIC HISTORY Patient had not had regular medical care since she lost job and medical insurance. She had progressive abdominal swelling over ~ 6 months as well as some postmenopausal bleeding when she was admitted to [facility] in 11-2014 with abdominal pain and presumed peritonitis, initially thought to be either gall bladder related or cirrhosis. Initial paracentesis 12-11-2014 was for 3.5 liters, with malignant cytology consistent with gyn malignancy. Notes refer to CT and MRI "which demonstrated omental thickening, extensive ascites, slight  enlargement of the right adnexa and a thickened endometrium (1.1 cm)." She was referred to Dr Josephina Shih, seen on 12-25-14 with endometrial biopsy not possible due to cervical stenosis. She had second paracentesis for 4.2 liters on 01-04-15. CA 125 on 01-07-15 was 1941 (pre op).She had TAH BSO, omentectomy and radical debulking by Dr Denman George on 01-12-15, with 4 liters of ascites at time of surgery. Findings at surgery were of milial tumor over entire small bowel serosa, mesentery, diaphragm, plaque on bladder and involving cul de sac peritoneum. Surgery was optimal R1 cytoreduction. Pathology [phone]) demonstrated high grade serous carcinoma, IIIC primary peritoneal and at least IB endometrial.. Recommendation was for 6 cycles of carboplatin and taxol, then likely whole pelvic RT + vaginal brachytherapy if she has CR. US paracentesis 01-29-15 for 1.5 liters. She began dose dense carboplatin taxol on 02-04-15, given thru day 1 cycle 6 on 05-27-15 (taxol held cycle 6 due to peripheral neuropathy). She was found to have progressive disease within 4 weeks of completing carbo taxol, when admitted with SBO early 06-2015. She began salvage doxil on 07-12-15.     Review of systems as above, also: No fever or symptoms of infection. Peripheral neuropathy stable in hands and feet. No respiratory symptoms. No LE swelling. Voiding ok.  Remainder of 10 point Review of Systems negative.  Objective:  Vital signs in last 24 hours:  BP 134/66 mmHg  Pulse 84  Temp(Src) 97.8 F (36.6 C) (Oral)  Resp 18  Ht _0  (1.702 m)  Wt 167 lb 14.4 oz (76.159 kg)  BMI 26.29 kg/m2  SpO2 98% Weight up 4 lbs. Alert, oriented and appropriate. Ambulatory without difficulty.  Hair is growing back.  HEENT:PERRL,  sclerae not icteric. Oral mucosa moist without lesions, posterior pharynx clear.  Neck supple. No JVD.  Lymphatics:no cervical,supraclavicular or inguinal adenopathy Resp: clear to auscultation bilaterally and normal  percussion bilaterally Cardio: regular rate and rhythm. No gallop. GI: soft, nontender, not distended, no mass or organomegaly. Normally active bowel sounds. Surgical incision not remarkable. Musculoskeletal/ Extremities: without pitting edema, cords, tenderness Neuro: no change peripheral neuropathy distal feet > fingers. Otherwise nonfocal. PSYCH appropriate mood and affect Skin without rash, ecchymosis, petechiae Portacath- minimal resolving ecchymosis, steristrips off, without erythema or unexpected tenderness  Lab Results:  Results for orders placed or performed in visit on 07/12/15  CBC with Differential  Result Value Ref Range   WBC 6.1 3.9 - 10.3 10e3/uL   NEUT# 3.1 1.5 - 6.5 10e3/uL   HGB 11.4 (L) 11.6 - 15.9 g/dL   HCT 33.4 (L) 34.8 - 46.6 %   Platelets 324 145 - 400 10e3/uL   MCV 97.5 79.5 - 101.0 fL   MCH 33.2 25.1 - 34.0 pg   MCHC 34.0 31.5 - 36.0 g/dL   RBC 3.42 (L) 3.70 - 5.45 10e6/uL   RDW 15.7 (H) 11.2 - 14.5 %   lymph# 2.0 0.9 - 3.3 10e3/uL   MONO# 0.8 0.1 - 0.9 10e3/uL   Eosinophils Absolute 0.2 0.0 - 0.5 10e3/uL   Basophils Absolute 0.1 0.0 - 0.1 10e3/uL   NEUT% 50.5 38.4 - 76.8 %   LYMPH% 33.0 14.0 - 49.7 %   MONO% 12.5 0.0 - 14.0 %   EOS% 3.0 0.0 - 7.0 %   BASO% 1.0 0.0 - 2.0 %  Comprehensive metabolic panel ([facility])  Result Value Ref Range   Sodium 142 136 - 145 mEq/L   Potassium 4.5 3.5 - 5.1 mEq/L   Chloride 103 98 - 109 mEq/L   CO2 27 22 - 29 mEq/L   Glucose 101 70 - 140 mg/dl   BUN 7.0 7.0 - 26.0 mg/dL   Creatinine 0.8 0.6 - 1.1 mg/dL   Total Bilirubin 0.25 0.20 - 1.20 mg/dL   Alkaline Phosphatase 91 40 - 150 U/L   AST 17 5 - 34 U/L   ALT 18 0 - 55 U/L   Total Protein 6.6 6.4 - 8.3 g/dL   Albumin 3.1 (L) 3.5 - 5.0 g/dL   Calcium 9.7 8.4 - 10.4 mg/dL   Anion Gap 11 3 - 11 mEq/L   EGFR 80 (L) >90 ml/min/1.73 m2   CA 125 from 06-25-15 reported 708 and drawn again 06-26-15 was 376  Studies/Results:  No results found.  Medications: I have  reviewed the patient's current medications. Refill ativan, last 02-2015.  DISCUSSION: Doxil skin care reviewed. Reviewed fact that doxil generally does not cause cytopenias to extent of other chemo regimens, tho counts can drop out 3+ weeks on this agent. Low residue diet reviewed, confirmed with dietician that ripe watermelon is ok for low residue.   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 Progressed around completion of adjuvant carbo taxol, so is platinum resistant/ refractory. Salvage doxil begun 07-12-15.  2.SBO during recent hospitalization and self-limited symptoms before and since the hospitalization 3. PAC placed by IR 4.low residue/ liquid/very soft diet due to recent SBO complications. Appreciate assistance from dietician, to see again on 08-09-15. Encouraged supplements.  5.Hypo K corrected. Continues po magnesium. Follow labs 6.long  past tobacco, COPD 7. Iron deficiency + chemo anemia: post IV feraheme. Improved on recent chemo break. 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  9. Post partial thyroidectomy for goiter ~ 1980, on medication until ~ 1 year ago. PCP managing. 10. Does not have advance directives.    All questions answered. She knows to call if problems prior to next scheduled visit. I will see her ~ 9-15 with labs from [facility], due #2 doxil on 08-09-15.    LIVESAY,LENNIS P, MD   07/15/2015, 2:42 PM

## 2015-07-15 NOTE — Telephone Encounter (Signed)
Gave avs & calendar for September/October/November.

## 2015-07-16 DIAGNOSIS — Z95828 Presence of other vascular implants and grafts: Secondary | ICD-10-CM | POA: Insufficient documentation

## 2015-07-25 ENCOUNTER — Other Ambulatory Visit: Payer: Self-pay | Admitting: Oncology

## 2015-07-25 DIAGNOSIS — C482 Malignant neoplasm of peritoneum, unspecified: Secondary | ICD-10-CM

## 2015-07-27 ENCOUNTER — Ambulatory Visit (HOSPITAL_BASED_OUTPATIENT_CLINIC_OR_DEPARTMENT_OTHER): Payer: 59

## 2015-07-27 ENCOUNTER — Ambulatory Visit (HOSPITAL_COMMUNITY)
Admission: RE | Admit: 2015-07-27 | Discharge: 2015-07-27 | Disposition: A | Discharge status: Home / Self Care | Payer: 59 | Source: Ambulatory Visit | Attending: Oncology | Admitting: Oncology

## 2015-07-27 ENCOUNTER — Ambulatory Visit (HOSPITAL_BASED_OUTPATIENT_CLINIC_OR_DEPARTMENT_OTHER): Payer: 59 | Admitting: Oncology

## 2015-07-27 ENCOUNTER — Telehealth: Payer: Self-pay | Admitting: Oncology

## 2015-07-27 ENCOUNTER — Other Ambulatory Visit (HOSPITAL_BASED_OUTPATIENT_CLINIC_OR_DEPARTMENT_OTHER): Payer: 59

## 2015-07-27 ENCOUNTER — Encounter: Payer: Self-pay | Admitting: Oncology

## 2015-07-27 VITALS — BP 127/80 | HR 98 | Temp 97.9°F | Resp 18 | Ht 67.0 in | Wt 160.5 lb

## 2015-07-27 DIAGNOSIS — T451X5A Adverse effect of antineoplastic and immunosuppressive drugs, initial encounter: Secondary | ICD-10-CM

## 2015-07-27 DIAGNOSIS — G620 Drug-induced polyneuropathy: Secondary | ICD-10-CM

## 2015-07-27 DIAGNOSIS — C786 Secondary malignant neoplasm of retroperitoneum and peritoneum: Secondary | ICD-10-CM | POA: Insufficient documentation

## 2015-07-27 DIAGNOSIS — R109 Unspecified abdominal pain: Secondary | ICD-10-CM

## 2015-07-27 DIAGNOSIS — R180 Malignant ascites: Secondary | ICD-10-CM

## 2015-07-27 DIAGNOSIS — C482 Malignant neoplasm of peritoneum, unspecified: Secondary | ICD-10-CM

## 2015-07-27 DIAGNOSIS — C541 Malignant neoplasm of endometrium: Secondary | ICD-10-CM | POA: Diagnosis not present

## 2015-07-27 DIAGNOSIS — R112 Nausea with vomiting, unspecified: Secondary | ICD-10-CM | POA: Diagnosis not present

## 2015-07-27 DIAGNOSIS — D509 Iron deficiency anemia, unspecified: Secondary | ICD-10-CM

## 2015-07-27 DIAGNOSIS — K5904 Chronic idiopathic constipation: Secondary | ICD-10-CM

## 2015-07-27 DIAGNOSIS — Z95828 Presence of other vascular implants and grafts: Secondary | ICD-10-CM

## 2015-07-27 DIAGNOSIS — R1033 Periumbilical pain: Secondary | ICD-10-CM | POA: Diagnosis not present

## 2015-07-27 DIAGNOSIS — D6481 Anemia due to antineoplastic chemotherapy: Secondary | ICD-10-CM

## 2015-07-27 LAB — CBC WITH DIFFERENTIAL/PLATELET
BASO%: 0.4 % (ref 0.0–2.0)
Basophils Absolute: 0 10*3/uL (ref 0.0–0.1)
EOS ABS: 0.1 10*3/uL (ref 0.0–0.5)
EOS%: 1.3 % (ref 0.0–7.0)
HEMATOCRIT: 35.7 % (ref 34.8–46.6)
HEMOGLOBIN: 12.4 g/dL (ref 11.6–15.9)
LYMPH#: 1.8 10*3/uL (ref 0.9–3.3)
LYMPH%: 27.4 % (ref 14.0–49.7)
MCH: 32.1 pg (ref 25.1–34.0)
MCHC: 34.7 g/dL (ref 31.5–36.0)
MCV: 92.5 fL (ref 79.5–101.0)
MONO#: 0.7 10*3/uL (ref 0.1–0.9)
MONO%: 10.6 % (ref 0.0–14.0)
NEUT#: 4 10*3/uL (ref 1.5–6.5)
NEUT%: 60.3 % (ref 38.4–76.8)
Platelets: 252 10*3/uL (ref 145–400)
RBC: 3.86 10*6/uL (ref 3.70–5.45)
RDW: 14.3 % (ref 11.2–14.5)
WBC: 6.7 10*3/uL (ref 3.9–10.3)
nRBC: 0 % (ref 0–0)

## 2015-07-27 LAB — COMPREHENSIVE METABOLIC PANEL (CC13)
ALBUMIN: 3.3 g/dL — AB (ref 3.5–5.0)
ALT: 26 U/L (ref 0–55)
AST: 24 U/L (ref 5–34)
Alkaline Phosphatase: 85 U/L (ref 40–150)
Anion Gap: 12 mEq/L — ABNORMAL HIGH (ref 3–11)
BUN: 10.3 mg/dL (ref 7.0–26.0)
CALCIUM: 9.4 mg/dL (ref 8.4–10.4)
CHLORIDE: 96 meq/L — AB (ref 98–109)
CO2: 28 mEq/L (ref 22–29)
CREATININE: 0.8 mg/dL (ref 0.6–1.1)
EGFR: 83 mL/min/{1.73_m2} — ABNORMAL LOW (ref >90)
GLUCOSE: 102 mg/dL (ref 70–140)
POTASSIUM: 3.3 meq/L — AB (ref 3.5–5.1)
SODIUM: 137 meq/L (ref 136–145)
Total Bilirubin: 0.49 mg/dL (ref 0.20–1.20)
Total Protein: 6.8 g/dL (ref 6.4–8.3)

## 2015-07-27 MED ORDER — SODIUM CHLORIDE 0.9 % IV SOLN
Freq: Once | INTRAVENOUS | Status: DC
  Filled 2015-07-27: qty 8

## 2015-07-27 MED ORDER — SODIUM CHLORIDE 0.9 % IV SOLN: Freq: Once | INTRAVENOUS | Status: DC

## 2015-07-27 MED ORDER — METOCLOPRAMIDE HCL 10 MG PO TABS
10.0000 mg | ORAL_TABLET | Freq: Three times a day (TID) | ORAL | Status: AC
Start: 2015-07-27 — End: ?

## 2015-07-27 NOTE — Progress Notes (Signed)
OFFICE PROGRESS NOTE   July 27, 2015   Physicians:Emma Glade Stanford (PCP)  INTERVAL HISTORY:  Patient is seen, together with sister, in scheduled follow up of progressive primary peritoneal carcinoma, post cycle 1 doxil on 07-12-15.   Patient has had nausea and intermittent vomiting multiple times since she was seen last on 07-15-15, generally several times thru night but not so much during day. Vomitus is dark green liquid. Bowels are moving small amount daily. She has not used miralax in a number of days. She has had no vomiting today by time of 1430 appointment, and she has kept down toast and water. She has had increased abdominal pain, mostly lateral abdomen on each side last pm and at other times centered at umbilicus and low pelvis. Abdomen seems more distended. SL ativan 0.5 mg does not seem helpful with nausea. She has not tried zofran since she was vomiting that up, has not tried phenergan suppositories. She feels a little lightheaded on standing at times.    PAC by IR 07-08-15  No genetics testing done  Pre-operative CA 125 1941 on 01-07-15   ONCOLOGIC HISTORY Patient had not had regular medical care since she lost job and medical insurance. She had progressive abdominal swelling over ~ 6 months as well as some postmenopausal bleeding when she was admitted to [facility] in 11-2014 with abdominal pain and presumed peritonitis, initially thought to be either gall bladder related or cirrhosis. Initial paracentesis 12-11-2014 was for 3.5 liters, with malignant cytology consistent with gyn malignancy. Notes refer to CT and MRI "which demonstrated omental thickening, extensive ascites, slight enlargement of the right adnexa and a thickened endometrium (1.1 cm)." She was referred to Dr Josephina Shih, seen on 12-25-14 with endometrial biopsy not possible due to cervical stenosis. She had second paracentesis for 4.2 liters on 01-04-15. CA 125 on 01-07-15 was 1941 (pre  op).She had TAH BSO, omentectomy and radical debulking by Dr Denman George on 01-12-15, with 4 liters of ascites at time of surgery. Findings at surgery were of milial tumor over entire small bowel serosa, mesentery, diaphragm, plaque on bladder and involving cul de sac peritoneum. Surgery was optimal R1 cytoreduction. Pathology [phone]) demonstrated high grade serous carcinoma, IIIC primary peritoneal and at least IB endometrial.. Recommendation was for 6 cycles of carboplatin and taxol, then likely whole pelvic RT + vaginal brachytherapy if she has CR. US paracentesis 01-29-15 for 1.5 liters. She began dose dense carboplatin taxol on 02-04-15, given thru day 1 cycle 6 on 05-27-15 (taxol held cycle 6 due to peripheral neuropathy). She was found to have progressive disease within 4 weeks of completing carbo taxol, when admitted with SBO early 06-2015. She began salvage doxil on 07-12-15.     Review of systems as above, also: No fever or symptoms of infection. A bit more SOB, no chest pain or cough. No problems with PAC. No LE swelling. Less volume of urine, no dysuria. No bleeding.  Remainder of 10 point Review of Systems negative.  Objective:  Vital signs in last 24 hours:  BP 127/80 mmHg  Pulse 98  Temp(Src) 97.9 F (36.6 C) (Oral)  Resp 18  Ht 5\' 7"  (1.702 m)  Wt 160 lb 8 oz (72.802 kg)  BMI 25.13 kg/m2  SpO2 100% Weight down 7 lbs since 07-15-15.  Alert, oriented and appropriate. Ambulatory without assistance into office. Changes position easily, respirations not labored RA, NAD.  Hair is growing back  HEENT:PERRL, sclerae not icteric. Oral mucosa moist without  lesions, posterior pharynx clear.  Neck supple. No JVD.  Lymphatics:no cervical,supraclavicular adenopathy Resp: somewhat diminished BS thruout otherwise clear to auscultation bilaterally and normal percussion bilaterally Cardio: regular rate and rhythm. No gallop. GI: soft, nontender, somewhat distended, no mass or organomegaly. A few  bowel sounds, without rushing. Surgical incision not remarkable. Musculoskeletal/ Extremities: without pitting edema, cords, tenderness Neuro: no peripheral neuropathy. Otherwise nonfocal. Psych: appropriate mood and affect Skin without rash, ecchymosis, petechiae Portacath-without erythema or tenderness  Lab Results:  Results for orders placed or performed in visit on 07/27/15  CBC with Differential  Result Value Ref Range   WBC 6.7 3.9 - 10.3 10e3/uL   NEUT# 4.0 1.5 - 6.5 10e3/uL   HGB 12.4 11.6 - 15.9 g/dL   HCT 35.7 34.8 - 46.6 %   Platelets 252 145 - 400 10e3/uL   MCV 92.5 79.5 - 101.0 fL   MCH 32.1 25.1 - 34.0 pg   MCHC 34.7 31.5 - 36.0 g/dL   RBC 3.86 3.70 - 5.45 10e6/uL   RDW 14.3 11.2 - 14.5 %   lymph# 1.8 0.9 - 3.3 10e3/uL   MONO# 0.7 0.1 - 0.9 10e3/uL   Eosinophils Absolute 0.1 0.0 - 0.5 10e3/uL   Basophils Absolute 0.0 0.0 - 0.1 10e3/uL   NEUT% 60.3 38.4 - 76.8 %   LYMPH% 27.4 14.0 - 49.7 %   MONO% 10.6 0.0 - 14.0 %   EOS% 1.3 0.0 - 7.0 %   BASO% 0.4 0.0 - 2.0 %   nRBC 0 0 - 0 %    CMET drawn after visit: K 3.3, Cl 96, creat 0.8, alb 3.3, LFTs WNL, glucose 102 CA 125 available after visit 3283, this having been 376.8 on 06-26-15 (?)   I will ask lab to confirm result  Studies/Results:  Patient sent from office for abd Xray ABDOMEN - 1 VIEW  COMPARISON: CT abdomen pelvis of 06/26/2015 and 12/15/2014  FINDINGS: A supine film the abdomen shows a nonspecific bowel gas pattern. No distention of large or small bowel is seen. No opaque calculi are noted. Surgical clips are noted in the pelvis. No bony abnormality is seen.  IMPRESSION: No bowel obstruction. No opaque calculi.  PACs images reviewed with patient and sister. Good bowel gas pattern thruout, with apparent significant amount of stool also thruout.   Medications: I have reviewed the patient's current medications. She will take miralax this afternoon and daily. Start Reglan 10 mg ac and hs.    DISCUSSION: Medications as above. IVF at [facility] in AM, will check BMET either today back at lab or in AM in case needs K added to IVF. Will request US paracentesis if significant ascites present on imaging (last paracentesis 06-28-15 for 1.5 liters). Primarily liquid diet tonight and in AM. Try sleeping in recliner or couch, not flat in bed. Not much po after ~ 1730 tonight. Take ativan 1 mg SL at hs even if not nauseated then. Will try reglan since no obstruction. Hopefully will be able to take second doxil as planned on 08-09-15.   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 Progressed around completion of adjuvant carbo taxol, so is platinum resistant/ refractory. Salvage doxil begun 07-12-15, tho chance of response low. Interventions for GI symptoms as above.  2.SBO during recent hospitalization and self-limited symptoms before and since the hospitalization. No  obstruction by xray now.  3. PAC placed by IR 4.low residue/ liquid/very soft diet due to recent SBO complications. Appreciate assistance from dietician, to see again on 08-09-15. Encouraged supplements.  5..long past tobacco, COPD 6 Iron deficiency + chemo anemia: post IV feraheme. Improved on recent chemo break. 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 Post partial thyroidectomy for goiter ~ 1980, on medication until ~ 1 year ago. PCP managing. 9. No advance directives per EMR information   All questions answered. IVF and zofran orders placed, US paracentesis requested. Time spent 30 min including >50% counseling and coordination of care. Cc Dr Candace Gallus, MD   07/27/2015, 2:17 PM

## 2015-07-27 NOTE — Telephone Encounter (Signed)
Gave avs & calendar for September °

## 2015-07-28 ENCOUNTER — Other Ambulatory Visit: Payer: Self-pay | Admitting: Medical Oncology

## 2015-07-28 ENCOUNTER — Ambulatory Visit (HOSPITAL_BASED_OUTPATIENT_CLINIC_OR_DEPARTMENT_OTHER): Payer: 59

## 2015-07-28 VITALS — BP 146/64 | HR 96 | Temp 98.0°F | Resp 18

## 2015-07-28 DIAGNOSIS — R300 Dysuria: Secondary | ICD-10-CM

## 2015-07-28 DIAGNOSIS — R112 Nausea with vomiting, unspecified: Secondary | ICD-10-CM | POA: Diagnosis not present

## 2015-07-28 DIAGNOSIS — C541 Malignant neoplasm of endometrium: Secondary | ICD-10-CM | POA: Diagnosis not present

## 2015-07-28 DIAGNOSIS — C482 Malignant neoplasm of peritoneum, unspecified: Secondary | ICD-10-CM

## 2015-07-28 LAB — URINALYSIS, MICROSCOPIC - CHCC
BILIRUBIN (URINE): NEGATIVE
GLUCOSE UR CHCC: NEGATIVE mg/dL
KETONES: NEGATIVE mg/dL
Nitrite: NEGATIVE
PH: 6 (ref 4.6–8.0)
PROTEIN: 30 mg/dL
Specific Gravity, Urine: 1.015 (ref 1.003–1.035)
Urobilinogen, UR: 0.2 mg/dL (ref 0.2–1)

## 2015-07-28 LAB — CA 125: CA 125: 3283 U/mL — ABNORMAL HIGH (ref <35)

## 2015-07-28 MED ORDER — SODIUM CHLORIDE 0.9 % IV SOLN
1000.0000 mL | INTRAVENOUS | Status: DC
  Administered 2015-07-28: 09:00:00 via INTRAVENOUS

## 2015-07-28 MED ORDER — SODIUM CHLORIDE 0.9 % IV SOLN
Freq: Once | INTRAVENOUS | Status: AC | PRN
  Administered 2015-07-28: 09:00:00 via INTRAVENOUS
  Filled 2015-07-28: qty 4

## 2015-07-28 MED ORDER — HEPARIN SOD (PORK) LOCK FLUSH 100 UNIT/ML IV SOLN
500.0000 [IU] | Freq: Once | INTRAVENOUS | Status: AC
  Administered 2015-07-28: 500 [IU] via INTRAVENOUS
  Filled 2015-07-28: qty 5

## 2015-07-28 MED ORDER — SODIUM CHLORIDE 0.9 % IJ SOLN
10.0000 mL | INTRAMUSCULAR | Status: DC | PRN
  Administered 2015-07-28: 10 mL via INTRAVENOUS
  Filled 2015-07-28: qty 10

## 2015-07-28 NOTE — Patient Instructions (Signed)

## 2015-07-29 ENCOUNTER — Other Ambulatory Visit: Payer: Self-pay | Admitting: *Deleted

## 2015-07-29 ENCOUNTER — Telehealth: Payer: Self-pay | Admitting: *Deleted

## 2015-07-29 ENCOUNTER — Telehealth: Payer: Self-pay | Admitting: Nurse Practitioner

## 2015-07-29 ENCOUNTER — Ambulatory Visit: Payer: 59

## 2015-07-29 MED ORDER — NITROFURANTOIN MONOHYD MACRO 100 MG PO CAPS: 100.0000 mg | ORAL_CAPSULE | Freq: Two times a day (BID) | ORAL | Status: DC

## 2015-07-29 NOTE — Telephone Encounter (Signed)
-----   Message from Gordy Levan, MD sent at 07/28/2015  9:10 PM EDT ----- RN please call to check on her 9-8 or 9-9  Had IVF on 9-7. I do not see US paracentesis set up yet-  Are bowels moving better with miralax? Has she tried Reglan? If she can tolerate gatorade, that would be good to add.  thanks

## 2015-07-29 NOTE — Telephone Encounter (Signed)
Rn called patient for update per MD below. Patient reports her bowels are moving better and she denies nausea or vomiting. She has not tried gatorade but is taking regular amounts of food and liquids po. She reports her breathing is ok and feels "really good today." Rn reports paracentesis scheduled 07/30/15 at [facility] @ 2:15 but patient wants to cancel. She states that her ABD was filled with stool, and now that she is having BMs, wants to "see how that goes." she is also asking about urinalysis results. Will inform Dr. Marko Plume.

## 2015-07-29 NOTE — Telephone Encounter (Signed)
Follow-up call to pt concerning her urinary/bladder issues. I told the pt we are still waiting on the results of her urine culture to come back. I asked pt was she having any symptoms at all when she voids. Pt said when she urinates, its a small amount then she has some abdominal cramps thereafter. She says it feels like I have a bladder infection". I have called in a Rx of Macrobid 100 mg BID to her pharmacy, Disp-14 R-0 for her to pick up this evening. Pt verbalized understanding. I told her when the results from the urine culture come in we will call her.Message to be fwd to Evlyn Clines, MD.

## 2015-07-29 NOTE — Telephone Encounter (Signed)
Per MD, urine culture pending but can call in ABX (macrobid 100 mg BID) for patient if symptomatic while we await culture. Rn called to inform patient of this but no answer. Left message to return call to [facility] at [phone].

## 2015-07-30 ENCOUNTER — Ambulatory Visit (HOSPITAL_COMMUNITY)
Admission: RE | Admit: 2015-07-30 | Discharge: 2015-07-30 | Disposition: A | Discharge status: Home / Self Care | Payer: 59 | Source: Ambulatory Visit | Attending: Oncology | Admitting: Oncology

## 2015-07-31 LAB — URINE CULTURE

## 2015-08-02 ENCOUNTER — Other Ambulatory Visit (HOSPITAL_COMMUNITY): Payer: 59

## 2015-08-04 ENCOUNTER — Other Ambulatory Visit: Payer: Self-pay | Admitting: Oncology

## 2015-08-04 DIAGNOSIS — B9620 Unspecified Escherichia coli [E. coli] as the cause of diseases classified elsewhere: Secondary | ICD-10-CM

## 2015-08-04 DIAGNOSIS — C482 Malignant neoplasm of peritoneum, unspecified: Secondary | ICD-10-CM

## 2015-08-04 DIAGNOSIS — N390 Urinary tract infection, site not specified: Secondary | ICD-10-CM

## 2015-08-05 ENCOUNTER — Encounter: Payer: Self-pay | Admitting: Oncology

## 2015-08-05 ENCOUNTER — Ambulatory Visit (HOSPITAL_BASED_OUTPATIENT_CLINIC_OR_DEPARTMENT_OTHER): Payer: 59

## 2015-08-05 ENCOUNTER — Ambulatory Visit (HOSPITAL_BASED_OUTPATIENT_CLINIC_OR_DEPARTMENT_OTHER): Payer: 59 | Admitting: Oncology

## 2015-08-05 ENCOUNTER — Other Ambulatory Visit (HOSPITAL_BASED_OUTPATIENT_CLINIC_OR_DEPARTMENT_OTHER): Payer: 59

## 2015-08-05 VITALS — BP 134/76 | HR 103 | Temp 98.3°F | Resp 18 | Ht 67.0 in | Wt 160.3 lb

## 2015-08-05 DIAGNOSIS — C482 Malignant neoplasm of peritoneum, unspecified: Secondary | ICD-10-CM | POA: Diagnosis not present

## 2015-08-05 DIAGNOSIS — C541 Malignant neoplasm of endometrium: Secondary | ICD-10-CM

## 2015-08-05 DIAGNOSIS — N390 Urinary tract infection, site not specified: Secondary | ICD-10-CM

## 2015-08-05 DIAGNOSIS — K5909 Other constipation: Secondary | ICD-10-CM | POA: Diagnosis not present

## 2015-08-05 DIAGNOSIS — Z95828 Presence of other vascular implants and grafts: Secondary | ICD-10-CM

## 2015-08-05 DIAGNOSIS — K219 Gastro-esophageal reflux disease without esophagitis: Secondary | ICD-10-CM

## 2015-08-05 DIAGNOSIS — B9620 Unspecified Escherichia coli [E. coli] as the cause of diseases classified elsewhere: Secondary | ICD-10-CM

## 2015-08-05 DIAGNOSIS — A499 Bacterial infection, unspecified: Secondary | ICD-10-CM

## 2015-08-05 LAB — URINALYSIS, MICROSCOPIC - CHCC
Bilirubin (Urine): NEGATIVE
Glucose: NEGATIVE mg/dL
KETONES: NEGATIVE mg/dL
Nitrite: NEGATIVE
PH: 6 (ref 4.6–8.0)
Specific Gravity, Urine: 1.015 (ref 1.003–1.035)
UROBILINOGEN UR: 0.2 mg/dL (ref 0.2–1)

## 2015-08-05 LAB — BASIC METABOLIC PANEL (CC13)
Anion Gap: 9 mEq/L (ref 3–11)
BUN: 8.7 mg/dL (ref 7.0–26.0)
CHLORIDE: 100 meq/L (ref 98–109)
CO2: 28 meq/L (ref 22–29)
CREATININE: 0.7 mg/dL (ref 0.6–1.1)
Calcium: 8.9 mg/dL (ref 8.4–10.4)
EGFR: 87 mL/min/{1.73_m2} — ABNORMAL LOW (ref >90)
GLUCOSE: 117 mg/dL (ref 70–140)
Potassium: 3.4 mEq/L — ABNORMAL LOW (ref 3.5–5.1)
SODIUM: 137 meq/L (ref 136–145)

## 2015-08-05 LAB — CBC WITH DIFFERENTIAL/PLATELET
BASO%: 0.4 % (ref 0.0–2.0)
BASOS ABS: 0 10*3/uL (ref 0.0–0.1)
EOS ABS: 0 10*3/uL (ref 0.0–0.5)
EOS%: 0.5 % (ref 0.0–7.0)
HEMATOCRIT: 34.1 % — AB (ref 34.8–46.6)
HEMOGLOBIN: 11.6 g/dL (ref 11.6–15.9)
LYMPH%: 23.8 % (ref 14.0–49.7)
MCH: 31.2 pg (ref 25.1–34.0)
MCHC: 34 g/dL (ref 31.5–36.0)
MCV: 91.7 fL (ref 79.5–101.0)
MONO#: 1.3 10*3/uL — ABNORMAL HIGH (ref 0.1–0.9)
MONO%: 17.1 % — AB (ref 0.0–14.0)
NEUT#: 4.4 10*3/uL (ref 1.5–6.5)
NEUT%: 58.2 % (ref 38.4–76.8)
Platelets: 294 10*3/uL (ref 145–400)
RBC: 3.72 10*6/uL (ref 3.70–5.45)
RDW: 14 % (ref 11.2–14.5)
WBC: 7.6 10*3/uL (ref 3.9–10.3)
lymph#: 1.8 10*3/uL (ref 0.9–3.3)

## 2015-08-05 MED ORDER — HEPARIN SOD (PORK) LOCK FLUSH 100 UNIT/ML IV SOLN
500.0000 [IU] | Freq: Once | INTRAVENOUS | Status: AC
  Administered 2015-08-05: 500 [IU] via INTRAVENOUS
  Filled 2015-08-05: qty 5

## 2015-08-05 MED ORDER — SODIUM CHLORIDE 0.9 % IJ SOLN
10.0000 mL | INTRAMUSCULAR | Status: DC | PRN
  Administered 2015-08-05: 10 mL via INTRAVENOUS
  Filled 2015-08-05: qty 10

## 2015-08-05 NOTE — Patient Instructions (Signed)

## 2015-08-05 NOTE — Progress Notes (Signed)
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 She is due cycle 2 doxil on 08-09-15.   Patient has felt significantly better since improvement in constipation, with bowels moving daily after total of 3 doses of miralax in last week. She did not start Reglan (even the lower 5 mg dose that Dr Philip Aspen suggested). She has had no vomiting in past 3 days and no GERD last pm such that she was able to sleep well. She tolerated part of a hamburger yesterday,  is following low residue diet and is to meet with [place] again on 08-09-15. She has not finished macrobid for 75K E coli in urine culture from 07-28-15, which seemed symptomatic then; no dysuria and no fever now.    PAC by IR 07-08-15  No genetics testing done  Pre-operative CA 125 was 1941 on 01-07-15  ONCOLOGIC HISTORY Patient had not had regular medical care since she lost job and medical insurance. She had progressive abdominal swelling over ~ 6 months as well as some postmenopausal bleeding when she was admitted to [facility] in 11-2014 with abdominal pain and presumed peritonitis, initially thought to be either gall bladder related or cirrhosis. Initial paracentesis 12-11-2014 was for 3.5 liters, with malignant cytology consistent with gyn malignancy. Notes refer to CT and MRI "which demonstrated omental thickening, extensive ascites, slight enlargement of the right adnexa and a thickened endometrium (1.1 cm)." She was referred to Dr Josephina Shih, seen on 12-25-14 with endometrial biopsy not possible due to cervical stenosis. She had second paracentesis for 4.2 liters on 01-04-15. CA 125 on 01-07-15 was 1941 (pre op).She had TAH BSO, omentectomy and radical debulking by Dr Denman George on 01-12-15, with 4 liters of  ascites at time of surgery. Findings at surgery were of milial tumor over entire small bowel serosa, mesentery, diaphragm, plaque on bladder and involving cul de sac peritoneum. Surgery was optimal R1 cytoreduction. Pathology [phone]) demonstrated high grade serous carcinoma, IIIC primary peritoneal and at least IB endometrial.. Recommendation was for 6 cycles of carboplatin and taxol, then likely whole pelvic RT + vaginal brachytherapy if she has CR. US paracentesis 01-29-15 for 1.5 liters. She began dose dense carboplatin taxol on 02-04-15, given thru day 1 cycle 6 on 05-27-15 (taxol held cycle 6 due to peripheral neuropathy). She was found to have progressive disease within 4 weeks of completing carbo taxol, when admitted with SBO early 06-2015. She began salvage doxil on 07-12-15.     Review of systems as above, also: Less abdominal distension since bowels moving better. No skin irritation Remainder of 10 point Review of Systems negative.  Objective:  Vital signs in last 24 hours:  BP 134/76 mmHg  Pulse 103  Temp(Src) 98.3 F (36.8 C) (Oral)  Resp 18  Ht 5' 7"  (1.702 m)  Wt 160 lb 4.8 oz (72.712 kg)  BMI 25.10 kg/m2 Weight stable. Looks brighter and more comfortable, sister very supportive Alert, oriented and appropriate. Ambulatory without difficulty.  Alopecia  HEENT:PERRL, sclerae not icteric. Oral mucosa moist without lesions, posterior pharynx clear.  Neck supple. No JVD.  Lymphatics:no cervical,supraclavicular adenopathy Resp: clear to auscultation bilaterally Cardio: regular rate and rhythm. No gallop. GI: soft, nontender, mildly distended but not tight. Normally active bowel sounds. Surgical incision not remarkable. Musculoskeletal/ Extremities: without pitting edema, cords, tenderness  Neuro: no change peripheral neuropathy. Otherwise nonfocal Skin without rash, ecchymosis, petechiae Portacath-without erythema or tenderness  Lab Results:  Results for orders placed or  performed in visit on 82/99/37  Basic metabolic panel (Bmet) - [facility]  Result Value Ref Range   Sodium 137 136 - 145 mEq/L   Potassium 3.4 (L) 3.5 - 5.1 mEq/L   Chloride 100 98 - 109 mEq/L   CO2 28 22 - 29 mEq/L   Glucose 117 70 - 140 mg/dl   BUN 8.7 7.0 - 26.0 mg/dL   Creatinine 0.7 0.6 - 1.1 mg/dL   Calcium 8.9 8.4 - 10.4 mg/dL   Anion Gap 9 3 - 11 mEq/L   EGFR 87 (L) >90 ml/min/1.73 m2  CBC with Differential  Result Value Ref Range   WBC 7.6 3.9 - 10.3 10e3/uL   NEUT# 4.4 1.5 - 6.5 10e3/uL   HGB 11.6 11.6 - 15.9 g/dL   HCT 34.1 (L) 34.8 - 46.6 %   Platelets 294 145 - 400 10e3/uL   MCV 91.7 79.5 - 101.0 fL   MCH 31.2 25.1 - 34.0 pg   MCHC 34.0 31.5 - 36.0 g/dL   RBC 3.72 3.70 - 5.45 10e6/uL   RDW 14.0 11.2 - 14.5 %   lymph# 1.8 0.9 - 3.3 10e3/uL   MONO# 1.3 (H) 0.1 - 0.9 10e3/uL   Eosinophils Absolute 0.0 0.0 - 0.5 10e3/uL   Basophils Absolute 0.0 0.0 - 0.1 10e3/uL   NEUT% 58.2 38.4 - 76.8 %   LYMPH% 23.8 14.0 - 49.7 %   MONO% 17.1 (H) 0.0 - 14.0 %   EOS% 0.5 0.0 - 7.0 %   BASO% 0.4 0.0 - 2.0 %    Will repeat urine culture off antibiotics when she comes for doxil on 08-09-15. CA 125 marker results difficult to interpret  Studies/Results:  No results found.  Medications: I have reviewed the patient's current medications. She understands that she can use miralax daily if needed, does not need to add reglan as long as symptoms all better without it now. Finish Macrobid. Continue daily protonix.   DISCUSSION: meds as above. By exam does not appear to have enough ascites to warrant paracentesis, patient agrees. Clinically she is doing better with supportive care changes and is in agreement with doxil as planned 08-09-15. Skin care with doxil as instructed. Discussed fact that marker improvement with doxil can take longer than other regimens and that marker at times increases after doxil begins prior to showing improvement. She would like to be able to eat fruits/ vegetables,  reminded that spinach is low fiber.   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 Progressed around completion of adjuvant carbo taxol, so is platinum resistant/ refractory. Salvage doxil begun 07-12-15, tho chance of response low. Interventions for GI symptoms as above.  2.SBO during recent hospitalization and self-limited symptoms before and since the hospitalization. Needs to continue laxative regimen to keep bowels moving daily if possible. Low residue diet, assistance from dietician appreciated 3. PAC placed by IR 4.Needs flu shot ~ week of Oct 3, which she can have here or Dr Shon Baton office.  5..long past tobacco, COPD 6 Iron deficiency + chemo anemia: post IV feraheme. Improved on recent chemo break. 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 Post partial thyroidectomy for goiter ~ 1980,  on medication until ~ 1 year ago. PCP managing. 9. No advance directives per EMR information 10. Need to consider genetics testing   Doxil orders confirmed, urine culture off antibiotics also 9-19. I will see her back with labs in ~ 3 weeks. They know to call prior to scheduled appointments if needed. Time spent 25 min including >50% counseling and coordination of care. CC Dr Candace Gallus, MD   08/05/2015, 12:48 PM

## 2015-08-07 DIAGNOSIS — K5909 Other constipation: Secondary | ICD-10-CM | POA: Insufficient documentation

## 2015-08-09 ENCOUNTER — Ambulatory Visit: Payer: 59

## 2015-08-09 ENCOUNTER — Ambulatory Visit: Payer: 59 | Admitting: Nutrition

## 2015-08-09 ENCOUNTER — Ambulatory Visit (HOSPITAL_BASED_OUTPATIENT_CLINIC_OR_DEPARTMENT_OTHER): Payer: 59

## 2015-08-09 VITALS — BP 140/85 | HR 100 | Temp 97.0°F | Resp 18

## 2015-08-09 DIAGNOSIS — C482 Malignant neoplasm of peritoneum, unspecified: Secondary | ICD-10-CM | POA: Diagnosis not present

## 2015-08-09 DIAGNOSIS — A499 Bacterial infection, unspecified: Secondary | ICD-10-CM

## 2015-08-09 DIAGNOSIS — C541 Malignant neoplasm of endometrium: Secondary | ICD-10-CM | POA: Diagnosis not present

## 2015-08-09 DIAGNOSIS — N390 Urinary tract infection, site not specified: Principal | ICD-10-CM

## 2015-08-09 DIAGNOSIS — Z5111 Encounter for antineoplastic chemotherapy: Secondary | ICD-10-CM

## 2015-08-09 MED ORDER — HEPARIN SOD (PORK) LOCK FLUSH 100 UNIT/ML IV SOLN
500.0000 [IU] | Freq: Once | INTRAVENOUS | Status: AC | PRN
  Administered 2015-08-09: 500 [IU]
  Filled 2015-08-09: qty 5

## 2015-08-09 MED ORDER — SODIUM CHLORIDE 0.9 % IV SOLN
Freq: Once | INTRAVENOUS | Status: AC
  Administered 2015-08-09: 11:00:00 via INTRAVENOUS
  Filled 2015-08-09: qty 4

## 2015-08-09 MED ORDER — SODIUM CHLORIDE 0.9 % IJ SOLN
10.0000 mL | INTRAMUSCULAR | Status: DC | PRN
  Administered 2015-08-09: 10 mL
  Filled 2015-08-09: qty 10

## 2015-08-09 MED ORDER — DEXTROSE 5 % IV SOLN
INTRAVENOUS | Status: DC
Start: 2015-08-09 — End: 2015-08-09
  Administered 2015-08-09: 11:00:00 via INTRAVENOUS

## 2015-08-09 MED ORDER — DOXORUBICIN HCL LIPOSOMAL CHEMO INJECTION 2 MG/ML
37.0000 mg/m2 | Freq: Once | INTRAVENOUS | Status: AC
  Administered 2015-08-09: 70 mg via INTRAVENOUS
  Filled 2015-08-09: qty 35

## 2015-08-09 NOTE — Progress Notes (Signed)
Palms of hands and soles of feet placed on ice packs 10 min prior to start of Doxil,  During administration and 10 min post administration per Dr. Mariana Kaufman orders.

## 2015-08-09 NOTE — Patient Instructions (Signed)
[facility] Discharge Instructions for Patients Receiving Chemotherapy  Today you received the following chemotherapy agents: Doxil  To help prevent nausea and vomiting after your treatment, we encourage you to take your nausea medication as directed.  If you develop nausea and vomiting that is not controlled by your nausea medication, call the clinic.   BELOW ARE SYMPTOMS THAT SHOULD BE REPORTED IMMEDIATELY:  *FEVER GREATER THAN 100.5 F  *CHILLS WITH OR WITHOUT FEVER  NAUSEA AND VOMITING THAT IS NOT CONTROLLED WITH YOUR NAUSEA MEDICATION  *UNUSUAL SHORTNESS OF BREATH  *UNUSUAL BRUISING OR BLEEDING  TENDERNESS IN MOUTH AND THROAT WITH OR WITHOUT PRESENCE OF ULCERS  *URINARY PROBLEMS  *BOWEL PROBLEMS  UNUSUAL RASH Items with * indicate a potential emergency and should be followed up as soon as possible.  Feel free to call the clinic you have any questions or concerns. The clinic phone number is [phone].  Please show the CHEMO ALERT CARD at check-in to the Emergency Department and triage nurse.   

## 2015-08-09 NOTE — Progress Notes (Signed)
Nutrition follow-up completed with patient diagnosed with peritoneal carcinomatosis. Patient continues to follow a low residue, low fiber diet. Current weight documented as 160.3 pounds September 15 decreased from 163.8 pounds August 12. Patient reports constipation has improved. Nausea controlled with anti-medics. Patient attempting to add permitted foods for increased variety.  Nutrition diagnosis: Food and nutrition related knowledge deficit improved.  Intervention:  Reviewed, low fiber, low residue diet with patient again and encouraged patient to add allowed foods. Discouraged further weight loss. Provided support and encouragement. Questions were answered.  Teach back method used.  Monitoring, evaluation, goals: Patient will continue to follow a low residue diet as tolerated for minimal weight loss.  Next visit: Monday, October 17, during infusion.  **Disclaimer: This note was dictated with voice recognition software. Similar sounding words can inadvertently be transcribed and this note may contain transcription errors which may not have been corrected upon publication of note.**

## 2015-08-10 LAB — URINE CULTURE

## 2015-08-11 ENCOUNTER — Telehealth: Payer: Self-pay

## 2015-08-11 NOTE — Telephone Encounter (Signed)
-----   Message from Gordy Levan, MD sent at 08/11/2015  2:35 PM EDT ----- Labs seen and need follow up: let her know no UTI

## 2015-08-11 NOTE — Telephone Encounter (Signed)
LM stating the results of urine culture as noted below by Dr. Marko Plume.  Jessica Payne can call the ofice if she has any questions.

## 2015-08-15 ENCOUNTER — Other Ambulatory Visit: Payer: Self-pay | Admitting: Oncology

## 2015-08-15 DIAGNOSIS — C482 Malignant neoplasm of peritoneum, unspecified: Secondary | ICD-10-CM

## 2015-08-19 ENCOUNTER — Telehealth: Payer: Self-pay | Admitting: Oncology

## 2015-08-19 ENCOUNTER — Encounter: Payer: Self-pay | Admitting: Oncology

## 2015-08-19 ENCOUNTER — Ambulatory Visit (HOSPITAL_COMMUNITY)
Admission: RE | Admit: 2015-08-19 | Discharge: 2015-08-19 | Disposition: A | Discharge status: Home / Self Care | Payer: 59 | Source: Ambulatory Visit | Attending: Oncology | Admitting: Oncology

## 2015-08-19 ENCOUNTER — Ambulatory Visit (HOSPITAL_BASED_OUTPATIENT_CLINIC_OR_DEPARTMENT_OTHER): Payer: 59 | Admitting: Oncology

## 2015-08-19 ENCOUNTER — Ambulatory Visit (HOSPITAL_BASED_OUTPATIENT_CLINIC_OR_DEPARTMENT_OTHER): Payer: 59

## 2015-08-19 ENCOUNTER — Other Ambulatory Visit (HOSPITAL_BASED_OUTPATIENT_CLINIC_OR_DEPARTMENT_OTHER): Payer: 59

## 2015-08-19 VITALS — BP 117/64 | HR 100 | Temp 97.9°F | Resp 18

## 2015-08-19 VITALS — BP 126/81 | HR 110 | Temp 97.8°F | Resp 18 | Ht 67.0 in | Wt 151.6 lb

## 2015-08-19 VITALS — BP 132/84 | HR 105 | Temp 96.8°F | Resp 20

## 2015-08-19 DIAGNOSIS — C541 Malignant neoplasm of endometrium: Secondary | ICD-10-CM

## 2015-08-19 DIAGNOSIS — K567 Ileus, unspecified: Secondary | ICD-10-CM | POA: Diagnosis not present

## 2015-08-19 DIAGNOSIS — Z95828 Presence of other vascular implants and grafts: Secondary | ICD-10-CM

## 2015-08-19 DIAGNOSIS — E876 Hypokalemia: Secondary | ICD-10-CM

## 2015-08-19 DIAGNOSIS — C55 Malignant neoplasm of uterus, part unspecified: Secondary | ICD-10-CM

## 2015-08-19 DIAGNOSIS — Z23 Encounter for immunization: Secondary | ICD-10-CM

## 2015-08-19 DIAGNOSIS — Z9889 Other specified postprocedural states: Secondary | ICD-10-CM

## 2015-08-19 DIAGNOSIS — C482 Malignant neoplasm of peritoneum, unspecified: Secondary | ICD-10-CM

## 2015-08-19 DIAGNOSIS — K5909 Other constipation: Secondary | ICD-10-CM

## 2015-08-19 DIAGNOSIS — C786 Secondary malignant neoplasm of retroperitoneum and peritoneum: Secondary | ICD-10-CM

## 2015-08-19 DIAGNOSIS — E860 Dehydration: Secondary | ICD-10-CM

## 2015-08-19 LAB — COMPREHENSIVE METABOLIC PANEL (CC13)
ALK PHOS: 114 U/L (ref 40–150)
ALT: 34 U/L (ref 0–55)
AST: 34 U/L (ref 5–34)
Albumin: 2.8 g/dL — ABNORMAL LOW (ref 3.5–5.0)
Anion Gap: 12 mEq/L — ABNORMAL HIGH (ref 3–11)
BUN: 12.3 mg/dL (ref 7.0–26.0)
CHLORIDE: 96 meq/L — AB (ref 98–109)
CO2: 26 meq/L (ref 22–29)
Calcium: 8.7 mg/dL (ref 8.4–10.4)
Creatinine: 0.8 mg/dL (ref 0.6–1.1)
EGFR: 85 mL/min/{1.73_m2} — AB (ref >90)
GLUCOSE: 96 mg/dL (ref 70–140)
POTASSIUM: 3.2 meq/L — AB (ref 3.5–5.1)
SODIUM: 134 meq/L — AB (ref 136–145)
Total Bilirubin: 0.73 mg/dL (ref 0.20–1.20)
Total Protein: 6.5 g/dL (ref 6.4–8.3)

## 2015-08-19 LAB — CBC WITH DIFFERENTIAL/PLATELET
BASO%: 0.4 % (ref 0.0–2.0)
BASOS ABS: 0 10*3/uL (ref 0.0–0.1)
EOS ABS: 0 10*3/uL (ref 0.0–0.5)
EOS%: 0.4 % (ref 0.0–7.0)
HCT: 34 % — ABNORMAL LOW (ref 34.8–46.6)
HGB: 11.7 g/dL (ref 11.6–15.9)
LYMPH%: 10.1 % — AB (ref 14.0–49.7)
MCH: 30.5 pg (ref 25.1–34.0)
MCHC: 34.4 g/dL (ref 31.5–36.0)
MCV: 88.8 fL (ref 79.5–101.0)
MONO#: 0.4 10*3/uL (ref 0.1–0.9)
MONO%: 3.8 % (ref 0.0–14.0)
NEUT#: 8.6 10*3/uL — ABNORMAL HIGH (ref 1.5–6.5)
NEUT%: 85.3 % — ABNORMAL HIGH (ref 38.4–76.8)
Platelets: 209 10*3/uL (ref 145–400)
RBC: 3.83 10*6/uL (ref 3.70–5.45)
RDW: 15.5 % — ABNORMAL HIGH (ref 11.2–14.5)
WBC: 10.1 10*3/uL (ref 3.9–10.3)
lymph#: 1 10*3/uL (ref 0.9–3.3)

## 2015-08-19 MED ORDER — INFLUENZA VAC SPLIT QUAD 0.5 ML IM SUSY
0.5000 mL | PREFILLED_SYRINGE | Freq: Once | INTRAMUSCULAR | Status: AC
  Administered 2015-08-19: 0.5 mL via INTRAMUSCULAR
  Filled 2015-08-19: qty 0.5

## 2015-08-19 MED ORDER — HEPARIN SOD (PORK) LOCK FLUSH 100 UNIT/ML IV SOLN
500.0000 [IU] | Freq: Once | INTRAVENOUS | Status: AC
  Administered 2015-08-19: 500 [IU] via INTRAVENOUS
  Filled 2015-08-19: qty 5

## 2015-08-19 MED ORDER — SODIUM CHLORIDE 0.9 % IJ SOLN
10.0000 mL | Freq: Once | INTRAMUSCULAR | Status: AC
  Administered 2015-08-19: 10 mL via INTRAVENOUS
  Filled 2015-08-19: qty 10

## 2015-08-19 MED ORDER — SODIUM CHLORIDE 0.9 % IJ SOLN
10.0000 mL | INTRAMUSCULAR | Status: DC | PRN
  Administered 2015-08-19: 10 mL via INTRAVENOUS
  Filled 2015-08-19: qty 10

## 2015-08-19 MED ORDER — SODIUM CHLORIDE 0.9 % IV SOLN
Freq: Once | INTRAVENOUS | Status: AC
  Administered 2015-08-19: 13:00:00 via INTRAVENOUS
  Filled 2015-08-19: qty 1000

## 2015-08-19 NOTE — Progress Notes (Signed)
OFFICE PROGRESS NOTE   August 19, 2015   Physicians:Emma Glade Stanford (PCP)  INTERVAL HISTORY:  Patient is seen, together with sister, in follow up of progressive primary peritoneal carcinoma, for which she had second cycle of doxil on 08-09-15. She continues to have problems with GI symptoms related to the disease.   Patient has had a few days of multiple small soft bowel movements each day, during which time she did not vomit and felt much better in abdomen. Without discussing with this office,  she then took 2 imodium, subsequently feeling worse. We have discussed not using any imodium. She has not used miralax daily. She has vomiting and more abdominal discomfort when bowels do not move. Last vomiting was last PM, green liquid, did not include undigested food. She has abdominal pain frequently after po's, including after chicken noodle soup, per sister "has to curl up on couch".  No fever. No increased SOB. No skin irritation with doxil. No bladder symptoms.    PAC by IR 07-08-15 Referral for genetics counseling now Pre-operative CA 125 1941 on 01-07-15 Flu vaccine given today  ONCOLOGIC HISTORY Patient had not had regular medical care since she lost job and medical insurance. She had progressive abdominal swelling over ~ 6 months as well as some postmenopausal bleeding when she was admitted to [facility] in 11-2014 with abdominal pain and presumed peritonitis, initially thought to be either gall bladder related or cirrhosis. Initial paracentesis 12-11-2014 was for 3.5 liters, with malignant cytology consistent with gyn malignancy. Notes refer to CT and MRI "which demonstrated omental thickening, extensive ascites, slight enlargement of the right adnexa and a thickened endometrium (1.1 cm)." She was referred to Dr Josephina Shih, seen on 12-25-14 with endometrial biopsy not possible due to cervical stenosis. She had second paracentesis for 4.2 liters on 01-04-15. CA  125 on 01-07-15 was 1941 (pre op).She had TAH BSO, omentectomy and radical debulking by Dr Denman George on 01-12-15, with 4 liters of ascites at time of surgery. Findings at surgery were of milial tumor over entire small bowel serosa, mesentery, diaphragm, plaque on bladder and involving cul de sac peritoneum. Surgery was optimal R1 cytoreduction. Pathology [phone]) demonstrated high grade serous carcinoma, IIIC primary peritoneal and at least IB endometrial.. Recommendation was for 6 cycles of carboplatin and taxol, then likely whole pelvic RT + vaginal brachytherapy if she has CR. US paracentesis 01-29-15 for 1.5 liters. She began dose dense carboplatin taxol on 02-04-15, given thru day 1 cycle 6 on 05-27-15 (taxol held cycle 6 due to peripheral neuropathy). She was found to have progressive disease within 4 weeks of completing carbo taxol, when admitted with SBO early 06-2015. She began salvage doxil on 07-12-15.     Review of systems as above, also: No problems with PAC. No bleeding. Fatigues with much exertion. Remainder of 10 point Review of Systems negative.  Objective:  Vital signs in last 24 hours:  BP 126/81 mmHg  Pulse 110  Temp(Src) 97.8 F (36.6 C) (Oral)  Resp 18  Ht 5' 7"  (1.702 m)  Wt 151 lb 9.6 oz (68.765 kg)  BMI 23.74 kg/m2  SpO2 98% Weight down 9 lbs from 08-05-15 Alert, oriented and appropriate. Ambulatory without assistance, able to get on and off exam table. Looks unwell but not in acute distress. Respirations not labored RA with exertion in exam room. Alopecia  HEENT:PERRL, sclerae not icteric. Oral mucosa dry without lesions, posterior pharynx clear but dry.  Neck supple. No JVD.  Lymphatics:no  cervical,supraclavicular adenopathy Resp: diminished BS thruout otherwise clear to auscultation bilaterally and normal percussion bilaterally Cardio: regular rate and rhythm. No gallop. GI: soft, nontender,full,  mildly distended not tight, no organomegaly. Quiet Musculoskeletal/  Extremities: without pitting edema, cords, tenderness Neuro: no change peripheral neuropathy. Otherwise nonfocal. PSYCH appropriate mood and affect Skin without rash, ecchymosis, petechiae Portacath-without erythema or tenderness  Lab Results:  Results for orders placed or performed in visit on 08/19/15  CBC with Differential  Result Value Ref Range   WBC 10.1 3.9 - 10.3 10e3/uL   NEUT# 8.6 (H) 1.5 - 6.5 10e3/uL   HGB 11.7 11.6 - 15.9 g/dL   HCT 34.0 (L) 34.8 - 46.6 %   Platelets 209 145 - 400 10e3/uL   MCV 88.8 79.5 - 101.0 fL   MCH 30.5 25.1 - 34.0 pg   MCHC 34.4 31.5 - 36.0 g/dL   RBC 3.83 3.70 - 5.45 10e6/uL   RDW 15.5 (H) 11.2 - 14.5 %   lymph# 1.0 0.9 - 3.3 10e3/uL   MONO# 0.4 0.1 - 0.9 10e3/uL   Eosinophils Absolute 0.0 0.0 - 0.5 10e3/uL   Basophils Absolute 0.0 0.0 - 0.1 10e3/uL   NEUT% 85.3 (H) 38.4 - 76.8 %   LYMPH% 10.1 (L) 14.0 - 49.7 %   MONO% 3.8 0.0 - 14.0 %   EOS% 0.4 0.0 - 7.0 %   BASO% 0.4 0.0 - 2.0 %  Comprehensive metabolic panel (Cmet) - [facility]  Result Value Ref Range   Sodium 134 (L) 136 - 145 mEq/L   Potassium 3.2 (L) 3.5 - 5.1 mEq/L   Chloride 96 (L) 98 - 109 mEq/L   CO2 26 22 - 29 mEq/L   Glucose 96 70 - 140 mg/dl   BUN 12.3 7.0 - 26.0 mg/dL   Creatinine 0.8 0.6 - 1.1 mg/dL   Total Bilirubin 0.73 0.20 - 1.20 mg/dL   Alkaline Phosphatase 114 40 - 150 U/L   AST 34 5 - 34 U/L   ALT 34 0 - 55 U/L   Total Protein 6.5 6.4 - 8.3 g/dL   Albumin 2.8 (L) 3.5 - 5.0 g/dL   Calcium 8.7 8.4 - 10.4 mg/dL   Anion Gap 12 (H) 3 - 11 mEq/L   EGFR 85 (L) >90 ml/min/1.73 m2    Urine culture 08-09-15 no growth  Note CA 125 often does not improve quickly on doxil.  Studies/Results: ABDOMEN - 2 VIEW  08-19-15 COMPARISON: [facility] of July 27, 2015  FINDINGS: There are numerous loops of minimally distended gas-filled small bowel throughout the abdomen. No free extraluminal gas collections are observed. A normal stool and gas pattern within the colon and rectum  is demonstrated. There surgical clips within the pelvis. The lung bases are clear. There are degenerative disc changes at multiple lumbar levels. The bones are diffusely osteopenic.  IMPRESSION: Small bowel ileus or partial distal small bowel obstruction. There is no evidence of perforation.  Medications: I have reviewed the patient's current medications. She is not taking Reglan. She will use 1/2 cap of miralax if bowels have not moved in 8 hrs. No imodium or lomotil.  1 liter IV NS with 20 KCl given now. Flu vaccine given today  DISCUSSION Abd Xray done, patient seen again in infusion after imaging, 1 liter NS with K given today.  Miralax as above, again recommended liquids in preference to solid foods, no imodium, (no reglan). She will call this office next week to let us know how she is.  I have told patient and sister that symptoms are from the gyn cancer.  I have recommended genetics counseling, which she would like to do. Referral made.  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 Progressed around completion of adjuvant carbo taxol, so is platinum resistant/ refractory. Salvage doxil begun 07-12-15. Ileus, partial SBO symptoms intermittently as above. I will see her in ~ 2 weeks, cycle 3 doxil then. If no improvement after 3 cycles doxil will need to change plan. She is not candidate for avastin with SOB. Referral to genetics 2.SBO during recent hospitalization and self-limited symptoms before and since the hospitalization. Ileus. Should not use medications that slow bowel motility. 3. PAC placed by IR 4.low residue/ liquid/very soft diet due to recent SBO complications.  Appreciate assistance from dietician. 5.Hypo K supplemented IV now, likely from multiple bowel movements 6.long past tobacco, COPD 7. Iron deficiency + chemo anemia: post IV feraheme. Improved on recent chemo break. 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  9. Post partial thyroidectomy for goiter ~ 1980, on medication until ~ 1 year ago. PCP managing. 10. No advance directives per EMR 11.flu vaccine done 08-19-15  All questions answered.  Time spent 35 min including >50% counseling and coordination of care.    LIVESAY,LENNIS P, MD   08/19/2015, 1:03 PM

## 2015-08-19 NOTE — Patient Instructions (Signed)

## 2015-08-19 NOTE — Telephone Encounter (Signed)
Spoke with patient,she is still in chemo and will get an avs when she leaves

## 2015-08-21 ENCOUNTER — Other Ambulatory Visit: Payer: Self-pay | Admitting: Oncology

## 2015-08-21 DIAGNOSIS — C482 Malignant neoplasm of peritoneum, unspecified: Secondary | ICD-10-CM

## 2015-08-27 ENCOUNTER — Telehealth: Payer: Self-pay

## 2015-08-27 NOTE — Telephone Encounter (Signed)
-----   Message from Gordy Levan, MD sent at 08/21/2015  9:41 AM EDT ----- I asked pt to let us know how she is doing next week  I see no advance directives. Please ask if she would be willing to talk with SW about these, or if she has other questions or concerns. Need to be sure she receives information at next office apt and I will try to address also.  thanks

## 2015-08-27 NOTE — Telephone Encounter (Signed)
Ms. Jessica Payne called stating that she continues to have loose/diarrheal stools since 08-19-15.  She is using Miralax ~1/2 a cap q 8 hrs if bowels not moving.   She continues to experience stomach pain every time she eats or drinks something. Using Ibuprofen and Oxycodone occasionally. She had a low temp of 100.3 Wednesday night and took  Tylenol.  Temp on 08-26-15 is 97.7. She did eat some chicken leg and rice las evening and tolerated it.  She was so hungry the food tasted good. She will discuss advanced directives with her mother and sister-in-law prior to coming in on 09-06-15.   Information reviewed with Dr. Marko Plume. Told Ms. Jessica Payne that Dr. Marko Plume was glad that her bowels have been moving as she feels better when they keep moving.  Ms. Jessica Payne agreed. She will keep appointment on 09-06-15.  She knows to call the [facility] if any concerns or questions prior to the visit.

## 2015-09-01 ENCOUNTER — Telehealth: Payer: Self-pay

## 2015-09-01 ENCOUNTER — Other Ambulatory Visit: Payer: Self-pay | Admitting: Oncology

## 2015-09-01 NOTE — Telephone Encounter (Signed)
Gave information to Ms. Boutin regarding the [employer] program on 09-08-15 [employer].  She will review informationwith her grand children's parents.

## 2015-09-06 ENCOUNTER — Ambulatory Visit (HOSPITAL_BASED_OUTPATIENT_CLINIC_OR_DEPARTMENT_OTHER): Payer: 59

## 2015-09-06 ENCOUNTER — Encounter: Payer: Self-pay | Admitting: Oncology

## 2015-09-06 ENCOUNTER — Ambulatory Visit: Payer: 59

## 2015-09-06 ENCOUNTER — Ambulatory Visit (HOSPITAL_BASED_OUTPATIENT_CLINIC_OR_DEPARTMENT_OTHER): Payer: 59 | Admitting: Oncology

## 2015-09-06 ENCOUNTER — Other Ambulatory Visit (HOSPITAL_COMMUNITY)
Admission: AD | Admit: 2015-09-06 | Discharge: 2015-09-06 | Disposition: A | Discharge status: Home / Self Care | Payer: 59 | Source: Ambulatory Visit | Attending: Oncology | Admitting: Oncology

## 2015-09-06 ENCOUNTER — Encounter: Payer: Self-pay | Admitting: *Deleted

## 2015-09-06 ENCOUNTER — Other Ambulatory Visit (HOSPITAL_BASED_OUTPATIENT_CLINIC_OR_DEPARTMENT_OTHER): Payer: 59

## 2015-09-06 ENCOUNTER — Ambulatory Visit (HOSPITAL_BASED_OUTPATIENT_CLINIC_OR_DEPARTMENT_OTHER): Payer: 59 | Admitting: Genetic Counselor

## 2015-09-06 ENCOUNTER — Ambulatory Visit: Payer: 59 | Admitting: Nutrition

## 2015-09-06 ENCOUNTER — Encounter: Payer: 59 | Admitting: Nutrition

## 2015-09-06 ENCOUNTER — Encounter: Payer: Self-pay | Admitting: Genetic Counselor

## 2015-09-06 VITALS — BP 127/95 | HR 91 | Temp 97.6°F | Resp 18 | Ht 67.0 in | Wt 144.2 lb

## 2015-09-06 DIAGNOSIS — R634 Abnormal weight loss: Secondary | ICD-10-CM

## 2015-09-06 DIAGNOSIS — G620 Drug-induced polyneuropathy: Secondary | ICD-10-CM

## 2015-09-06 DIAGNOSIS — A047 Enterocolitis due to Clostridium difficile: Secondary | ICD-10-CM

## 2015-09-06 DIAGNOSIS — R197 Diarrhea, unspecified: Secondary | ICD-10-CM

## 2015-09-06 DIAGNOSIS — Z800 Family history of malignant neoplasm of digestive organs: Secondary | ICD-10-CM

## 2015-09-06 DIAGNOSIS — D509 Iron deficiency anemia, unspecified: Secondary | ICD-10-CM

## 2015-09-06 DIAGNOSIS — R180 Malignant ascites: Secondary | ICD-10-CM

## 2015-09-06 DIAGNOSIS — C541 Malignant neoplasm of endometrium: Secondary | ICD-10-CM | POA: Diagnosis not present

## 2015-09-06 DIAGNOSIS — N950 Postmenopausal bleeding: Secondary | ICD-10-CM

## 2015-09-06 DIAGNOSIS — C482 Malignant neoplasm of peritoneum, unspecified: Secondary | ICD-10-CM

## 2015-09-06 DIAGNOSIS — C481 Malignant neoplasm of specified parts of peritoneum: Secondary | ICD-10-CM

## 2015-09-06 DIAGNOSIS — R531 Weakness: Secondary | ICD-10-CM

## 2015-09-06 DIAGNOSIS — Z801 Family history of malignant neoplasm of trachea, bronchus and lung: Secondary | ICD-10-CM | POA: Diagnosis not present

## 2015-09-06 DIAGNOSIS — C530 Malignant neoplasm of endocervix: Secondary | ICD-10-CM

## 2015-09-06 DIAGNOSIS — D6481 Anemia due to antineoplastic chemotherapy: Secondary | ICD-10-CM

## 2015-09-06 DIAGNOSIS — R1084 Generalized abdominal pain: Secondary | ICD-10-CM

## 2015-09-06 DIAGNOSIS — A0472 Enterocolitis due to Clostridium difficile, not specified as recurrent: Secondary | ICD-10-CM

## 2015-09-06 DIAGNOSIS — Z315 Encounter for genetic counseling: Secondary | ICD-10-CM

## 2015-09-06 DIAGNOSIS — N390 Urinary tract infection, site not specified: Secondary | ICD-10-CM

## 2015-09-06 DIAGNOSIS — Z95828 Presence of other vascular implants and grafts: Secondary | ICD-10-CM

## 2015-09-06 LAB — CBC WITH DIFFERENTIAL/PLATELET
BASO%: 0.3 % (ref 0.0–2.0)
Basophils Absolute: 0 10*3/uL (ref 0.0–0.1)
EOS%: 0 % (ref 0.0–7.0)
Eosinophils Absolute: 0 10*3/uL (ref 0.0–0.5)
HEMATOCRIT: 33.1 % — AB (ref 34.8–46.6)
HEMOGLOBIN: 11 g/dL — AB (ref 11.6–15.9)
LYMPH#: 1.5 10*3/uL (ref 0.9–3.3)
LYMPH%: 13.2 % — ABNORMAL LOW (ref 14.0–49.7)
MCH: 29.2 pg (ref 25.1–34.0)
MCHC: 33.2 g/dL (ref 31.5–36.0)
MCV: 87.8 fL (ref 79.5–101.0)
MONO#: 1.8 10*3/uL — ABNORMAL HIGH (ref 0.1–0.9)
MONO%: 15.8 % — ABNORMAL HIGH (ref 0.0–14.0)
NEUT%: 70.7 % (ref 38.4–76.8)
NEUTROS ABS: 8.1 10*3/uL — AB (ref 1.5–6.5)
Platelets: 306 10*3/uL (ref 145–400)
RBC: 3.77 10*6/uL (ref 3.70–5.45)
RDW: 14.9 % — AB (ref 11.2–14.5)
WBC: 11.4 10*3/uL — AB (ref 3.9–10.3)

## 2015-09-06 LAB — COMPREHENSIVE METABOLIC PANEL (CC13)
ALT: 23 U/L (ref 0–55)
AST: 33 U/L (ref 5–34)
Albumin: 2.5 g/dL — ABNORMAL LOW (ref 3.5–5.0)
Alkaline Phosphatase: 119 U/L (ref 40–150)
Anion Gap: 14 mEq/L — ABNORMAL HIGH (ref 3–11)
BUN: 10.4 mg/dL (ref 7.0–26.0)
CALCIUM: 8.2 mg/dL — AB (ref 8.4–10.4)
CHLORIDE: 96 meq/L — AB (ref 98–109)
CO2: 26 meq/L (ref 22–29)
Creatinine: 0.7 mg/dL (ref 0.6–1.1)
EGFR: >90 mL/min/{1.73_m2} (ref >90)
GLUCOSE: 100 mg/dL (ref 70–140)
POTASSIUM: 3.5 meq/L (ref 3.5–5.1)
Sodium: 135 mEq/L — ABNORMAL LOW (ref 136–145)
Total Bilirubin: 0.78 mg/dL (ref 0.20–1.20)
Total Protein: 6.3 g/dL — ABNORMAL LOW (ref 6.4–8.3)

## 2015-09-06 LAB — C DIFFICILE QUICK SCREEN W PCR REFLEX
C DIFFICLE (CDIFF) ANTIGEN: POSITIVE — AB
C Diff toxin: NEGATIVE

## 2015-09-06 MED ORDER — SODIUM CHLORIDE 0.9 % IJ SOLN
10.0000 mL | INTRAMUSCULAR | Status: DC | PRN
  Administered 2015-09-06: 10 mL via INTRAVENOUS
  Filled 2015-09-06: qty 10

## 2015-09-06 MED ORDER — METRONIDAZOLE 500 MG PO TABS: 500.0000 mg | ORAL_TABLET | Freq: Three times a day (TID) | ORAL | Status: DC

## 2015-09-06 MED ORDER — SODIUM CHLORIDE 0.9 % IV SOLN
1000.0000 mL | Freq: Once | INTRAVENOUS | Status: AC
  Administered 2015-09-06: 1000 mL via INTRAVENOUS

## 2015-09-06 MED ORDER — OXYCODONE HCL 5 MG PO TABS: 5.0000 mg | ORAL_TABLET | ORAL | Status: DC | PRN

## 2015-09-06 NOTE — Progress Notes (Signed)
Patient reports she has really struggled since I last saw her. Weight decreased significantly documented as 144.2 pounds October 17, down from 160.3 pounds September 15. Patient reports loose stools. Patient noted diarrhea after consuming cream of broccoli soup. Patient does not tolerate oral nutrition supplements.  Nutrition diagnosis: Food and nutrition related knowledge deficit continues.  Intervention: Educated patient to avoid large amounts of lactose and gas containing foods like broccoli. Reviewed foods patient should try to incorporate on a regular basis and provided options for cream soups. Questions were answered.  Teach back method used.  Monitoring, evaluation, goals: Patient will work to increase calories and protein to minimize further weight loss.  Next visit: I will continue to work with patient.  She has my contact information.  **Disclaimer: This note was dictated with voice recognition software. Similar sounding words can inadvertently be transcribed and this note may contain transcription errors which may not have been corrected upon publication of note.**

## 2015-09-06 NOTE — Progress Notes (Signed)
REFERRING PROVIDER: Leanna Battles, MD [place], [place] [address]  Evlyn Clines, MD  PRIMARY PROVIDER:  Donnajean Lopes, MD  PRIMARY REASON FOR VISIT:  1. Endometrial cancer ([place])   2. 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 Family history of colon cancer      HISTORY OF PRESENT ILLNESS:   Ms. Kalp, a 64 y.o. female, was seen for a [facility] cancer genetics consultation at the request of Dr. Philip Aspen due to a personal and family history of cancer.  Ms. Lamia presents to clinic today to discuss the possibility of a hereditary predisposition to cancer, genetic testing, and to further clarify her future cancer risks, as well as potential cancer risks for family members.   In 2016, at the age of 43, Ms. Boileau was diagnosed with uterine and primary peritoneal cancer. This was treated with TAH-BSO and chemotherapy.   CANCER HISTORY:   No history exists.     HORMONAL RISK FACTORS:  Menarche was at age 6-13.  First live birth at age 83.  OCP use for approximately 1 years.  Ovaries intact: no.  Hysterectomy: yes.  Menopausal status: postmenopausal.  HRT use: 0 years. Colonoscopy: no; not examined. Mammogram within the last year: no. Number of breast biopsies: 0. Up to date with pelvic exams:  yes. Any excessive radiation exposure in the past:  no  Past Medical History  Diagnosis Date  . Hypertension   . Acute peritonitis ([place])   . Ascites   . Low TSH level   . Hypothyroidism     1981   . Hyperthyroidism     03/2014   . GERD (gastroesophageal reflux disease)   . Headache     occasional migraine   . Ovarian cancer ([place])   . Uterine cancer [facility])     Past Surgical History  Procedure Laterality Date  . Thyroidectomy    . Culposcopy     . Laparotomy N/A 01/12/2015    Procedure: EXPLORATORY LAPAROTOMY, RADICAL DEBULKING;  Surgeon: Everitt Amber, MD;  Location: [facility];  Service: Gynecology;  Laterality: N/A;  . Abdominal hysterectomy N/A  01/12/2015    Procedure: TOTAL HYSTERECTOMY ABDOMINAL;  Surgeon: Everitt Amber, MD;  Location: [facility];  Service: Gynecology;  Laterality: N/A;  . Salpingoophorectomy Bilateral 01/12/2015    Procedure: BILATERAL SALPINGO OOPHORECTOMY;  Surgeon: Everitt Amber, MD;  Location: [facility];  Service: Gynecology;  Laterality: Bilateral;  . Omentectomy N/A 01/12/2015    Procedure: OMENTECTOMY;  Surgeon: Everitt Amber, MD;  Location: [facility];  Service: Gynecology;  Laterality: N/A;    Social History   Social History  . Marital Status: Married    Spouse Name: N/A  . Number of Children: 1  . Years of Education: N/A   Social History Main Topics  . Smoking status: Former Smoker -- 1.00 packs/day for 20 years    Types: Cigarettes    Quit date: 08/20/2014  . Smokeless tobacco: Never Used  . Alcohol Use: No  . Drug Use: No  . Sexual Activity: Not Currently   Other Topics Concern  . None   Social History Narrative     FAMILY HISTORY:  We obtained a detailed, 4-generation family history.  Significant diagnoses are listed below: Family History  Problem Relation Age of Onset  . Colon cancer Maternal Uncle     dx in his 3s-60s  . Lung cancer Paternal Uncle     smoker  . Brain cancer Cousin     dx in her 42s-30s  Ms. Savary has one child and one sister, both are cancer free.  Her parents are alive at 22 and 57.  Her mother had four brothers and two sisters.  One brother had colon cancer in his late 63s-early 60s.  Her grandparents did not have cancer.  Her dad had two sisters and two brothers.  One brother was a smoker and died of lung cancer.  She has a paternal cousin who had a brain tumor in her 40s-30s,  There is no other reported family history of cancer.  Patient's maternal ancestors are of [place] descent, and paternal ancestors are of [place] descent. There is no reported Ashkenazi Jewish ancestry. There is no known consanguinity.  GENETIC COUNSELING ASSESSMENT: Melva Faux is a 64 y.o.  female with a personal and family history of cancer which somewhat suggestive of a hereditary cancer syndrome and predisposition to cancer. We, therefore, discussed and recommended the following at today's visit.   DISCUSSION: We discussed that individuals with ovarian cancer are at risk for hereditary cancer syndromes, most commonly from BRCA mutations, but sometimes from Lynch syndrome mutations.  Based on her diagnosis of uterine cancer and family history of colon cancer we discussed that this would be more consistent with Lynch syndrome.  We reviewed the characteristics, features and inheritance patterns of hereditary cancer syndromes. We also discussed genetic testing, including the appropriate family members to test, the process of testing, insurance coverage and turn-around-time for results. We discussed the implications of a negative, positive and/or variant of uncertain significant result. We recommended Ms. Heinke pursue genetic testing for the custom gene panel with a combination of genes from the Breast/Ovarian cancer gene panel and the GYN gene panel.The custom gene panel offered by [facility] includes sequencing and rearrangement analysis for the following 22 genes:  ATM, BARD1, BRCA1, BRCA2, BRIP1, CDH1, CHEK2, EPCAM, FANCC, MLH1, MSH2, MSH6, MUTYH, NBN, PALB2, PMS2, POLD1, PTEN, RAD51C, RAD51D, TP53, and XRCC2.      Based on Ms. Piascik's personal and family history of cancer, she meets medical criteria for genetic testing. Despite that she meets criteria, she may still have an out of pocket cost. We discussed that if her out of pocket cost for testing is over $100, the laboratory will call and confirm whether she wants to proceed with testing.  If the out of pocket cost of testing is less than $100 she will be billed by the genetic testing laboratory.   PLAN: After considering the risks, benefits, and limitations, Ms. Devereux  provided informed consent to pursue genetic testing and the blood sample  was sent to [facility] for analysis of the Custom gene panel. Results should be available within approximately 2-3 weeks' time, at which point they will be disclosed by telephone to Ms. Rosato, as will any additional recommendations warranted by these results. Ms. Mannor will receive a summary of her genetic counseling visit and a copy of her results once available. This information will also be available in Epic. We encouraged Ms. Quest to remain in contact with cancer genetics annually so that we can continuously update the family history and inform her of any changes in cancer genetics and testing that may be of benefit for her family. Ms. Gubser questions were answered to her satisfaction today. Our contact information was provided should additional questions or concerns arise.  Lastly, we encouraged Ms. Belk to remain in contact with cancer genetics annually so that we can continuously update the family history and inform her of any changes in  cancer genetics and testing that may be of benefit for this family.   Ms.  Khader questions were answered to her satisfaction today. Our contact information was provided should additional questions or concerns arise. Thank you for the referral and allowing [place] to share in the care of your patient.   Karen P. Florene Glen, [place], [facility] Certified Genetic Counselor [email or web address] phone: [phone]  The patient was seen for a total of 60 minutes in face-to-face genetic counseling.  This patient was discussed with Drs. Magrinat, Lindi Adie and/or Burr Medico who agrees with the above.    _______________________________________________________________________ For Office Staff:  Number of people involved in session: 2 Was an Intern/ student involved with case: no

## 2015-09-06 NOTE — Progress Notes (Unsigned)
Critical result reported to Dr Marko Plume 09/06/15 1630 by Prudencio Burly

## 2015-09-06 NOTE — Progress Notes (Signed)
Reviewed home  enteric precautions with Jessica Payne as her stool specimen given today  is positive for C. Diff. Flagyl called in to her pharmacy.

## 2015-09-06 NOTE — Patient Instructions (Signed)

## 2015-09-06 NOTE — Progress Notes (Signed)
OFFICE PROGRESS NOTE   September 06, 2015   Physicians:Emma Glade Stanford (PCP)  INTERVAL HISTORY:  Patient is seen, together with sister, in ongoing follow up of progressive primary peritoneal carcinoma. She is due cycle 3 doxil today, however is not able to proceed with that due to increased weakness, abdominal pain after any po's and diarrhea. Last doxil was cycle 2 on 08-09-15. Last imaging was CT AP 06-26-15.   Patient had previously been constipated, however from last visit on 08-19-15 has needed miralax only once, as she has instead been having diarrhea. She reports constant bowel movements from 6 PM to MN yesterday, up to 15x daily. She has severe cramping pain in epigastrium and mid abdomen with any po's, such that she is not eating or drinking adequately. She denies SOB or fever. Stools are tan. She has not used any pain medication for the abdominal pain (since we had told her to stop this with previous constipation). She vomits just occasionally.    PAC by IR 07-08-15 Genetics counseling scheduled for today Pre-operative CA 125 1941 on 01-07-15 Flu vaccine given today  Sister very supportive, also crying at times.  ONCOLOGIC HISTORY Patient had not had regular medical care since she lost job and medical insurance. She had progressive abdominal swelling over ~ 6 months as well as some postmenopausal bleeding when she was admitted to [facility] in 11-2014 with abdominal pain and presumed peritonitis, initially thought to be either gall bladder related or cirrhosis. Initial paracentesis 12-11-2014 was for 3.5 liters, with malignant cytology consistent with gyn malignancy. Notes refer to CT and MRI "which demonstrated omental thickening, extensive ascites, slight enlargement of the right adnexa and a thickened endometrium (1.1 cm)." She was referred to Dr Josephina Shih, seen on 12-25-14 with endometrial biopsy not possible due to cervical stenosis. She had second  paracentesis for 4.2 liters on 01-04-15. CA 125 on 01-07-15 was 1941 (pre op).She had TAH BSO, omentectomy and radical debulking by Dr Denman George on 01-12-15, with 4 liters of ascites at time of surgery. Findings at surgery were of milial tumor over entire small bowel serosa, mesentery, diaphragm, plaque on bladder and involving cul de sac peritoneum. Surgery was optimal R1 cytoreduction. Pathology [phone]) demonstrated high grade serous carcinoma, IIIC primary peritoneal and at least IB endometrial.. Recommendation was for 6 cycles of carboplatin and taxol, then likely whole pelvic RT + vaginal brachytherapy if she has CR. US paracentesis 01-29-15 for 1.5 liters. She began dose dense carboplatin taxol on 02-04-15, given thru day 1 cycle 6 on 05-27-15 (taxol held cycle 6 due to peripheral neuropathy). She was found to have progressive disease within 4 weeks of completing carbo taxol, when admitted with SBO early 06-2015. She began salvage doxil on 07-12-15    Review of systems as above, also: No problems with PAC. No swelling LE. Is voiding some.  Remainder of 10 point Review of Systems negative.  Objective:  Vital signs in last 24 hours:  BP 127/95 mmHg  Pulse 91  Temp(Src) 97.6 F (36.4 C) (Oral)  Resp 18  Ht 5' 7"  (1.702 m)  Wt 144 lb 3.2 oz (65.409 kg)  BMI 22.58 kg/m2  SpO2 96% Weight down 7 lbs from 08-19-15 Alert, oriented and appropriate. Ambulatory without assistance. Respirations not labored Therapist, sports. Alopecia. Looks fatigued and unwell but not in acute distress. Tearful at times as we discuss prognosis and advance directives.  HEENT:PERRL, sclerae not icteric. Oral mucosa somewhat dry without lesions, posterior pharynx clear.  Neck supple. No JVD.  Lymphatics:no cervical,supraclavicular or inguinal adenopathy Resp: clear to auscultation bilaterally and normal percussion bilaterally Cardio: regular rate and rhythm. No gallop. GI: soft, nontender, not distended, no mass or organomegaly. Normally  active bowel sounds. Surgical incision not remarkable. Musculoskeletal/ Extremities: without pitting edema, cords, tenderness Neuro: no change peripheral neuropathy. Otherwise nonfocal. PSYCH tearful as above, appropriate Skin without rash, ecchymosis, petechiae Portacath-without erythema or tenderness  Lab Results:  Results for orders placed or performed in visit on 09/06/15  CBC with Differential  Result Value Ref Range   WBC 11.4 (H) 3.9 - 10.3 10e3/uL   NEUT# 8.1 (H) 1.5 - 6.5 10e3/uL   HGB 11.0 (L) 11.6 - 15.9 g/dL   HCT 33.1 (L) 34.8 - 46.6 %   Platelets 306 145 - 400 10e3/uL   MCV 87.8 79.5 - 101.0 fL   MCH 29.2 25.1 - 34.0 pg   MCHC 33.2 31.5 - 36.0 g/dL   RBC 3.77 3.70 - 5.45 10e6/uL   RDW 14.9 (H) 11.2 - 14.5 %   lymph# 1.5 0.9 - 3.3 10e3/uL   MONO# 1.8 (H) 0.1 - 0.9 10e3/uL   Eosinophils Absolute 0.0 0.0 - 0.5 10e3/uL   Basophils Absolute 0.0 0.0 - 0.1 10e3/uL   NEUT% 70.7 38.4 - 76.8 %   LYMPH% 13.2 (L) 14.0 - 49.7 %   MONO% 15.8 (H) 0.0 - 14.0 %   EOS% 0.0 0.0 - 7.0 %   BASO% 0.3 0.0 - 2.0 %  Comprehensive metabolic panel (Cmet) - [facility]  Result Value Ref Range   Sodium 135 (L) 136 - 145 mEq/L   Potassium 3.5 3.5 - 5.1 mEq/L   Chloride 96 (L) 98 - 109 mEq/L   CO2 26 22 - 29 mEq/L   Glucose 100 70 - 140 mg/dl   BUN 10.4 7.0 - 26.0 mg/dL   Creatinine 0.7 0.6 - 1.1 mg/dL   Total Bilirubin 0.78 0.20 - 1.20 mg/dL   Alkaline Phosphatase 119 40 - 150 U/L   AST 33 5 - 34 U/L   ALT 23 0 - 55 U/L   Total Protein 6.3 (L) 6.4 - 8.3 g/dL   Albumin 2.5 (L) 3.5 - 5.0 g/dL   Calcium 8.2 (L) 8.4 - 10.4 mg/dL   Anion Gap 14 (H) 3 - 11 mEq/L   EGFR >90 >90 ml/min/1.73 m2    CA 125 available after visit 5397, this having been 3283 on 07-27-15 and 708 on 06-26-15  C diff resulted following visit with positive antigen tho negative toxin, interpretation: C. difficile present, but toxin not detected. This indicates colonization. In most cases, this does not require treatment. If  patient has signs and symptoms consistent with colitis, consider treatment. Requires ENTERIC precautions.  Studies/Results:  No results found.  Medications: I have reviewed the patient's current medications. Will treat with Flagyl 500 mg tid x 14 days due to symptoms.  DISCUSSION Prior to C diff resulting, we have discussed need to hold chemo today due to worsening of condition, likely related to cancer tho we need to rule out other cause of the diarrhea. WIll give IVF today. OK to resume prn oxyIR. We will let her know results of marker pending.   Discussed advance directives. Patient understands that resuscitation and life support would not benefit in setting of recurrent malignancy, and does not want these interventions. Out of facility DNR given and they have paperwork to complete rest of Advance Directives if desired, however understand that I will  make note of DNR status. I mentioned Hospice if chemo DCd. 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 Progressed around completion of adjuvant carbo taxol, so is platinum resistant/ refractory. Salvage doxil begun 07-12-15. Ileus, partial SBO symptoms intermittently so not candidate for avastin. Hold doxil now.  Referral to genetics 2. C diff colitis: C diff antigen + with toxin negative, however with symptoms needs treatment. Flagyl 500 mg tid x 2 weeks begin today 3.SBO during recent hospitalization and self-limited symptoms before and since the hospitalization. Ileus. Should not use medications that slow bowel motility. 4.PAC in 5. Potassium ok to day, follow especially if diarrhea continues 6.long past  tobacco, COPD 7. Iron deficiency + chemo anemia: post IV feraheme. Improved on recent chemo break. 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 Post partial thyroidectomy for goiter ~ 1980, on medication until ~ 1 year ago. PCP managing. 10. DNR per patient's request 11.flu vaccine done 08-19-15    Time spent 40 min including >50% counseling and coordination of care. I will see her back next week with lab. All questions answered during visit and follow up by phone as documented.  Cc Dr Candace Gallus, MD   09/06/2015, 11:10 AM

## 2015-09-06 NOTE — Patient Instructions (Signed)

## 2015-09-07 LAB — CA 125: CA 125: 4834 U/mL — ABNORMAL HIGH (ref <35)

## 2015-09-08 ENCOUNTER — Telehealth: Payer: Self-pay

## 2015-09-08 ENCOUNTER — Telehealth: Payer: Self-pay | Admitting: Oncology

## 2015-09-08 ENCOUNTER — Other Ambulatory Visit: Payer: Self-pay

## 2015-09-08 ENCOUNTER — Other Ambulatory Visit: Payer: Self-pay | Admitting: Oncology

## 2015-09-08 DIAGNOSIS — C541 Malignant neoplasm of endometrium: Secondary | ICD-10-CM

## 2015-09-08 DIAGNOSIS — A0472 Enterocolitis due to Clostridium difficile, not specified as recurrent: Secondary | ICD-10-CM | POA: Insufficient documentation

## 2015-09-08 DIAGNOSIS — R634 Abnormal weight loss: Secondary | ICD-10-CM | POA: Insufficient documentation

## 2015-09-08 DIAGNOSIS — C482 Malignant neoplasm of peritoneum, unspecified: Secondary | ICD-10-CM

## 2015-09-08 DIAGNOSIS — R1084 Generalized abdominal pain: Secondary | ICD-10-CM | POA: Insufficient documentation

## 2015-09-08 MED ORDER — SODIUM CHLORIDE 0.9 % IV SOLN
INTRAVENOUS | Status: AC
  Filled 2015-09-08: qty 1000

## 2015-09-08 NOTE — Telephone Encounter (Signed)
-----   Message from Gordy Levan, MD sent at 09/08/2015  1:27 PM EDT ----- Needs IVF 10-20 if we can. She has C diff, so I am not sure what is possible.  If cannot do at [facility] or [facility], can we get HH to give at home?  Thanks [place]

## 2015-09-08 NOTE — Telephone Encounter (Signed)
Told Jessica Payne that she is set up for IVF tomorrow at 1300.  She needs to arrive at 1240 to register. If she needs to use the rest room prior to being in the infusion, requested that she go to the infusion room as she needs to use 1 bathroom and it needs to be isolated.  Pt. Verbalized understanding.

## 2015-09-08 NOTE — Telephone Encounter (Signed)
Medical Oncology  Reached patient by phone now. She is taking oral flagyl as directed, also using some antiemetics and some pain medication. Diarrhea has slowed tho still 3-4x thru night. She has been able to eat a little and is drinking some water and tea, fills up quickly and still abdominal pain. She felt better after IVF at office yesterday, feels "dry" again now. I have encouraged her to try sipping fluids every 15 min now. Will request IVF at office 10-20 if possible (will need C diff precautions if done there). Bad taste with Flagyl, may need to premed doses with antiemetics. Told patient that CA 125 higher, which may be progression of cancer, but hard to know if the C diff colitis is affecting that. Will try to resolve the colitis and reevaluate then. She knows to call if concerns over next few days.  Godfrey Pick, MD

## 2015-09-09 ENCOUNTER — Telehealth: Payer: Self-pay

## 2015-09-09 ENCOUNTER — Other Ambulatory Visit: Payer: Self-pay | Admitting: *Deleted

## 2015-09-09 ENCOUNTER — Ambulatory Visit (HOSPITAL_BASED_OUTPATIENT_CLINIC_OR_DEPARTMENT_OTHER): Payer: 59

## 2015-09-09 ENCOUNTER — Encounter: Payer: Self-pay | Admitting: General Practice

## 2015-09-09 ENCOUNTER — Other Ambulatory Visit: Payer: Self-pay | Admitting: Oncology

## 2015-09-09 VITALS — BP 131/87 | HR 136 | Temp 96.9°F

## 2015-09-09 DIAGNOSIS — R1111 Vomiting without nausea: Secondary | ICD-10-CM | POA: Diagnosis not present

## 2015-09-09 DIAGNOSIS — E876 Hypokalemia: Secondary | ICD-10-CM | POA: Diagnosis not present

## 2015-09-09 DIAGNOSIS — C541 Malignant neoplasm of endometrium: Secondary | ICD-10-CM

## 2015-09-09 DIAGNOSIS — A0472 Enterocolitis due to Clostridium difficile, not specified as recurrent: Secondary | ICD-10-CM

## 2015-09-09 DIAGNOSIS — C482 Malignant neoplasm of peritoneum, unspecified: Secondary | ICD-10-CM | POA: Diagnosis not present

## 2015-09-09 MED ORDER — SODIUM CHLORIDE 0.9 % IV SOLN
Freq: Once | INTRAVENOUS | Status: AC
  Administered 2015-09-09: 13:00:00 via INTRAVENOUS
  Filled 2015-09-09: qty 1000

## 2015-09-09 MED ORDER — SODIUM CHLORIDE 0.9 % IV SOLN
Freq: Once | INTRAVENOUS | Status: AC
  Administered 2015-09-09: 15:00:00 via INTRAVENOUS
  Filled 2015-09-09: qty 4

## 2015-09-09 NOTE — Progress Notes (Signed)
Spiritual Care Note  Acquainted with Pam's sister Ivin Booty from working together at [employer], I have an ongoing support relationship with both ladies.  Today Pam and I the unusual occasion to speak privately in infusion.  She shared her sense that her prognosis is poor, as cancer is spreading, and she is currently too weak for chemo.  She spoke of desire not to be resuscitated, but has not completed Advance Directives (AD) because she has not had frank conversations with her family about dying.  Per pt, dying does not trouble her as she suspects it bothers her family.  Before conversation was interrupted, I encouraged her sharing her feelings with family and offered support/assistance in preparing for such conversations and/or reviewing AD forms.  My sense is that she and her family are operating at two different levels:  Family, optimism and avoidance; pt, increasing realism and beginning inward preparation.  We plan to check in again Monday, but please also page as needs arise or circumstances change.  Thank you.  [place], [place], [facility] Pager [phone] Voicemail  [phone]

## 2015-09-09 NOTE — Telephone Encounter (Signed)
S/w pt that per Dr Marko Plume she really needs to come in for her fluids today. Do not take laxative today. The RN in infusion area can assess her abdomen and consult Dr Edwyna Shell at that time if needed. Pt stated "OK".

## 2015-09-09 NOTE — Patient Instructions (Signed)

## 2015-09-09 NOTE — Progress Notes (Signed)
Patient c/o feeling bloated. Abdomen is firm but + bowel sounds, patient states she is passing gas. Patient advised that she may take a Colace and glycerin suppository, advised to call in am to let us know how she is doing. She verbalized understanding.

## 2015-09-09 NOTE — Telephone Encounter (Signed)
Returning pt call. She is stating her stomach feels bloated and hard. She is scheduled for fluids today at 1 pm. She was throwing up last night and abd pain. She took a oxycodone and a lorazepam last night. She has not had BM since yesterday morning and that was "very little". She feels fine fluid wise right now and wants to take miralax today and hold off fluids until Monday. She is asking Dr Edwyna Shell opinion.

## 2015-09-10 ENCOUNTER — Telehealth: Payer: Self-pay

## 2015-09-10 NOTE — Telephone Encounter (Signed)
Jessica Payne stated that she took a colace tab last evening.  No glycerin suppository.  She had  Fair amount of soft stool this am.  She will continue with the colace tab and or glycerin suppository to keep bowels moving.  She is to continue to hold the Miralax per Dr. Mariana Kaufman instructions. She feels a little better today. Abdominal pain remains but not as intense right now.  She is using more of the OxyIR. [name] knows to go to the ED if she is not able keep any fluids down/vomiting  with trying to drink or severe abdominal pain as she could develop bowel obstruction. Reviewed above with Dr. Marko Plume.

## 2015-09-12 ENCOUNTER — Other Ambulatory Visit: Payer: Self-pay | Admitting: Oncology

## 2015-09-13 ENCOUNTER — Other Ambulatory Visit (HOSPITAL_BASED_OUTPATIENT_CLINIC_OR_DEPARTMENT_OTHER): Payer: 59

## 2015-09-13 ENCOUNTER — Other Ambulatory Visit: Payer: Self-pay | Admitting: Oncology

## 2015-09-13 ENCOUNTER — Encounter: Payer: Self-pay | Admitting: Oncology

## 2015-09-13 ENCOUNTER — Ambulatory Visit (HOSPITAL_BASED_OUTPATIENT_CLINIC_OR_DEPARTMENT_OTHER): Payer: 59 | Admitting: Oncology

## 2015-09-13 ENCOUNTER — Other Ambulatory Visit: Payer: Self-pay | Admitting: *Deleted

## 2015-09-13 VITALS — BP 129/83 | HR 109 | Temp 98.0°F | Resp 18 | Ht 67.0 in | Wt 148.9 lb

## 2015-09-13 DIAGNOSIS — A0472 Enterocolitis due to Clostridium difficile, not specified as recurrent: Secondary | ICD-10-CM

## 2015-09-13 DIAGNOSIS — R180 Malignant ascites: Secondary | ICD-10-CM

## 2015-09-13 DIAGNOSIS — N950 Postmenopausal bleeding: Secondary | ICD-10-CM

## 2015-09-13 DIAGNOSIS — D6481 Anemia due to antineoplastic chemotherapy: Secondary | ICD-10-CM | POA: Diagnosis not present

## 2015-09-13 DIAGNOSIS — C541 Malignant neoplasm of endometrium: Secondary | ICD-10-CM

## 2015-09-13 DIAGNOSIS — C539 Malignant neoplasm of cervix uteri, unspecified: Secondary | ICD-10-CM

## 2015-09-13 DIAGNOSIS — C482 Malignant neoplasm of peritoneum, unspecified: Secondary | ICD-10-CM

## 2015-09-13 DIAGNOSIS — D509 Iron deficiency anemia, unspecified: Secondary | ICD-10-CM

## 2015-09-13 DIAGNOSIS — A047 Enterocolitis due to Clostridium difficile: Secondary | ICD-10-CM | POA: Diagnosis not present

## 2015-09-13 DIAGNOSIS — C530 Malignant neoplasm of endocervix: Secondary | ICD-10-CM

## 2015-09-13 DIAGNOSIS — N390 Urinary tract infection, site not specified: Secondary | ICD-10-CM

## 2015-09-13 DIAGNOSIS — E860 Dehydration: Secondary | ICD-10-CM

## 2015-09-13 DIAGNOSIS — N882 Stricture and stenosis of cervix uteri: Secondary | ICD-10-CM

## 2015-09-13 DIAGNOSIS — G620 Drug-induced polyneuropathy: Secondary | ICD-10-CM

## 2015-09-13 DIAGNOSIS — E876 Hypokalemia: Secondary | ICD-10-CM

## 2015-09-13 LAB — CBC WITH DIFFERENTIAL/PLATELET
BASO%: 0.9 % (ref 0.0–2.0)
Basophils Absolute: 0.1 10*3/uL (ref 0.0–0.1)
EOS%: 1 % (ref 0.0–7.0)
Eosinophils Absolute: 0.1 10*3/uL (ref 0.0–0.5)
HEMATOCRIT: 32.2 % — AB (ref 34.8–46.6)
HEMOGLOBIN: 10.8 g/dL — AB (ref 11.6–15.9)
LYMPH#: 1.5 10*3/uL (ref 0.9–3.3)
LYMPH%: 19 % (ref 14.0–49.7)
MCH: 29 pg (ref 25.1–34.0)
MCHC: 33.5 g/dL (ref 31.5–36.0)
MCV: 86.7 fL (ref 79.5–101.0)
MONO#: 1.1 10*3/uL — ABNORMAL HIGH (ref 0.1–0.9)
MONO%: 14 % (ref 0.0–14.0)
NEUT%: 65.1 % (ref 38.4–76.8)
NEUTROS ABS: 5.2 10*3/uL (ref 1.5–6.5)
Platelets: 264 10*3/uL (ref 145–400)
RBC: 3.72 10*6/uL (ref 3.70–5.45)
RDW: 16.6 % — AB (ref 11.2–14.5)
WBC: 8.1 10*3/uL (ref 3.9–10.3)

## 2015-09-13 LAB — COMPREHENSIVE METABOLIC PANEL (CC13)
ALBUMIN: 2.4 g/dL — AB (ref 3.5–5.0)
ALK PHOS: 98 U/L (ref 40–150)
ALT: 14 U/L (ref 0–55)
AST: 24 U/L (ref 5–34)
Anion Gap: 11 mEq/L (ref 3–11)
BUN: 7.6 mg/dL (ref 7.0–26.0)
CALCIUM: 8 mg/dL — AB (ref 8.4–10.4)
CO2: 27 mEq/L (ref 22–29)
CREATININE: 0.8 mg/dL (ref 0.6–1.1)
Chloride: 99 mEq/L (ref 98–109)
EGFR: 79 mL/min/{1.73_m2} — ABNORMAL LOW (ref >90)
GLUCOSE: 124 mg/dL (ref 70–140)
Potassium: 3.4 mEq/L — ABNORMAL LOW (ref 3.5–5.1)
Sodium: 136 mEq/L (ref 136–145)
Total Bilirubin: <0.3 mg/dL (ref 0.20–1.20)
Total Protein: 5.9 g/dL — ABNORMAL LOW (ref 6.4–8.3)

## 2015-09-13 MED ORDER — OXYCODONE HCL 5 MG PO TABS: ORAL_TABLET | ORAL | Status: DC

## 2015-09-13 MED ORDER — LORAZEPAM 1 MG PO TABS: ORAL_TABLET | ORAL | Status: DC

## 2015-09-13 MED ORDER — ONDANSETRON HCL 8 MG PO TABS: 8.0000 mg | ORAL_TABLET | Freq: Three times a day (TID) | ORAL | Status: AC | PRN

## 2015-09-13 NOTE — Progress Notes (Signed)
OFFICE PROGRESS NOTE   September 13, 2015   Physicians: Everitt Amber, Leanna Battles  INTERVAL HISTORY:  Patient is seen, together with sister, in follow up of progressive, platinum resistant primary peritoneal carcinoma and recent complication of C difficile colitis. Last doxil was cycle 2 on 08-09-15; cycle 3 doxil was held 09-06-15 with C diff and other concerns.   Patient received IVF on 10-17 and 09-09-15. Patient has taken Flagyl 500 mg tid since 09-06-15, which she will need at least for 2 weeks. Last diarrhea was x4 on 09-11-15. She continues with diffuse mid abdominal pain within a short while after eating, tho this is not quite as severe. Pain is not epigastric and not quite as bad now with liquids. She has vomited only once in last few days. She is voiding, tho has to adjust position for to urinate. No dysuria, no bleediing. She has used 1/2 tab of oxyIR 5 mg today. Taste is bad with flagyl. She denies orthostatic symptoms now.   PAC by IR 07-08-15 Genetics testing 09-06-15 Pre-operative CA 125 1941 on 01-07-15 Flu vaccine 08-19-15   ONCOLOGIC HISTORY Patient had no regular medical care since she lost medical insurance. She had progressive abdominal swelling over ~ 6 months as well as some postmenopausal bleeding when she was admitted to [facility] in 11-2014 with abdominal pain and presumed peritonitis, initially thought to be either gall bladder related or cirrhosis. Initial paracentesis 12-11-2014 was for 3.5 liters, with malignant cytology consistent with gyn malignancy. Notes refer to CT and MRI "which demonstrated omental thickening, extensive ascites, slight enlargement of the right adnexa and a thickened endometrium (1.1 cm)." She was referred to Dr Josephina Shih, seen on 12-25-14 with endometrial biopsy not possible due to cervical stenosis. She had second paracentesis for 4.2 liters on 01-04-15. CA 125 on 01-07-15 was 1941 (pre op).She had TAH BSO, omentectomy and  radical debulking by Dr Denman George on 01-12-15, with 4 liters of ascites at time of surgery. Findings at surgery were of milial tumor over entire small bowel serosa, mesentery, diaphragm, plaque on bladder and involving cul de sac peritoneum. Surgery was optimal R1 cytoreduction. Pathology [phone]) demonstrated high grade serous carcinoma, IIIC primary peritoneal and at least IB endometrial.. Recommendation was for 6 cycles of carboplatin and taxol, then likely whole pelvic RT + vaginal brachytherapy if she has CR. US paracentesis 01-29-15 for 1.5 liters. She began dose dense carboplatin taxol on 02-04-15, given thru day 1 cycle 6 on 05-27-15 (taxol held cycle 6 due to peripheral neuropathy). She was found to have progressive disease within 4 weeks of completing carbo taxol, when admitted with SBO early 06-2015. She began salvage doxil on 07-12-15. Cycle 3 doxil held 10-11-26-14 with C diff colitis.    Review of systems as above, also: No fever. No increased SOB. No LE swelling. No other pain Remainder of 10 point Review of Systems negative.  Objective:  Vital signs in last 24 hours:  BP 129/83 mmHg  Pulse 109  Temp(Src) 98 F (36.7 C) (Oral)  Resp 18  Ht _0  (1.702 m)  Wt 148 lb 14.4 oz (67.541 kg)  BMI 23.32 kg/m2  SpO2 100% Weight up 4.5 lbs from 09-06-15 Alert, oriented and appropriate. Ambulatory carefully without assistance. Respirations not labored RA Still looks chronically ill and mildly uncomfortable, but overall some better than  09-06-15.  HEENT:PERRL, sclerae not icteric. Oral mucosa moist without lesions, posterior pharynx clear.  Neck supple. No JVD.  Lymphatics:no supraclavicular adenopathy Resp: somewhat  diminished BS thruout but clear to auscultation bilaterally and no dullness to percussion bilaterally Cardio: regular rate and rhythm. No gallop. GI: soft, nontender, not more distended, no mass or organomegaly. A few bowel sounds. Musculoskeletal/ Extremities: without pitting  edema, cords, tenderness Neuro: no change peripheral neuropathy. Otherwise nonfocal. Appropriate mood and affect Skin without rash, ecchymosis, petechiae Portacath-without erythema or tenderness  Lab Results:  Results for orders placed or performed in visit on 09/13/15  CBC with Differential  Result Value Ref Range   WBC 8.1 3.9 - 10.3 10e3/uL   NEUT# 5.2 1.5 - 6.5 10e3/uL   HGB 10.8 (L) 11.6 - 15.9 g/dL   HCT 32.2 (L) 34.8 - 46.6 %   Platelets 264 145 - 400 10e3/uL   MCV 86.7 79.5 - 101.0 fL   MCH 29.0 25.1 - 34.0 pg   MCHC 33.5 31.5 - 36.0 g/dL   RBC 3.72 3.70 - 5.45 10e6/uL   RDW 16.6 (H) 11.2 - 14.5 %   lymph# 1.5 0.9 - 3.3 10e3/uL   MONO# 1.1 (H) 0.1 - 0.9 10e3/uL   Eosinophils Absolute 0.1 0.0 - 0.5 10e3/uL   Basophils Absolute 0.1 0.0 - 0.1 10e3/uL   NEUT% 65.1 38.4 - 76.8 %   LYMPH% 19.0 14.0 - 49.7 %   MONO% 14.0 0.0 - 14.0 %   EOS% 1.0 0.0 - 7.0 %   BASO% 0.9 0.0 - 2.0 %  Comprehensive metabolic panel (Cmet) - [facility]  Result Value Ref Range   Sodium 136 136 - 145 mEq/L   Potassium 3.4 (L) 3.5 - 5.1 mEq/L   Chloride 99 98 - 109 mEq/L   CO2 27 22 - 29 mEq/L   Glucose 124 70 - 140 mg/dl   BUN 7.6 7.0 - 26.0 mg/dL   Creatinine 0.8 0.6 - 1.1 mg/dL   Total Bilirubin <0.30 0.20 - 1.20 mg/dL   Alkaline Phosphatase 98 40 - 150 U/L   AST 24 5 - 34 U/L   ALT 14 0 - 55 U/L   Total Protein 5.9 (L) 6.4 - 8.3 g/dL   Albumin 2.4 (L) 3.5 - 5.0 g/dL   Calcium 8.0 (L) 8.4 - 10.4 mg/dL   Anion Gap 11 3 - 11 mEq/L   EGFR 79 (L) >90 ml/min/1.73 m2     Studies/Results:  No results found.  Medications: I have reviewed the patient's current medications. Refill ativan, ,zofran, oxyIR  DISCUSSION C diff diagnosis and treatment discussed, patient very compliant with all treatment including oral flagyl. Suggested trying oxyIR AM, midday and evening, to see if helpful with abdominal pain. Still need to keep bowels moving daily if possible, as she is at risk for obstruction, and  expect constipation may become more of issue again after C diff hopefully resolves.   She will try to increase potassium po, with liquids or white potatoes.  Discussed difficulty sorting out what problems are related to C diff vs the primary peritoneal carcinoma/ endometrial carcinoma. She understands that sometimes chemo is not beneficial enough to be worth side effects of treatment, but for now we will see how she is with C diff treatment before deciding whether or not to try chemo further.  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 Progressed around completion of adjuvant carbo taxol, so is platinum resistant/ refractory. Salvage doxil begun 07-12-15. Ileus,  partial SBO symptoms intermittently so not candidate for avastin. Cycle 3 doxil held 09-06-15 with C diff. I will see again next week. Depending on status after C diff resolves, consider further treatment vs strict palliative care. Genetics testing sent 10-17 and pending. 2. C diff colitis: C diff antigen + with toxin negative, however with symptoms needs treatment. Flagyl 500 mg tid x 2 weeks begin today 3.SBO during recent hospitalization and self-limited symptoms before and since the hospitalization. Ileus. Should not use medications that slow bowel motility for this reason or with C diff 4.PAC in 5. Potassium 3.4 now, may need IV if unable to manage po 6.long past tobacco, COPD 7. Iron deficiency + chemo anemia: post IV feraheme. Improved on recent chemo break. 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 Post partial thyroidectomy for goiter ~ 1980, on medication until ~ 1 year ago. PCP managing. 10. DNR per patient's request 11.flu vaccine done 08-19-15   All questions answered. Patient and family  know to call if needed prior to next visit. Time spent 25 min including >50% counseling and coordination of care   Yula Crotwell P, MD   09/13/2015, 8:31 PM

## 2015-09-14 ENCOUNTER — Telehealth: Payer: Self-pay | Admitting: Oncology

## 2015-09-14 NOTE — Telephone Encounter (Signed)
lvm for pt regarding to NOV appt.. °

## 2015-09-15 DIAGNOSIS — E876 Hypokalemia: Secondary | ICD-10-CM | POA: Insufficient documentation

## 2015-09-15 DIAGNOSIS — E860 Dehydration: Secondary | ICD-10-CM | POA: Insufficient documentation

## 2015-09-19 ENCOUNTER — Other Ambulatory Visit: Payer: Self-pay | Admitting: Oncology

## 2015-09-19 DIAGNOSIS — A0472 Enterocolitis due to Clostridium difficile, not specified as recurrent: Secondary | ICD-10-CM

## 2015-09-19 DIAGNOSIS — C482 Malignant neoplasm of peritoneum, unspecified: Secondary | ICD-10-CM

## 2015-09-23 ENCOUNTER — Ambulatory Visit (HOSPITAL_BASED_OUTPATIENT_CLINIC_OR_DEPARTMENT_OTHER): Payer: 59

## 2015-09-23 ENCOUNTER — Telehealth: Payer: Self-pay | Admitting: Oncology

## 2015-09-23 ENCOUNTER — Ambulatory Visit (HOSPITAL_BASED_OUTPATIENT_CLINIC_OR_DEPARTMENT_OTHER): Payer: 59 | Admitting: Oncology

## 2015-09-23 ENCOUNTER — Other Ambulatory Visit (HOSPITAL_BASED_OUTPATIENT_CLINIC_OR_DEPARTMENT_OTHER): Payer: 59

## 2015-09-23 ENCOUNTER — Encounter: Payer: Self-pay | Admitting: Oncology

## 2015-09-23 VITALS — BP 128/87 | HR 115 | Temp 98.0°F | Resp 19 | Ht 67.0 in | Wt 148.8 lb

## 2015-09-23 DIAGNOSIS — C541 Malignant neoplasm of endometrium: Secondary | ICD-10-CM

## 2015-09-23 DIAGNOSIS — C482 Malignant neoplasm of peritoneum, unspecified: Secondary | ICD-10-CM | POA: Diagnosis not present

## 2015-09-23 DIAGNOSIS — D509 Iron deficiency anemia, unspecified: Secondary | ICD-10-CM

## 2015-09-23 DIAGNOSIS — D6481 Anemia due to antineoplastic chemotherapy: Secondary | ICD-10-CM

## 2015-09-23 DIAGNOSIS — A047 Enterocolitis due to Clostridium difficile: Secondary | ICD-10-CM | POA: Diagnosis not present

## 2015-09-23 DIAGNOSIS — A0472 Enterocolitis due to Clostridium difficile, not specified as recurrent: Secondary | ICD-10-CM

## 2015-09-23 DIAGNOSIS — Z95828 Presence of other vascular implants and grafts: Secondary | ICD-10-CM

## 2015-09-23 DIAGNOSIS — G620 Drug-induced polyneuropathy: Secondary | ICD-10-CM

## 2015-09-23 DIAGNOSIS — K5909 Other constipation: Secondary | ICD-10-CM

## 2015-09-23 DIAGNOSIS — K5900 Constipation, unspecified: Secondary | ICD-10-CM

## 2015-09-23 LAB — BASIC METABOLIC PANEL (CC13)
Anion Gap: 11 mEq/L (ref 3–11)
BUN: 10 mg/dL (ref 7.0–26.0)
CHLORIDE: 99 meq/L (ref 98–109)
CO2: 26 mEq/L (ref 22–29)
Calcium: 8.3 mg/dL — ABNORMAL LOW (ref 8.4–10.4)
Creatinine: 0.7 mg/dL (ref 0.6–1.1)
Glucose: 83 mg/dl (ref 70–140)
POTASSIUM: 4.6 meq/L (ref 3.5–5.1)
SODIUM: 136 meq/L (ref 136–145)

## 2015-09-23 LAB — MAGNESIUM (CC13): MAGNESIUM: 0.8 mg/dL — AB (ref 1.5–2.5)

## 2015-09-23 MED ORDER — SODIUM CHLORIDE 0.9 % IJ SOLN
10.0000 mL | INTRAMUSCULAR | Status: DC | PRN
  Administered 2015-09-23: 10 mL via INTRAVENOUS
  Filled 2015-09-23: qty 10

## 2015-09-23 MED ORDER — SODIUM CHLORIDE 0.9 % IV SOLN
2.0000 g | Freq: Once | INTRAVENOUS | Status: AC
  Administered 2015-09-23: 2 g via INTRAVENOUS
  Filled 2015-09-23: qty 4

## 2015-09-23 MED ORDER — HEPARIN SOD (PORK) LOCK FLUSH 100 UNIT/ML IV SOLN
500.0000 [IU] | Freq: Once | INTRAVENOUS | Status: AC
  Administered 2015-09-23: 500 [IU] via INTRAVENOUS
  Filled 2015-09-23: qty 5

## 2015-09-23 NOTE — Progress Notes (Signed)
OFFICE PROGRESS NOTE   September 23, 2015   [place]  INTERVAL HISTORY:   Patient is seen, together with sister, in continuing attention to primary peritoneal carcinoma and endometrial carcinoma, platinum resistant and recently progressive. Last chemo was doxil 08-09-15, held since then due to C difficile diarrhea, 14 day course of flagyl nearly completed.  GI symptoms have partially improved with treatment of C diff, with patient actually feeling much better for 2 days, then "ate too much" including hamburger and ice cream yesterday and has had abdominal pain and bloating with no BM since last PM. She had vomited only once in past week until vomited x1 again last PM. She has not taken laxatives or suppository, noting per rectum by time of this appointment today. Taste is bad with flagyl. No fever. No increased SOB. No bladder symptoms. No LE swelling. No bleeding. Slept very little last night with the abdominal discomfort. Review of systems as above, remainder of 10 point Review of Systems negative.  PAC by IR 07-08-15 Genetics testing  Sent 09-06-15 and pending Pre-operative CA 125 1941 on 01-07-15 Flu vaccine 08-19-15  ONCOLOGIC HISTORY Patient had no regular medical care since she lost medical insurance. She had progressive abdominal swelling over ~ 6 months as well as some postmenopausal bleeding when she was admitted to [facility] in 11-2014 with abdominal pain and presumed peritonitis, initially thought to be either gall bladder related or cirrhosis. Initial paracentesis 12-11-2014 was for 3.5 liters, with malignant cytology consistent with gyn malignancy. Notes refer to CT and MRI "which demonstrated omental thickening, extensive ascites, slight enlargement of the right adnexa and a thickened endometrium (1.1 cm)." She was referred to Dr Josephina Shih, seen on 12-25-14 with endometrial biopsy not possible due to cervical stenosis. She had second  paracentesis for 4.2 liters on 01-04-15. CA 125 on 01-07-15 was 1941 (pre op).She had TAH BSO, omentectomy and radical debulking by Dr Denman George on 01-12-15, with 4 liters of ascites at time of surgery. Findings at surgery were of milial tumor over entire small bowel serosa, mesentery, diaphragm, plaque on bladder and involving cul de sac peritoneum. Surgery was optimal R1 cytoreduction. Pathology [phone]) demonstrated high grade serous carcinoma, IIIC primary peritoneal and at least IB endometrial.. Recommendation was for 6 cycles of carboplatin and taxol, then likely whole pelvic RT + vaginal brachytherapy if she has CR. US paracentesis 01-29-15 for 1.5 liters. She began dose dense carboplatin taxol on 02-04-15, given thru day 1 cycle 6 on 05-27-15 (taxol held cycle 6 due to peripheral neuropathy). She was found to have progressive disease within 4 weeks of completing carbo taxol, when admitted with SBO early 06-2015. She began salvage doxil on 07-12-15. Cycle 3 doxil held 10-11-26-14 with C diff colitis.      Objective:  Vital signs in last 24 hours:  BP 128/87 mmHg  Pulse 115  Temp(Src) 98 F (36.7 C) (Oral)  Resp 19  Ht 5' 7"  (1.702 m)  Wt 148 lb 12.8 oz (67.495 kg)  BMI 23.30 kg/m2  SpO2 98% Weight stable in past week. Looks mildly uncomfortable from abdomen, but overall still better than at last few visits. Alert, oriented and appropriate. Ambulatory without assistance.  Alopecia  HEENT:PERRL, sclerae not icteric. Oral mucosa moist without lesions, posterior pharynx clear.  Neck supple. No JVD.  Lymphatics:no supraclavicular adenopathy Resp: clear to auscultation bilaterally and normal percussion bilaterally Cardio: regular rate and rhythm. No gallop. GI: abdomen moderately distended but not tight and no  marked tenderness to palpation. Only occasional bowel sounds. Surgical incision not remarkable. Musculoskeletal/ Extremities: without pitting edema, cords, tenderness Neuro: no change  peripheral neuropathy. Otherwise nonfocal. PSYCH appropriate mood and affect Skin without rash, ecchymosis, petechiae Portacath-without erythema or tenderness  Lab Results:  Results for orders placed or performed in visit on 23/30/07  Basic metabolic panel (Bmet) - [facility]  Result Value Ref Range   Sodium 136 136 - 145 mEq/L   Potassium 4.6 3.5 - 5.1 mEq/L   Chloride 99 98 - 109 mEq/L   CO2 26 22 - 29 mEq/L   Glucose 83 70 - 140 mg/dl   BUN 10.0 7.0 - 26.0 mg/dL   Creatinine 0.7 0.6 - 1.1 mg/dL   Calcium 8.3 (L) 8.4 - 10.4 mg/dL   Anion Gap 11 3 - 11 mEq/L   EGFR >90 >90 ml/min/1.73 m2  Magnesium  Result Value Ref Range   Magnesium 0.8 (LL) 1.5 - 2.5 mg/dl     Studies/Results:  No results found.  Medications: I have reviewed the patient's current medications. Will give 2 grams magnesium IV today for Mg 0.8. She will start magnesium oxide 400 mg daily. She will try glycerin suppository today, can add miralax to oral magnesium if needed.  She has not been taking flagyl quite tid, discussed.  DISCUSSION: interval history reviewed as above. Interventions for hypomagnesemia as noted. Recommended clear liquids until bowels move, and have reminded her to continue low residue diet and back down to liquids if bowels are not moving adequately. Complete flagyl with q 8 hr dosing.  Not clear if she will be able to continue doxil, which she seems reluctant to consider now. GI symptoms have been some better related to treatment of C diff, so will reevaluate with MD visit next week at request of patient and sister. She knows to call prior to scheduled visit if more concerns, including abdominal pain, persistent lact of bowel movements, continuous vomiting.  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 Progressed around completion of adjuvant carbo taxol. Salvage doxil begun 07-12-15. Ileus, partial SBO symptoms intermittently so not candidate for avastin. Cycle 3 doxil held 09-06-15 with C diff. I will see again next week. Depending on status after C diff resolves, consider further treatment vs strict palliative care. Genetics testing sent 10-17 and pending. 2. C diff colitis: C diff antigen + with toxin negative, however with symptoms needs treatment. Flagyl 500 mg tid x 2 weeks nearly completed (has not been completely compliant). Will need to retest C diff if diarrhea reoccurs after flagly completes 3.SBO during recent hospitalization and self-limited symptoms before and since the hospitalization. Ileus. Should not use medications that slow bowel motility for this reason or with C diff 4.PAC in 5. Hypomagnesemia, tho K fine: supplement Mg IV now, then po mag oxide, which may also help with constipation.  6.long past tobacco, COPD 7. Iron deficiency + chemo anemia: post IV feraheme. Improved on recent chemo break. 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 Post partial thyroidectomy for goiter ~ 1980, on medication until ~ 1 year ago. PCP managing. 10. DNR per patient's request 11.flu vaccine done 08-19-15   All questions answered. Orders for magnesium IV discussed with pharmacy and entered for today. Time spent 25 min including >50% counseling and coordination of care.  LIVESAY,LENNIS P, MD   09/23/2015, 1:30 PM

## 2015-09-23 NOTE — Patient Instructions (Signed)
Hypomagnesemia °Hypomagnesemia is a condition in which the level of magnesium in the blood is low. Magnesium is a mineral that is found in many foods. It is used in many different processes in the body. Hypomagnesemia can affect every organ in the body. It can cause life-threatening problems. °CAUSES °Causes of hypomagnesemia include: °· Not getting enough magnesium in your diet. °· Malnutrition. °· Problems with absorbing magnesium from the intestines. °· Dehydration. °· Alcohol abuse. °· Vomiting. °· Severe diarrhea. °· Some medicines, including medicines that make you urinate more. °· Certain diseases, such as kidney disease, diabetes, and overactive thyroid. °SIGNS AND SYMPTOMS °· Involuntary shaking or trembling of a body part (tremor). °· Confusion. °· Muscle weakness. °· Sensitivity to light, sound, and touch. °· Psychiatric issues, such as depression, irritability, or psychosis. °· Sudden tightening of muscles (muscle spasms). °· Tingling in the arms and legs. °· A feeling of fluttering of the heart. °These symptoms are more severe if magnesium levels drop suddenly. °DIAGNOSIS °To make a diagnosis, your health care provider will do a physical exam and order blood and urine tests. °TREATMENT °Treatment will depend on the cause and the severity of your condition. It may involve: °· A magnesium supplement. This can be taken in pill form. It can also be given through an IV tube. This is usually done if the condition is severe. °· Changes to your diet. You may be directed to eat foods that have a lot of magnesium, such as green leafy vegetables, peas, beans, and nuts. °· Eliminating alcohol from your diet. °HOME CARE INSTRUCTIONS °· Include foods with magnesium in your diet. Foods that are rich in magnesium include green vegetables, beans, nuts and seeds, and whole grains. °· Take medicines only as directed by your health care provider. °· Take magnesium supplements if your health care provider instructs you to  do that. Take them as directed. °· Have your magnesium levels monitored as directed by your health care provider. °· When you are active, drink fluids that contain electrolytes. °· Keep all follow-up visits as directed by your health care provider. This is important. °SEEK MEDICAL CARE IF: °· You get worse instead of better. °· Your symptoms return. °SEEK IMMEDIATE MEDICAL CARE IF: °· Your symptoms are severe. °  °This information is not intended to replace advice given to you by your health care provider. Make sure you discuss any questions you have with your health care provider. °  °Document Released: 08/02/2005 Document Revised: 11/27/2014 Document Reviewed: 06/22/2014 °Elsevier Interactive Patient Education ©2016 [employer] Inc. ° °

## 2015-09-23 NOTE — Telephone Encounter (Signed)
Appointments made and avs will be printed in chemo  °

## 2015-09-24 ENCOUNTER — Telehealth: Payer: Self-pay | Admitting: Genetic Counselor

## 2015-09-24 ENCOUNTER — Encounter: Payer: Self-pay | Admitting: Genetic Counselor

## 2015-09-24 DIAGNOSIS — Z1379 Encounter for other screening for genetic and chromosomal anomalies: Secondary | ICD-10-CM | POA: Insufficient documentation

## 2015-09-24 NOTE — Telephone Encounter (Signed)
Revealed negative genetic testing on the custom panel that looked at genes associated with breast, ovarian and uterine cancer.

## 2015-09-28 ENCOUNTER — Ambulatory Visit: Payer: Self-pay | Admitting: Genetic Counselor

## 2015-09-28 DIAGNOSIS — Z1379 Encounter for other screening for genetic and chromosomal anomalies: Secondary | ICD-10-CM

## 2015-09-28 DIAGNOSIS — C482 Malignant neoplasm of peritoneum, unspecified: Secondary | ICD-10-CM

## 2015-09-28 NOTE — Progress Notes (Signed)
HPI: Ms. Thau was previously seen in the [place] clinic due to a personal history of primary peritoneal and uterine cancer and a family history of colon cancer and concerns regarding a hereditary predisposition to cancer. Please refer to our prior cancer genetics clinic note for more information regarding Ms. Coviello's medical, social and family histories, and our assessment and recommendations, at the time. Ms. Toves recent genetic test results were disclosed to her, as were recommendations warranted by these results. These results and recommendations are discussed in more detail below.  FAMILY HISTORY:  We obtained a detailed, 4-generation family history.  Significant diagnoses are listed below: Family History  Problem Relation Age of Onset  . Colon cancer Maternal Uncle     dx in his 44s-60s  . Lung cancer Paternal Uncle     smoker  . Brain cancer Cousin     dx in her 33s-30s    Ms. Iglesia has one child and one sister, both are cancer free. Her parents are alive at 77 and 27. Her mother had four brothers and two sisters. One brother had colon cancer in his late 79s-early 60s. Her grandparents did not have cancer. Her dad had two sisters and two brothers. One brother was a smoker and died of lung cancer. She has a paternal cousin who had a brain tumor in her 53s-30s, There is no other reported family history of cancer. Patient's maternal ancestors are of [place] descent, and paternal ancestors are of [place] descent. There is no reported Ashkenazi Jewish ancestry. There is no known consanguinity.  GENETIC TEST RESULTS: At the time of Ms. Miera's visit, we recommended she pursue genetic testing of the custom gene panel that looks at both breast, ovarian and uterine cancer risks. The Custom gene panel offered by [facility] includes sequencing and rearrangement analysis for the following 22 genes:  ATM, BARD1, BRCA1, BRCA2, BRIP1, CDH1, CHEK2, EPCAM, FANCC,  MLH1, MSH2, MSH6, MUTYH, NBN, PALB2, PMS2, POLD1, PTEN, RAD51C, RAD51D, TP53, and XRCC2.   The report date is September 23, 2015.  Genetic testing was normal, and did not reveal a deleterious mutation in these genes. The test report has been scanned into EPIC and is located under the Molecular Pathology section of the Results Review tab.   We discussed with Ms. Cardamone that since the current genetic testing is not perfect, it is possible there may be a gene mutation in one of these genes that current testing cannot detect, but that chance is small. We also discussed, that it is possible that another gene that has not yet been discovered, or that we have not yet tested, is responsible for the cancer diagnoses in the family, and it is, therefore, important to remain in touch with cancer genetics in the future so that we can continue to offer Ms. Albertsen the most up to date genetic testing.   CANCER SCREENING RECOMMENDATIONS: This result is reassuring and indicates that Ms. Shingler likely does not have an increased risk for a future cancer due to a mutation in one of these genes. This normal test also suggests that Ms. Repsher's cancer was most likely not due to an inherited predisposition associated with one of these genes.  Most cancers happen by chance and this negative test suggests that her cancer falls into this category.  We, therefore, recommended she continue to follow the cancer management and screening guidelines provided by her oncology and primary healthcare provider.   RECOMMENDATIONS FOR FAMILY MEMBERS: Women in this family  might be at some increased risk of developing cancer, over the general population risk, simply due to the family history of cancer. We recommended women in this family have a yearly mammogram beginning at age 52, or 10 years younger than the earliest onset of cancer, an an annual clinical breast exam, and perform monthly breast self-exams. Women in this family should also have a  gynecological exam as recommended by their primary provider. All family members should have a colonoscopy by age 34.  FOLLOW-UP: Lastly, we discussed with Ms. Lyne that cancer genetics is a rapidly advancing field and it is possible that new genetic tests will be appropriate for her and/or her family members in the future. We encouraged her to remain in contact with cancer genetics on an annual basis so we can update her personal and family histories and let her know of advances in cancer genetics that may benefit this family.   Our contact number was provided. Ms. Yousuf questions were answered to her satisfaction, and she knows she is welcome to call us at anytime with additional questions or concerns.   Roma Kayser, MS, [facility] Certified Genetic Counselor [email or web address]

## 2015-09-29 ENCOUNTER — Other Ambulatory Visit: Payer: Self-pay | Admitting: Oncology

## 2015-09-29 ENCOUNTER — Other Ambulatory Visit: Payer: Self-pay

## 2015-09-29 DIAGNOSIS — C482 Malignant neoplasm of peritoneum, unspecified: Secondary | ICD-10-CM

## 2015-09-30 ENCOUNTER — Other Ambulatory Visit (HOSPITAL_BASED_OUTPATIENT_CLINIC_OR_DEPARTMENT_OTHER): Payer: 59

## 2015-09-30 ENCOUNTER — Ambulatory Visit (HOSPITAL_COMMUNITY)
Admission: RE | Admit: 2015-09-30 | Discharge: 2015-09-30 | Disposition: A | Discharge status: Home / Self Care | Payer: 59 | Source: Ambulatory Visit | Attending: Oncology | Admitting: Oncology

## 2015-09-30 ENCOUNTER — Telehealth: Payer: Self-pay | Admitting: Oncology

## 2015-09-30 ENCOUNTER — Encounter: Payer: Self-pay | Admitting: Oncology

## 2015-09-30 ENCOUNTER — Ambulatory Visit (HOSPITAL_BASED_OUTPATIENT_CLINIC_OR_DEPARTMENT_OTHER): Payer: 59 | Admitting: Oncology

## 2015-09-30 VITALS — BP 139/92 | HR 116 | Temp 97.5°F | Resp 18 | Ht 67.0 in | Wt 149.5 lb

## 2015-09-30 DIAGNOSIS — R180 Malignant ascites: Secondary | ICD-10-CM

## 2015-09-30 DIAGNOSIS — Z66 Do not resuscitate: Secondary | ICD-10-CM

## 2015-09-30 DIAGNOSIS — A047 Enterocolitis due to Clostridium difficile: Secondary | ICD-10-CM

## 2015-09-30 DIAGNOSIS — R1030 Lower abdominal pain, unspecified: Secondary | ICD-10-CM | POA: Diagnosis not present

## 2015-09-30 DIAGNOSIS — N950 Postmenopausal bleeding: Secondary | ICD-10-CM

## 2015-09-30 DIAGNOSIS — C541 Malignant neoplasm of endometrium: Secondary | ICD-10-CM

## 2015-09-30 DIAGNOSIS — C539 Malignant neoplasm of cervix uteri, unspecified: Secondary | ICD-10-CM

## 2015-09-30 DIAGNOSIS — Z95828 Presence of other vascular implants and grafts: Secondary | ICD-10-CM

## 2015-09-30 DIAGNOSIS — R918 Other nonspecific abnormal finding of lung field: Secondary | ICD-10-CM | POA: Diagnosis not present

## 2015-09-30 DIAGNOSIS — N882 Stricture and stenosis of cervix uteri: Secondary | ICD-10-CM

## 2015-09-30 DIAGNOSIS — C786 Secondary malignant neoplasm of retroperitoneum and peritoneum: Secondary | ICD-10-CM | POA: Insufficient documentation

## 2015-09-30 DIAGNOSIS — D509 Iron deficiency anemia, unspecified: Secondary | ICD-10-CM

## 2015-09-30 DIAGNOSIS — R933 Abnormal findings on diagnostic imaging of other parts of digestive tract: Secondary | ICD-10-CM | POA: Insufficient documentation

## 2015-09-30 DIAGNOSIS — G620 Drug-induced polyneuropathy: Secondary | ICD-10-CM

## 2015-09-30 DIAGNOSIS — C482 Malignant neoplasm of peritoneum, unspecified: Secondary | ICD-10-CM | POA: Diagnosis not present

## 2015-09-30 DIAGNOSIS — D6481 Anemia due to antineoplastic chemotherapy: Secondary | ICD-10-CM

## 2015-09-30 DIAGNOSIS — N390 Urinary tract infection, site not specified: Secondary | ICD-10-CM

## 2015-09-30 LAB — CBC WITH DIFFERENTIAL/PLATELET
BASO%: 0.5 % (ref 0.0–2.0)
BASOS ABS: 0.1 10*3/uL (ref 0.0–0.1)
EOS%: 1.1 % (ref 0.0–7.0)
Eosinophils Absolute: 0.1 10*3/uL (ref 0.0–0.5)
HCT: 36.1 % (ref 34.8–46.6)
HEMOGLOBIN: 11.8 g/dL (ref 11.6–15.9)
LYMPH#: 2.2 10*3/uL (ref 0.9–3.3)
LYMPH%: 21.3 % (ref 14.0–49.7)
MCH: 29.1 pg (ref 25.1–34.0)
MCHC: 32.7 g/dL (ref 31.5–36.0)
MCV: 88.9 fL (ref 79.5–101.0)
MONO#: 1 10*3/uL — ABNORMAL HIGH (ref 0.1–0.9)
MONO%: 10.1 % (ref 0.0–14.0)
NEUT#: 6.8 10*3/uL — ABNORMAL HIGH (ref 1.5–6.5)
NEUT%: 67 % (ref 38.4–76.8)
Platelets: 195 10*3/uL (ref 145–400)
RBC: 4.06 10*6/uL (ref 3.70–5.45)
RDW: 16.8 % — AB (ref 11.2–14.5)
WBC: 10.1 10*3/uL (ref 3.9–10.3)
nRBC: 0 % (ref 0–0)

## 2015-09-30 LAB — COMPREHENSIVE METABOLIC PANEL (CC13)
ALK PHOS: 139 U/L (ref 40–150)
ALT: 18 U/L (ref 0–55)
AST: 27 U/L (ref 5–34)
Albumin: 2.4 g/dL — ABNORMAL LOW (ref 3.5–5.0)
Anion Gap: 13 mEq/L — ABNORMAL HIGH (ref 3–11)
BILIRUBIN TOTAL: 0.51 mg/dL (ref 0.20–1.20)
BUN: 13 mg/dL (ref 7.0–26.0)
CO2: 26 meq/L (ref 22–29)
CREATININE: 0.8 mg/dL (ref 0.6–1.1)
Calcium: 8.1 mg/dL — ABNORMAL LOW (ref 8.4–10.4)
Chloride: 96 mEq/L — ABNORMAL LOW (ref 98–109)
EGFR: 83 mL/min/{1.73_m2} — AB (ref >90)
GLUCOSE: 74 mg/dL (ref 70–140)
POTASSIUM: 3.7 meq/L (ref 3.5–5.1)
SODIUM: 134 meq/L — AB (ref 136–145)
TOTAL PROTEIN: 6.1 g/dL — AB (ref 6.4–8.3)

## 2015-09-30 LAB — MAGNESIUM (CC13): MAGNESIUM: 0.8 mg/dL — AB (ref 1.5–2.5)

## 2015-09-30 MED ORDER — LORAZEPAM 1 MG PO TABS
ORAL_TABLET | ORAL | Status: DC
Start: 2015-09-30 — End: 2015-12-14

## 2015-09-30 MED ORDER — OXYCODONE HCL 5 MG PO TABS: ORAL_TABLET | ORAL | Status: AC

## 2015-09-30 NOTE — Procedures (Signed)
Successful US guided paracentesis from RLQ.  Yielded 2.6L of clear yellow fluid.  No immediate complications.  Pt tolerated well.   Specimen was not sent for labs.  Ascencion Dike PA-C 09/30/2015 4:20 PM

## 2015-09-30 NOTE — Progress Notes (Signed)
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 Last chemotherapy was 2 cycles of doxil 8-22 and 08-09-15, course since then complicated by intermittent bowel obstruction and C.diff colitis. Despite improvement in C.diff since completing oral flagyl in past week, PS remains very poor and she appears very symptomatic from the gyn cancer.  She received 2 gm IV magnesium at visit last week after lab resulted low.  Since she was here last week, patient has not had improvement in tolerance of po's, with occasional vomiting including x2 last pm. Abdomen is more distended and uncomfortable, last paracentesis 06-2015 for 1.5 liters, and will get US paracentesis by IR later today. She has abdominal pain and bowels are moving poorly, tho no diarrhea. Ativan is helpful and I have told her that it is fine to use for sleep at hs also. She is weak, denies fever or SOB, no LE swelling, no bleeding.  Remainder of 10 point Review of Systems negative/ unchanged  PAC, flushed 09-23-15 Genetics testing Sent 09-06-15 normal (Custom Gene Panel by [facility]) Pre-operative CA 125 1941 on 01-07-15 Flu vaccine 08-19-15  ONCOLOGIC HISTORY Patient had no regular medical care since she lost medical insurance. She had progressive abdominal swelling over ~ 6 months as well as some postmenopausal bleeding when she was admitted to [facility] in 11-2014 with abdominal pain and presumed peritonitis, initially thought to be either gall bladder related or cirrhosis. Initial paracentesis 12-11-2014 was for 3.5 liters, with malignant cytology consistent with gyn malignancy. Notes refer to CT and MRI "which demonstrated omental thickening, extensive ascites, slight enlargement of the right adnexa and a thickened  endometrium (1.1 cm)." She was referred to Dr Josephina Shih, seen on 12-25-14 with endometrial biopsy not possible due to cervical stenosis. She had second paracentesis for 4.2 liters on 01-04-15. CA 125 on 01-07-15 was 1941 (pre op).She had TAH BSO, omentectomy and radical debulking by Dr Denman George on 01-12-15, with 4 liters of ascites at time of surgery. Findings at surgery were of milial tumor over entire small bowel serosa, mesentery, diaphragm, plaque on bladder and involving cul de sac peritoneum. Surgery was optimal R1 cytoreduction. Pathology [phone]) demonstrated high grade serous carcinoma, IIIC primary peritoneal and at least IB endometrial.. Recommendation was for 6 cycles of carboplatin and taxol, then likely whole pelvic RT + vaginal brachytherapy if she has CR. US paracentesis 01-29-15 for 1.5 liters. She began dose dense carboplatin taxol on 02-04-15, given thru day 1 cycle 6 on 05-27-15 (taxol held cycle 6 due to peripheral neuropathy). She was found to have progressive disease within 4 weeks of completing carbo taxol, when admitted with SBO early 06-2015. She began salvage doxil on 07-12-15. Cycle 3 doxil held 10-11-26-14 with C diff colitis.     Objective:  Vital signs in last 24 hours:  BP 139/92 mmHg  Pulse 116  Temp(Src) 97.5 F (36.4 C) (Oral)  Resp 18  Ht 5' 7"  (1.702 m)  Wt 149 lb 8 oz (67.813 kg)  BMI 23.41 kg/m2  SpO2 97% Weight stable Alert, oriented and appropriate. Looks chronically ill and weak. Color poor, not cyanotic or icteric. Ambulatory slowly without assistance but agrees to [facility] leaving office  HEENT:PERRL, sclerae not icteric. Oral mucosa somewhat day without lesions, posterior pharynx clear.  Neck supple. No JVD.  Lymphatics:no cervical,supraclavicular adenopathy Resp: diminished BS  thruout otherwise clear to auscultation bilaterally and no dullness to percussion bilaterally Cardio: regular rate and rhythm. No gallop. GI: soft, nontender, more distended nearly  tight. Quiet. Surgical incision not remarkable. Musculoskeletal/ Extremities: without pitting edema, cords, tenderness Neuro: no peripheral neuropathy. Otherwise nonfocal. PSYCH appropriate mood and affect Skin without rash, ecchymosis, petechiae Portacath-without erythema or tenderness  Lab Results:  Results for orders placed or performed in visit on 09/30/15  CBC with Differential  Result Value Ref Range   WBC 10.1 3.9 - 10.3 10e3/uL   NEUT# 6.8 (H) 1.5 - 6.5 10e3/uL   HGB 11.8 11.6 - 15.9 g/dL   HCT 36.1 34.8 - 46.6 %   Platelets 195 145 - 400 10e3/uL   MCV 88.9 79.5 - 101.0 fL   MCH 29.1 25.1 - 34.0 pg   MCHC 32.7 31.5 - 36.0 g/dL   RBC 4.06 3.70 - 5.45 10e6/uL   RDW 16.8 (H) 11.2 - 14.5 %   lymph# 2.2 0.9 - 3.3 10e3/uL   MONO# 1.0 (H) 0.1 - 0.9 10e3/uL   Eosinophils Absolute 0.1 0.0 - 0.5 10e3/uL   Basophils Absolute 0.1 0.0 - 0.1 10e3/uL   NEUT% 67.0 38.4 - 76.8 %   LYMPH% 21.3 14.0 - 49.7 %   MONO% 10.1 0.0 - 14.0 %   EOS% 1.1 0.0 - 7.0 %   BASO% 0.5 0.0 - 2.0 %   nRBC 0 0 - 0 %  Comprehensive metabolic panel (Cmet) - [facility]  Result Value Ref Range   Sodium 134 (L) 136 - 145 mEq/L   Potassium 3.7 3.5 - 5.1 mEq/L   Chloride 96 (L) 98 - 109 mEq/L   CO2 26 22 - 29 mEq/L   Glucose 74 70 - 140 mg/dl   BUN 13.0 7.0 - 26.0 mg/dL   Creatinine 0.8 0.6 - 1.1 mg/dL   Total Bilirubin 0.51 0.20 - 1.20 mg/dL   Alkaline Phosphatase 139 40 - 150 U/L   AST 27 5 - 34 U/L   ALT 18 0 - 55 U/L   Total Protein 6.1 (L) 6.4 - 8.3 g/dL   Albumin 2.4 (L) 3.5 - 5.0 g/dL   Calcium 8.1 (L) 8.4 - 10.4 mg/dL   Anion Gap 13 (H) 3 - 11 mEq/L   EGFR 83 (L) >90 ml/min/1.73 m2  Magnesium  Result Value Ref Range   Magnesium 0.8 (LL) 1.5 - 2.5 mg/dl     Studies/Results:  Dg Abd 2 Views  09/30/2015  CLINICAL DATA:  Recent diagnosis of peritoneal carcinomatosis, now with lower abdominal pain and discomfort. EXAM: ABDOMEN - 2 VIEW COMPARISON:  08/19/2015; CT abdomen pelvis - 06/26/2015  FINDINGS: There is mild gaseous distention of a solitary loop of small bowel within left mid hemi abdomen measuring approximately 5.3 cm in diameter. Additionally, several air-fluid levels are seen on the provided upright radiograph. These findings are associated with a paucity of distal colonic gas. No pneumoperitoneum, pneumatosis or portal venous gas. Limited visualization of lower thorax demonstrates interval development of a small left-sided with associated left basilar atelectasis. Port a catheter tip overlies the superior cavoatrial junction. Mild scoliotic curvature of the lumbar spine with associated moderate multilevel lumbar spine DDD. Surgical clips overlie the midline of the lower pelvis. IMPRESSION: 1. Findings worrisome for developing small bowel obstruction. Continued attention on follow-up is recommended. 2. Interval development of a small left-sided effusion with associated left basilar opacities, atelectasis versus infiltrate. Further evaluation dedicated chest radiograph could be performed as clinically indicated. Electronically  Signed   By: Sandi Mariscal M.D.   On: 09/30/2015 17:08    Medications: I have reviewed the patient's current medications. Resume magnesium oxide tablets 1-2x daily. With findings on abd xray after visit, will let patient know not to begin reglan.  DISCUSSION To address symptoms today, I have spoken directly with central radiology scheduling to set up US paracentesis this afternoon. Will get plain abd xray also today to help direct medication use.  We have talked about fact that she is not well enough to pursue chemotherapy, which would not be curative and has very little chance of improving the underlying cancer, but certainly could worsen quality of life even further. She and sister understand that she is at point that she has had maximum benefit from chemotherapy and would be better served now by fully palliative approach. Sister is crying again today, tho they  both expected this and are in agreement with this direction of care. Patient agrees with recommendation for referral to [facility], which I have done now. They understand that Hospice will stay in close communication with this office and that I am glad to see her for scheduled visits or prn. Again they express appreciation for care.  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 Progressed around completion of adjuvant carbo taxol. Salvage doxil begun 07-12-15. Ileus, partial SBO symptoms intermittently so not candidate for avastin. Cycle 3 doxil held 09-06-15 with C diff. Unfortunately status not better overall since treatment of C diff, with PS poor and significant cancer related symptoms. Will not pursue further chemotherapy, appreciate assistance now from [facility]. US paracentesis today if sufficient ascites, then prn.  Genetics testing negative 2. C diff colitis: C diff antigen + with toxin negative, however with symptoms was treated with  Flagyl 500 mg tid x 2 weeks, finished ~ 09-30-15. Will need to retest C diff if diarrhea reoccurs after flagly completes 3.SBO during recent hospitalization and self-limited symptoms before and since the hospitalization. Ileus. Should not use medications that slow bowel motility for this reason or with C diff 4.PAC in 5. Hypomagnesemia, tho K fine: no improvement after IV magnesium last week. She will resume po magnesium as tolerated. Does not need scheduled recheck of lab given rest of situation.  6.long past tobacco, COPD 7. Iron deficiency + chemo anemia: post IV feraheme. Improved on recent chemo break. 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 Post partial thyroidectomy for goiter ~ 1980, on medication until  ~ 1 year ago. PCP managing. 10. DNR per patient's request 11.flu vaccine done 08-19-15   All questions answered. Time spent 25 min including >50% counseling and coordination of care. Cc Dr Philip Aspen and Dr Bartholomew Boards, MD   09/30/2015, 5:15 PM

## 2015-09-30 NOTE — Telephone Encounter (Signed)
Appointments made and avs printted for patient

## 2015-10-01 ENCOUNTER — Telehealth: Payer: Self-pay

## 2015-10-01 NOTE — Telephone Encounter (Signed)
-----   Message from Gordy Levan, MD sent at 10/01/2015  7:29 AM EST ----- Labs seen and need follow up: please let her know xray showed that the small bowel may be beginning to block again. She should NOT start reglan (metoclopramide). She should keep diet at liquids only thru weekend.  Please ask if paracentesis was helpful.  thanks

## 2015-10-01 NOTE — Telephone Encounter (Signed)
The paracentesis helped "immensely", 2600 ml fluid was removed, was able to urinate, had a small BM but is still taking magnesium and stool softener. She expressed understanding of Xray and Not taking reglan and keeping on liquid diet.

## 2015-10-04 ENCOUNTER — Encounter: Payer: 59 | Admitting: Nutrition

## 2015-10-04 ENCOUNTER — Ambulatory Visit: Payer: 59

## 2015-10-10 ENCOUNTER — Other Ambulatory Visit: Payer: Self-pay | Admitting: Oncology

## 2015-10-10 DIAGNOSIS — C482 Malignant neoplasm of peritoneum, unspecified: Secondary | ICD-10-CM

## 2015-10-12 ENCOUNTER — Telehealth: Payer: Self-pay | Admitting: Oncology

## 2015-10-12 ENCOUNTER — Other Ambulatory Visit (HOSPITAL_BASED_OUTPATIENT_CLINIC_OR_DEPARTMENT_OTHER): Payer: 59

## 2015-10-12 ENCOUNTER — Ambulatory Visit (HOSPITAL_BASED_OUTPATIENT_CLINIC_OR_DEPARTMENT_OTHER): Payer: 59 | Admitting: Oncology

## 2015-10-12 ENCOUNTER — Encounter: Payer: Self-pay | Admitting: Oncology

## 2015-10-12 VITALS — BP 124/77 | HR 126 | Temp 98.3°F | Resp 18 | Ht 67.0 in | Wt 144.0 lb

## 2015-10-12 DIAGNOSIS — C541 Malignant neoplasm of endometrium: Secondary | ICD-10-CM | POA: Diagnosis not present

## 2015-10-12 DIAGNOSIS — R180 Malignant ascites: Secondary | ICD-10-CM

## 2015-10-12 DIAGNOSIS — R634 Abnormal weight loss: Secondary | ICD-10-CM

## 2015-10-12 DIAGNOSIS — D6481 Anemia due to antineoplastic chemotherapy: Secondary | ICD-10-CM

## 2015-10-12 DIAGNOSIS — C482 Malignant neoplasm of peritoneum, unspecified: Secondary | ICD-10-CM | POA: Diagnosis not present

## 2015-10-12 DIAGNOSIS — D509 Iron deficiency anemia, unspecified: Secondary | ICD-10-CM

## 2015-10-12 DIAGNOSIS — Z66 Do not resuscitate: Secondary | ICD-10-CM

## 2015-10-12 DIAGNOSIS — A0472 Enterocolitis due to Clostridium difficile, not specified as recurrent: Secondary | ICD-10-CM

## 2015-10-12 DIAGNOSIS — Z720 Tobacco use: Secondary | ICD-10-CM

## 2015-10-12 DIAGNOSIS — J449 Chronic obstructive pulmonary disease, unspecified: Secondary | ICD-10-CM

## 2015-10-12 LAB — BASIC METABOLIC PANEL (CC13)
ANION GAP: 12 meq/L — AB (ref 3–11)
BUN: 12.2 mg/dL (ref 7.0–26.0)
CALCIUM: 7.8 mg/dL — AB (ref 8.4–10.4)
CO2: 27 meq/L (ref 22–29)
Chloride: 96 mEq/L — ABNORMAL LOW (ref 98–109)
Creatinine: 0.9 mg/dL (ref 0.6–1.1)
EGFR: 65 mL/min/{1.73_m2} — AB (ref >90)
Glucose: 82 mg/dl (ref 70–140)
Potassium: 4.1 mEq/L (ref 3.5–5.1)
SODIUM: 135 meq/L — AB (ref 136–145)

## 2015-10-12 LAB — MAGNESIUM (CC13): Magnesium: 0.9 mg/dl — CL (ref 1.5–2.5)

## 2015-10-12 NOTE — Progress Notes (Signed)
OFFICE PROGRESS NOTE   October 12, 2015   Physicians:Emma Glade Stanford  INTERVAL HISTORY:   Patient is seen, together with sister, in continuing attention to platinum refractory advanced primary peritoneal and endometrial carcinomas. She has not been stable to continue attempts at chemotherapy since doxil x 2 thru 08-09-15.   Paracentesis for 2.6 liters on 09-30-15 did help abdominal fullness, but did not improve po intake or intermittent vomiting. She has had diarrhea again in past week, tho no bowel movement in last 24 hours; she should have C diff rechecked if continued diarrhea. Even diarrhea seems to help abdominal discomfort more than no bowel movement, however. She has difficulty swallowing after first few bites, dislikes "textures" of food. She has no pain on swallowing or frank mucositis. She denies increased SOB. She has had no fever, no bleeding. She has intermittent abdominal pain, including after eating or drinking.  Remainder of 10 point Review of Systems unchanged/ negative.    PAC, flushed 09-23-15 Genetics testing Sent 09-06-15 normal (Custom Gene Panel by [facility]) Pre-operative CA 125 1941 on 01-07-15 Flu vaccine 08-19-15  ONCOLOGIC HISTORY Patient had no regular medical care since she lost medical insurance. She had progressive abdominal swelling over ~ 6 months as well as some postmenopausal bleeding when she was admitted to [facility] in 11-2014 with abdominal pain and presumed peritonitis, initially thought to be either gall bladder related or cirrhosis. Initial paracentesis 12-11-2014 was for 3.5 liters, with malignant cytology consistent with gyn malignancy. Notes refer to CT and MRI "which demonstrated omental thickening, extensive ascites, slight enlargement of the right adnexa and a thickened endometrium (1.1 cm)." She was referred to Dr Josephina Shih, seen on 12-25-14 with endometrial biopsy not possible due to cervical stenosis. She had second  paracentesis for 4.2 liters on 01-04-15. CA 125 on 01-07-15 was 1941 (pre op).She had TAH BSO, omentectomy and radical debulking by Dr Denman George on 01-12-15, with 4 liters of ascites at time of surgery. Findings at surgery were of milial tumor over entire small bowel serosa, mesentery, diaphragm, plaque on bladder and involving cul de sac peritoneum. Surgery was optimal R1 cytoreduction. Pathology [id number]) demonstrated high grade serous carcinoma, IIIC primary peritoneal and at least IB endometrial.. Recommendation was for 6 cycles of carboplatin and taxol, then likely whole pelvic RT + vaginal brachytherapy if she has CR. US paracentesis 01-29-15 for 1.5 liters. She began dose dense carboplatin taxol on 02-04-15, given thru day 1 cycle 6 on 05-27-15 (taxol held cycle 6 due to peripheral neuropathy). She was found to have progressive disease within 4 weeks of completing carbo taxol, when admitted with SBO early 06-2015. She began salvage doxil on 07-12-15. Cycle 3 doxil held 10-11-26-14 with C diff colitis.    Objective:  Vital signs in last 24 hours:  BP 124/77 mmHg  Pulse 126  Temp(Src) 98.3 F (36.8 C) (Oral)  Resp 18  Ht _0  (1.702 m)  Wt 144 lb (65.318 kg)  BMI 22.55 kg/m2  SpO2 99% Weight down 5 more lbs. Alert, oriented and appropriate. Ambulatory without assistance. Looks fatigued, weak, chronically ill but not in acute distress and respirations not labored RA. No alopecia  HEENT:PERRL, sclerae not icteric. Oral mucosa moist without lesions including no thrush, posterior pharynx clear. No JVD.  Lymphatics:no cervical,supraclavicular adenopathy Resp: somewhat diminished BS thruout otherwise clear to auscultation bilaterally and normal percussion bilaterally Cardio: regular rate and rhythm. No gallop. GI: soft, nontender,  distended but not as tight as prior to  paracentesis, full. Scattered minimal  bowel sounds.  Musculoskeletal/ Extremities: LE without pitting edema, cords, tenderness Neuro:  nonfocal. Psych appropriate mood and affect Skin without rash, ecchymosis, petechiae Portacath-without erythema or tenderness  Lab Results:  Results for orders placed or performed in visit on 16/10/96  Basic metabolic panel (Bmet) - [facility]  Result Value Ref Range   Sodium 135 (L) 136 - 145 mEq/L   Potassium 4.1 3.5 - 5.1 mEq/L   Chloride 96 (L) 98 - 109 mEq/L   CO2 27 22 - 29 mEq/L   Glucose 82 70 - 140 mg/dl   BUN 12.2 7.0 - 26.0 mg/dL   Creatinine 0.9 0.6 - 1.1 mg/dL   Calcium 7.8 (L) 8.4 - 10.4 mg/dL   Anion Gap 12 (H) 3 - 11 mEq/L   EGFR 65 (L) >90 ml/min/1.73 m2  Magnesium  Result Value Ref Range   Magnesium 0.9 (LL) 1.5 - 2.5 mg/dl   She will continue po magnesium as tolerated, will need to hold this if diarrhea  Studies/Results: EXAM: ULTRASOUND GUIDED PARACENTESIS  09-30-15  COMPARISON: None.  PROCEDURE: An ultrasound guided paracentesis was thoroughly discussed with the patient and questions answered. The benefits, risks, alternatives and complications were also discussed. The patient understands and wishes to proceed with the procedure. Written consent was obtained.  Ultrasound was performed to localize and mark an adequate pocket of fluid in the right lower quadrant of the abdomen. The area was then prepped and draped in the normal sterile fashion. 1% Lidocaine was used for local anesthesia. Under ultrasound guidance a 19 gauge Yueh catheter was introduced. Paracentesis was performed. The catheter was removed and a dressing applied.  COMPLICATIONS: None immediate  FINDINGS: A total of approximately 2.6 L of clear yellow fluid was removed. A fluid sample was not sent for laboratory analysis.  IMPRESSION: Successful ultrasound guided paracentesis yielding 2.6 L of ascites.  Medications: I have reviewed the patient's current medications. Oral magnesium as above.   DISCUSSION:  Sister more tearful than patient today. Offered FMLA to sister, however  she works 2 hours too few weekly to qualify; we have again tried to coordinate paracentesis and my next appointment with sister's work schedule.  Paracentesis can be repeated prn if she is uncomfortable again from malignant ascites. As she is aware of increasing ascites again now, we will request paracentesis for later next week; if she does not feel that this is necessary by then, can cancel. Feeding tube would not help, as bowel obstructions from the malignancy are lower than stomach; jejunostomy tube even more complicated than g-tube and also would not manage this problem (this question from son). Told patient again very clearly that she is not candidate for further chemotherapy, explaining again that less aggressive treatment at this point will give her better quality of life.  More lengthy discussion of Hospice: patient has not agreed to intake visit as she is ashamed of condition of her home, having been unable to clean house due to this lengthy illness. She spoke with Hospice intake team by phone, was told that they could not meet her at her mother's home, which is not presently her residence; if not for this, she does agree to hospice. Patient has not wanted her sister to assist with cleaning her home, tho sister has offered. After getting this new information, I have spoken with  Valentina Shaggy, Hospice RN manager, who has explained that it is Medicare regulation that intake visit be done at residence, as this  includes safety evaluation of home etc. Have discussed further with patient and sister, hopefully will let sister assist at home and be able for Hospice to see her ~ next week. Patient again requests that she contact Hospice for the appointment when she is ready.  NOTE her children are having difficulty with her illness and she is particularly close to and worried about her 2 young grandsons ages 26 and ~7, one of whom has begun [place].    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 Progressed around completion of adjuvant carbo taxol. Salvage doxil begun 07-12-15. Ileus, partial SBO symptoms intermittently so not candidate for avastin. Cycle 3 doxil held 09-06-15 with C diff. Unfortunately status not better overall since treatment of C diff, with PS poor and significant cancer related symptoms. Will not pursue further chemotherapy. US paracentesis next week if needed. Social concerns hindering Hospice referral, see above. Genetics testing negative 2. C diff colitis: C diff antigen + with toxin negative, however with symptoms was treated with Flagyl 500 mg tid x 2 weeks, finished ~ 09-30-15. Will need to retest C diff if diarrhea reoccurs  3.SBO during recent hospitalization and self-limited symptoms before and since the hospitalization. Ileus. Should not use medications that slow bowel motility for this reason or with C diff 4.PAC in 5. Hypomagnesemia, tho K fine: no improvement after IV magnesium recently. She will resume po magnesium as tolerated.  6.long past tobacco, COPD 7. Iron deficiency + chemo anemia: post IV feraheme.  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 Post partial thyroidectomy for goiter ~ 1980, on medication until ~ 1 year ago. PCP managing. 10. DNR per patient's request 11.flu vaccine done 08-19-15  All questions answered, patient and sister express appreciation for care. I will see her again in ~ 3 weeks, tho can move that appointment if Hospice involved / if not needed by then. Time spent 30 min including >50% counseling and coordination of care.  Cc Dr Philip Aspen and Hospice MD   Gordy Levan, MD   10/12/2015, 7:27 PM

## 2015-10-12 NOTE — Telephone Encounter (Signed)
Appointments made and and avs is printed for pateint

## 2015-10-15 DIAGNOSIS — C482 Malignant neoplasm of peritoneum, unspecified: Secondary | ICD-10-CM | POA: Insufficient documentation

## 2015-10-15 DIAGNOSIS — Z66 Do not resuscitate: Secondary | ICD-10-CM | POA: Insufficient documentation

## 2015-10-20 ENCOUNTER — Telehealth: Payer: Self-pay | Admitting: *Deleted

## 2015-10-20 DIAGNOSIS — IMO0001 Reserved for inherently not codable concepts without codable children: Secondary | ICD-10-CM

## 2015-10-20 DIAGNOSIS — R197 Diarrhea, unspecified: Secondary | ICD-10-CM

## 2015-10-20 NOTE — Telephone Encounter (Signed)
Called patient as noted below by Dr. Marko Plume. Left VM requesting return call from patient.

## 2015-10-20 NOTE — Telephone Encounter (Addendum)
Returned call to patient to let her know Dr. Marko Plume would like to collect stool sample to check for C.Diff. Patient states she will get her sister to pickup collection kit and then return stool sample to [facility] lab during work hours. Told patient that if it will be more than an hour or two after sample is collected that she will need to put stool sample in the refrigerator until her sister can bring it to [facility] lab. Lab order placed. Patient verbalizes understanding and will call our office with any additional questions or concerns.

## 2015-10-20 NOTE — Telephone Encounter (Signed)
-----   Message from Gordy Levan, MD sent at 10/15/2015  7:58 PM EST ----- Please check on her ~11-28 or 11-29  If diarrhea, maybe sister can pick up container and script for C diff, as I did not give these to them at visit 11-22 Ask if she is able to make apt with Hospice (see my note 11-22). If not yet, please ask Polo Riley or chaplain to follow up by phone this week (they will need to know information in my note about house).  thanks

## 2015-10-20 NOTE — Telephone Encounter (Deleted)
-----   Message from Gordy Levan, MD sent at 10/20/2015  3:52 PM EST ----- I would rather just check C diff to be sure, since that is something we can treat if + Thank you! ----- Message -----    From: Christa See, RN    Sent: 10/20/2015   3:24 PM      To: Gordy Levan, MD  Reached Ms. Lanter this afternoon. She seemed to be doing ok. Has not contacted Hospice yet but said she will this week. You note made me soo soo sad about her house:( I asked about SW/chaplain very gently and she did not want either to contact her. She did go to [employer] and make christmas ornaments this week with her grandsons. I encouraged her to reach out to Hospice again....  She said she is having a few episodes of diarrhea here and there but nothing like when she had C. Diff several weeks ago. She said she will go 1-2 days with no BM and then have a little bit of diarrhea. Do you want to collect C.Diff still?  ----- Message -----    From: Gordy Levan, MD    Sent: 10/15/2015   7:58 PM      To: Baruch Merl, RN, Christa See, RN  Please check on her ~11-28 or 11-29  If diarrhea, maybe sister can pick up container and script for C diff, as I did not give these to them at visit 11-22 Ask if she is able to make apt with Hospice (see my note 11-22). If not yet, please ask Polo Riley or chaplain to follow up by phone this week (they will need to know information in my note about house).  thanks

## 2015-10-22 ENCOUNTER — Ambulatory Visit (HOSPITAL_COMMUNITY)
Admission: RE | Admit: 2015-10-22 | Discharge: 2015-10-22 | Disposition: A | Discharge status: Home / Self Care | Payer: 59 | Source: Ambulatory Visit | Attending: Oncology | Admitting: Oncology

## 2015-10-22 DIAGNOSIS — R180 Malignant ascites: Secondary | ICD-10-CM | POA: Insufficient documentation

## 2015-10-25 ENCOUNTER — Telehealth: Payer: Self-pay

## 2015-10-25 DIAGNOSIS — R059 Cough, unspecified: Secondary | ICD-10-CM

## 2015-10-25 DIAGNOSIS — R05 Cough: Secondary | ICD-10-CM

## 2015-10-25 MED ORDER — AZITHROMYCIN 250 MG PO TABS: ORAL_TABLET | ORAL | Status: DC

## 2015-10-25 MED ORDER — BENZONATATE 100 MG PO CAPS: 200.0000 mg | ORAL_CAPSULE | Freq: Three times a day (TID) | ORAL | Status: AC | PRN

## 2015-10-25 NOTE — Telephone Encounter (Signed)
Evette from [facility] called stating that pt contacted them today and is ready for them to see her. Original order had outdated. Per Dr Edwyna Shell verbal order given for hospice referral. Dr Edwyna Shell attending with symptom management per hospice MDs. Informed Evette about laryngitis and order for z-pack and tessalon. Pt is going to be in her own home. Hospice is scheduled to see pt tomorrow at 3 pm.

## 2015-10-25 NOTE — Telephone Encounter (Signed)
Pt called with laryngitis, possible dehydration and dry heaves.

## 2015-10-25 NOTE — Telephone Encounter (Signed)
Started sometime Saturday morning. Coughing also, and coughing will trigger dry heaves. Pt feels a little warm but has not taken temperature. Have not been eating or drinking much. 32 oz of water per day. Tessalon helped only a little bit. Mucinex dried her out.

## 2015-10-25 NOTE — Telephone Encounter (Signed)
S/w Dr Marko Plume, will Rx tessalon perles and a z-pack. Called pt back with instructions.

## 2015-11-01 ENCOUNTER — Telehealth: Payer: Self-pay

## 2015-11-01 DIAGNOSIS — R197 Diarrhea, unspecified: Secondary | ICD-10-CM

## 2015-11-01 MED ORDER — METRONIDAZOLE 500 MG PO TABS: 500.0000 mg | ORAL_TABLET | Freq: Three times a day (TID) | ORAL | Status: AC

## 2015-11-01 NOTE — Telephone Encounter (Signed)
Pt had hospice call to ask [place] to call pt. Pt is having uncontrolled diarrhea  And is feeling dehydrated. Called pt back and she had 10-12 watery stools during the night and had incontinence. Has had 4 stools today that are soft. No fever, no nausea, did vomit x1 this am.

## 2015-11-01 NOTE — Telephone Encounter (Signed)
S/w Dr Edwyna Shell: we will order flagyl to be taken, we will call hospice to collect the stool sample. The pt will then start flagyl. We will s/w hospice about hydration in the home, letting the hospice MD manage dehyration. Called hospice and they cannot really do home infusions b/c it holds up their nurses to long. Bernerd Pho RN will be seeing pt this afternoon and will try to collect stool sample, and will evaluate hydration, she will encourage sips of fluids every 15 to 30 minutes throughout the day.

## 2015-11-02 ENCOUNTER — Other Ambulatory Visit: Payer: Self-pay | Admitting: Oncology

## 2015-11-02 DIAGNOSIS — C482 Malignant neoplasm of peritoneum, unspecified: Secondary | ICD-10-CM

## 2015-11-02 MED ORDER — SODIUM CHLORIDE 0.9 % IV SOLN: INTRAVENOUS | Status: DC

## 2015-11-02 NOTE — Telephone Encounter (Signed)
Spoke with Ms. Trease.  She stated that the diarrhea has slowed down a little since starting the flagyl yesterday. She states that she is not taking in a lot of fluids.  She has not kept track of the amount.   Tracey RN with Hospice stated that her skin was dry but not tenting when she saw her yesterday.  Stool specimen obtained and sent to [facility] yesterday Phone: [phone]. Called [facility] for result of C-diff. The stool test ordered was a stool culture not C-diff.  Added cidiff.  This is not done stat.  The specimen  will be run between 0500 -0700 11-03-15 per lab tech in micro lab. Gave phone number for [phone] to call results. Ms. Trolinger is scheduled for IV fluids at [facility] tomorrow 11-03-15 at 2 pm.  She is to arrive at 1:45 pm.  Told Ms. Pfannenstiel that the contact precautions will be used unless the C-diff status is know prior to her appointment and is negative.  Ms. Malave verbalized understanding.

## 2015-11-03 ENCOUNTER — Other Ambulatory Visit: Payer: Self-pay | Admitting: Oncology

## 2015-11-03 ENCOUNTER — Ambulatory Visit (HOSPITAL_BASED_OUTPATIENT_CLINIC_OR_DEPARTMENT_OTHER): Payer: 59

## 2015-11-03 ENCOUNTER — Other Ambulatory Visit: Payer: Self-pay | Admitting: *Deleted

## 2015-11-03 VITALS — BP 119/67 | HR 97 | Temp 97.7°F | Resp 18

## 2015-11-03 DIAGNOSIS — C482 Malignant neoplasm of peritoneum, unspecified: Secondary | ICD-10-CM

## 2015-11-03 DIAGNOSIS — E860 Dehydration: Secondary | ICD-10-CM

## 2015-11-03 MED ORDER — SODIUM CHLORIDE 0.9 % IV SOLN
Freq: Once | INTRAVENOUS | Status: AC
  Administered 2015-11-03: 15:00:00 via INTRAVENOUS
  Filled 2015-11-03: qty 1000

## 2015-11-03 MED ORDER — POTASSIUM CHLORIDE 2 MEQ/ML IV SOLN: INTRAVENOUS | Status: DC

## 2015-11-03 MED ORDER — SODIUM CHLORIDE 0.9 % IJ SOLN
10.0000 mL | INTRAMUSCULAR | Status: DC | PRN
  Administered 2015-11-03: 10 mL via INTRAVENOUS
  Filled 2015-11-03: qty 10

## 2015-11-03 MED ORDER — HEPARIN SOD (PORK) LOCK FLUSH 100 UNIT/ML IV SOLN
500.0000 [IU] | Freq: Once | INTRAVENOUS | Status: AC
  Administered 2015-11-03: 500 [IU] via INTRAVENOUS
  Filled 2015-11-03: qty 5

## 2015-11-03 NOTE — Patient Instructions (Addendum)
Dehydration, Adult Dehydration is a condition in which you do not have enough fluid or water in your body. It happens when you take in less fluid than you lose. Vital organs such as the kidneys, brain, and heart cannot function without a proper amount of fluids. Any loss of fluids from the body can cause dehydration.  Dehydration can range from mild to severe. This condition should be treated right away to help prevent it from becoming severe. CAUSES  This condition may be caused by:  Vomiting.  Diarrhea.  Excessive sweating, such as when exercising in hot or humid weather.  Not drinking enough fluid during strenuous exercise or during an illness.  Excessive urine output.  Fever.  Certain medicines. RISK FACTORS This condition is more likely to develop in:  People who are taking certain medicines that cause the body to lose excess fluid (diuretics).   People who have a chronic illness, such as diabetes, that may increase urination.  Older adults.   People who live at high altitudes.   People who participate in endurance sports.  SYMPTOMS  Mild Dehydration  Thirst.  Dry lips.  Slightly dry mouth.  Dry, warm skin. Moderate Dehydration  Very dry mouth.   Muscle cramps.   Dark urine and decreased urine production.   Decreased tear production.   Headache.   Light-headedness, especially when you stand up from a sitting position.  Severe Dehydration  Changes in skin.   Cold and clammy skin.   Skin does not spring back quickly when lightly pinched and released.   Changes in body fluids.   Extreme thirst.   No tears.   Not able to sweat when body temperature is high, such as in hot weather.   Minimal urine production.   Changes in vital signs.   Rapid, weak pulse (more than 100 beats per minute when you are sitting still).   Rapid breathing.   Low blood pressure.   Other changes.   Sunken eyes.   Cold hands and feet.    Confusion.  Lethargy and difficulty being awakened.  Fainting (syncope).   Short-term weight loss.   Unconsciousness. DIAGNOSIS  This condition may be diagnosed based on your symptoms. You may also have tests to determine how severe your dehydration is. These tests may include:   Urine tests.   Blood tests.  TREATMENT  Treatment for this condition depends on the severity. Mild or moderate dehydration can often be treated at home. Treatment should be started right away. Do not wait until dehydration becomes severe. Severe dehydration needs to be treated at the hospital. Treatment for Mild Dehydration  Drinking plenty of water to replace the fluid you have lost.   Replacing minerals in your blood (electrolytes) that you may have lost.  Treatment for Moderate Dehydration  Consuming oral rehydration solution (ORS). Treatment for Severe Dehydration  Receiving fluid through an IV tube.   Receiving electrolyte solution through a feeding tube that is passed through your nose and into your stomach (nasogastric tube or NG tube).  Correcting any abnormalities in electrolytes. HOME CARE INSTRUCTIONS   Drink enough fluid to keep your urine clear or pale yellow.   Drink water or fluid slowly by taking small sips. You can also try sucking on ice cubes.  Have food or beverages that contain electrolytes. Examples include bananas and sports drinks.  Take over-the-counter and prescription medicines only as told by your health care provider.   Prepare ORS according to the manufacturer's instructions. Take sips   of ORS every 5 minutes until your urine returns to normal.  If you have vomiting or diarrhea, continue to try to drink water, ORS, or both.   If you have diarrhea, avoid:   Beverages that contain caffeine.   Fruit juice.   Milk.   Carbonated soft drinks.  Do not take salt tablets. This can lead to the condition of having too much sodium in your body  (hypernatremia).  SEEK MEDICAL CARE IF:  You cannot eat or drink without vomiting.  You have had moderate diarrhea during a period of more than 24 hours.  You have a fever. SEEK IMMEDIATE MEDICAL CARE IF:   You have extreme thirst.  You have severe diarrhea.  You have not urinated in 6-8 hours, or you have urinated only a small amount of very dark urine.  You have shriveled skin.  You are dizzy, confused, or both.   This information is not intended to replace advice given to you by your health care provider. Make sure you discuss any questions you have with your health care provider.   Document Released: 11/06/2005 Document Revised: 07/28/2015 Document Reviewed: 03/24/2015 Elsevier Interactive Patient Education 2016 [employer].  Hypokalemia Hypokalemia means that the amount of potassium in the blood is lower than normal.Potassium is a chemical, called an electrolyte, that helps regulate the amount of fluid in the body. It also stimulates muscle contraction and helps nerves function properly.Most of the body's potassium is inside of cells, and only a very small amount is in the blood. Because the amount in the blood is so small, minor changes can be life-threatening. CAUSES  Antibiotics.  Diarrhea or vomiting.  Using laxatives too much, which can cause diarrhea.  Chronic kidney disease.  Water pills (diuretics).  Eating disorders (bulimia).  Low magnesium level.  Sweating a lot. SIGNS AND SYMPTOMS  Weakness.  Constipation.  Fatigue.  Muscle cramps.  Mental confusion.  Skipped heartbeats or irregular heartbeat (palpitations).  Tingling or numbness. DIAGNOSIS  Your health care provider can diagnose hypokalemia with blood tests. In addition to checking your potassium level, your health care provider may also check other lab tests. TREATMENT Hypokalemia can be treated with potassium supplements taken by mouth or adjustments in your current medicines. If  your potassium level is very low, you may need to get potassium through a vein (IV) and be monitored in the hospital. A diet high in potassium is also helpful. Foods high in potassium are:  Nuts, such as peanuts and pistachios.  Seeds, such as sunflower seeds and pumpkin seeds.  Peas, lentils, and lima beans.  Whole grain and bran cereals and breads.  Fresh fruit and vegetables, such as apricots, avocado, bananas, cantaloupe, kiwi, oranges, tomatoes, asparagus, and potatoes.  Orange and tomato juices.  Red meats.  Fruit yogurt. HOME CARE INSTRUCTIONS  Take all medicines as prescribed by your health care provider.  Maintain a healthy diet by including nutritious food, such as fruits, vegetables, nuts, whole grains, and lean meats.  If you are taking a laxative, be sure to follow the directions on the label. SEEK MEDICAL CARE IF:  Your weakness gets worse.  You feel your heart pounding or racing.  You are vomiting or having diarrhea.  You are diabetic and having trouble keeping your blood glucose in the normal range. SEEK IMMEDIATE MEDICAL CARE IF:  You have chest pain, shortness of breath, or dizziness.  You are vomiting or having diarrhea for more than 2 days.  You faint. MAKE SURE  YOU:   Understand these instructions.  Will watch your condition.  Will get help right away if you are not doing well or get worse.   This information is not intended to replace advice given to you by your health care provider. Make sure you discuss any questions you have with your health care provider.   Document Released: 11/06/2005 Document Revised: 11/27/2014 Document Reviewed: 05/09/2013 Elsevier Interactive Patient Education 2016 [employer].  Hypomagnesemia Hypomagnesemia is a condition in which the level of magnesium in the blood is low. Magnesium is a mineral that is found in many foods. It is used in many different processes in the body. Hypomagnesemia can affect every  organ in the body. It can cause life-threatening problems. CAUSES Causes of hypomagnesemia include:  Not getting enough magnesium in your diet.  Malnutrition.  Problems with absorbing magnesium from the intestines.  Dehydration.  Alcohol abuse.  Vomiting.  Severe diarrhea.  Some medicines, including medicines that make you urinate more.  Certain diseases, such as kidney disease, diabetes, and overactive thyroid. SIGNS AND SYMPTOMS  Involuntary shaking or trembling of a body part (tremor).  Confusion.  Muscle weakness.  Sensitivity to light, sound, and touch.  Psychiatric issues, such as depression, irritability, or psychosis.  Sudden tightening of muscles (muscle spasms).  Tingling in the arms and legs.  A feeling of fluttering of the heart. These symptoms are more severe if magnesium levels drop suddenly. DIAGNOSIS To make a diagnosis, your health care provider will do a physical exam and order blood and urine tests. TREATMENT Treatment will depend on the cause and the severity of your condition. It may involve:  A magnesium supplement. This can be taken in pill form. It can also be given through an IV tube. This is usually done if the condition is severe.  Changes to your diet. You may be directed to eat foods that have a lot of magnesium, such as green leafy vegetables, peas, beans, and nuts.  Eliminating alcohol from your diet. HOME CARE INSTRUCTIONS  Include foods with magnesium in your diet. Foods that are rich in magnesium include green vegetables, beans, nuts and seeds, and whole grains.  Take medicines only as directed by your health care provider.  Take magnesium supplements if your health care provider instructs you to do that. Take them as directed.  Have your magnesium levels monitored as directed by your health care provider.  When you are active, drink fluids that contain electrolytes.  Keep all follow-up visits as directed by your health  care provider. This is important. SEEK MEDICAL CARE IF:  You get worse instead of better.  Your symptoms return. SEEK IMMEDIATE MEDICAL CARE IF:  Your symptoms are severe.   This information is not intended to replace advice given to you by your health care provider. Make sure you discuss any questions you have with your health care provider.   Document Released: 08/02/2005 Document Revised: 11/27/2014 Document Reviewed: 06/22/2014 Elsevier Interactive Patient Education [employer].

## 2015-11-03 NOTE — Telephone Encounter (Addendum)
Spoke with Nira Conn in Microbiology at [facility] lab and she said that the stool for  C-diff was negative. Told Ms. Rodenbeck that stool was negative as well as the infusion room Charge nurse for today 11-03-15-Vanessa.

## 2015-11-07 ENCOUNTER — Other Ambulatory Visit: Payer: Self-pay | Admitting: Oncology

## 2015-11-07 DIAGNOSIS — C482 Malignant neoplasm of peritoneum, unspecified: Secondary | ICD-10-CM

## 2015-11-08 ENCOUNTER — Other Ambulatory Visit (HOSPITAL_BASED_OUTPATIENT_CLINIC_OR_DEPARTMENT_OTHER): Payer: 59

## 2015-11-08 ENCOUNTER — Ambulatory Visit (HOSPITAL_BASED_OUTPATIENT_CLINIC_OR_DEPARTMENT_OTHER): Payer: 59 | Admitting: Oncology

## 2015-11-08 ENCOUNTER — Telehealth: Payer: Self-pay | Admitting: Oncology

## 2015-11-08 ENCOUNTER — Ambulatory Visit (HOSPITAL_BASED_OUTPATIENT_CLINIC_OR_DEPARTMENT_OTHER): Payer: 59

## 2015-11-08 ENCOUNTER — Encounter: Payer: Self-pay | Admitting: Oncology

## 2015-11-08 VITALS — BP 131/85 | HR 102 | Temp 97.4°F | Resp 18 | Ht 67.0 in | Wt 147.6 lb

## 2015-11-08 DIAGNOSIS — R197 Diarrhea, unspecified: Secondary | ICD-10-CM

## 2015-11-08 DIAGNOSIS — C482 Malignant neoplasm of peritoneum, unspecified: Secondary | ICD-10-CM

## 2015-11-08 DIAGNOSIS — E876 Hypokalemia: Secondary | ICD-10-CM | POA: Diagnosis not present

## 2015-11-08 DIAGNOSIS — R180 Malignant ascites: Secondary | ICD-10-CM

## 2015-11-08 DIAGNOSIS — C541 Malignant neoplasm of endometrium: Secondary | ICD-10-CM | POA: Diagnosis not present

## 2015-11-08 DIAGNOSIS — Z95828 Presence of other vascular implants and grafts: Secondary | ICD-10-CM

## 2015-11-08 DIAGNOSIS — G620 Drug-induced polyneuropathy: Secondary | ICD-10-CM

## 2015-11-08 DIAGNOSIS — C539 Malignant neoplasm of cervix uteri, unspecified: Secondary | ICD-10-CM

## 2015-11-08 DIAGNOSIS — D6481 Anemia due to antineoplastic chemotherapy: Secondary | ICD-10-CM

## 2015-11-08 DIAGNOSIS — D509 Iron deficiency anemia, unspecified: Secondary | ICD-10-CM | POA: Diagnosis not present

## 2015-11-08 LAB — BASIC METABOLIC PANEL
Anion Gap: 10 mEq/L (ref 3–11)
BUN: 8.9 mg/dL (ref 7.0–26.0)
CALCIUM: 6.7 mg/dL — AB (ref 8.4–10.4)
CO2: 25 mEq/L (ref 22–29)
CREATININE: 0.8 mg/dL (ref 0.6–1.1)
Chloride: 98 mEq/L (ref 98–109)
EGFR: 80 mL/min/{1.73_m2} — ABNORMAL LOW (ref >90)
Glucose: 122 mg/dl (ref 70–140)
Potassium: 2.9 mEq/L — CL (ref 3.5–5.1)
SODIUM: 132 meq/L — AB (ref 136–145)

## 2015-11-08 MED ORDER — HEPARIN SOD (PORK) LOCK FLUSH 100 UNIT/ML IV SOLN
500.0000 [IU] | Freq: Once | INTRAVENOUS | Status: AC
  Administered 2015-11-08: 500 [IU] via INTRAVENOUS
  Filled 2015-11-08: qty 5

## 2015-11-08 MED ORDER — SODIUM CHLORIDE 0.9 % IJ SOLN
10.0000 mL | INTRAMUSCULAR | Status: DC | PRN
  Administered 2015-11-08: 10 mL via INTRAVENOUS
  Filled 2015-11-08: qty 10

## 2015-11-08 NOTE — Progress Notes (Signed)
Pt port accessed for lab work. de accessed port after blood draws performed. Pt had no issues today.

## 2015-11-08 NOTE — Telephone Encounter (Signed)
Appointments made and avs printed for patient °

## 2015-11-08 NOTE — Progress Notes (Signed)
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 She has not been able to continue chemotherapy since doxil x 2 thru 08-09-15. Last paracentesis was for 2 liters on 10-22-15, which did help symptoms. Fluid is accumulating noticeably again and we will request US paracentesis again this week.   Patient recently agreed to [facility] referral, and tells me that she understands and is in agreement with recommendation for comfort care. Sister has had more difficult time accepting Hospice.  Patient had diarrhea ~ 12-12, with C diff fortunately negative on 12-14, Flagyl used briefly while C diff testing in process. She and family requested IVF, given at this office on 11-03-15, did seem to help symptoms a little. She has had no diarrhea in last few days, bowels are moving. She is now using lorazepam and oxycodone before attempting to eat, still has pain after eating and intermittent vomiting. She denies increased SOB or other respiratory symptoms since Z pack recently for cough. Bladder ok. No fever. No bleeding. Is able to sleep. 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  Remainder of 10 point Review of Systems negative/ unchanged.   PAC, flushed 11-08-15 Genetics testing Sent 09-06-15 normal (Custom Gene Panel by [facility]) Pre-operative CA 125 1941 on 01-07-15 Flu vaccine 08-19-15  ONCOLOGIC HISTORY  Patient had no regular medical care since she lost medical insurance. She had progressive abdominal swelling over ~ 6 months as well as some postmenopausal bleeding when she was admitted to [facility] in 11-2014 with abdominal pain and presumed peritonitis, initially thought to be either gall bladder related or cirrhosis. Initial paracentesis 12-11-2014 was  for 3.5 liters, with malignant cytology consistent with gyn malignancy. Notes refer to CT and MRI "which demonstrated omental thickening, extensive ascites, slight enlargement of the right adnexa and a thickened endometrium (1.1 cm)." She was referred to Dr Josephina Shih, seen on 12-25-14 with endometrial biopsy not possible due to cervical stenosis. She had second paracentesis for 4.2 liters on 01-04-15. CA 125 on 01-07-15 was 1941 (pre op).She had TAH BSO, omentectomy and radical debulking by Dr Denman George on 01-12-15, with 4 liters of ascites at time of surgery. Findings at surgery were of milial tumor over entire small bowel serosa, mesentery, diaphragm, plaque on bladder and involving cul de sac peritoneum. Surgery was optimal R1 cytoreduction. Pathology [phone]) demonstrated high grade serous carcinoma, IIIC primary peritoneal and at least IB endometrial.. Recommendation was for 6 cycles of carboplatin and taxol, then likely whole pelvic RT + vaginal brachytherapy if she has CR. US paracentesis 01-29-15 for 1.5 liters. She began dose dense carboplatin taxol on 02-04-15, given thru day 1 cycle 6 on 05-27-15 (taxol held cycle 6 due to peripheral neuropathy). She was found to have progressive disease within 4 weeks of completing carbo taxol, when admitted with SBO early 06-2015. She began salvage doxil on 07-12-15. Cycle 3 doxil held 10-11-26-14 with C diff colitis. Not able to continue with additional treatment, hospice referral made 10-2015.   Objective:  Vital signs in last 24 hours:  BP 131/85 mmHg  Pulse 102  Temp(Src) 97.4 F (36.3 C) (Oral)  Resp 18  Ht 5' 7"  (1.702 m)  Wt 147 lb 9.6 oz (66.951 kg)  BMI 23.11 kg/m2  SpO2 100% Weight up 3.5 lbs Alert, oriented and appropriate.  Ambulatory. Respirations not labored RA. NAD today. No alopecia  HEENT:PERRL, sclerae not icteric. Oral mucosa somewhat day without lesions, posterior pharynx clear.  Neck supple. No JVD.  Lymphatics:no  cervical,supraclavicular adenopathy Resp: somewhat diminished BS thruout without wheezes or rales, no dullness to percussion bilaterally Cardio: regular rate and rhythm. No gallop. GI: not tender, distended not quite tight. A few bowel sounds.  Musculoskeletal/ Extremities: LE without pitting edema, cords, tenderness Neuro: no change peripheral neuropathy. Otherwise nonfocal . PSYCH appropriate mood and affect Skin without rash, ecchymosis, petechiae Breasts: without dominant mass, skin or nipple findings. Axillae benign. Portacath-without erythema or tenderness, flushed today  Lab Results:  Results for orders placed or performed in visit on 81/10/31  Basic metabolic panel  Result Value Ref Range   Sodium 132 (L) 136 - 145 mEq/L   Potassium 2.9 (LL) 3.5 - 5.1 mEq/L   Chloride 98 98 - 109 mEq/L   CO2 25 22 - 29 mEq/L   Glucose 122 70 - 140 mg/dl   BUN 8.9 7.0 - 26.0 mg/dL   Creatinine 0.8 0.6 - 1.1 mg/dL   Calcium 6.7 (L) 8.4 - 10.4 mg/dL   Anion Gap 10 3 - 11 mEq/L   EGFR 80 (L) >90 ml/min/1.73 m2     Studies/Results:  No results found.  Medications: I have reviewed the patient's current medications. She has not been taking K but has this available and will resume at 20 mEq daily,  Magnesium also low previously, but with intermittent diarrhea not easily able to attempt oral magnesium  DISCUSSION As Hospice RN had called me directly during intake visit due to sister's concerns, I have told them again that with gyn malignancy refractory to platin and taxane, and no improvement with 2 cycles doxil, chance of temporary benefit from other agents that are not contraindicated is <10-15% , with side effects from all agents. She really is not strong enough to attempt treatment, which would also worsen quality of life now. Patient and sister understand, tho we all wish this was not the case. I have told them again that, in this situation, very gentle care can actually be most helpful. She  prefers another scheduled appointment, tho we can move this if needed. Prn paracenteses fine for symptoms.  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 Progressed around completion of adjuvant carbo taxol. Salvage doxil begun 07-12-15. Ileus, partial SBO symptoms intermittently so not candidate for avastin. Cycle 3 doxil held 09-06-15 with C diff. Unfortunately status not better overall since treatment of C diff, with PS poor and significant cancer related symptoms. Will not pursue further chemotherapy. Appreciate Hospice assistance. Prn [place] paracenteses, including later this week. I will see her back late Jan with PAC flush, tho can move that appointment if not correct at that time. Genetics testing negative 2. C diff colitis: C diff antigen + with toxin negative, however with symptoms was treated with Flagyl 500 mg tid x 2 weeks, finished ~ 09-30-15. Retest C diff negative 11-03-15 so Flagyl DCd.  3.SBO symptoms intermittently x last several months. Ileus. Should not use medications that slow bowel motility for this reason if can be avoided 4.PAC in 5. Hypomagnesemia, now hypo K : no improvement after IV magnesium recently. Resume po potassium and use po magnesium as tolerated.  6.long past tobacco, COPD 7. Iron deficiency +  chemo anemia: post IV feraheme.  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 Post partial thyroidectomy for goiter ~ 1980, on medication until ~ 1 year ago. PCP managing. 10. DNR per patient's request 11.flu vaccine done 08-19-15   All questions answered and they know to call here or hospice at any time if needed. Time spent 20 min including >50% counseling and coordination of care. Cc Dr Candace Gallus, MD   11/08/2015, 6:25  PM

## 2015-11-12 ENCOUNTER — Ambulatory Visit (HOSPITAL_COMMUNITY)
Admission: RE | Admit: 2015-11-12 | Discharge: 2015-11-12 | Disposition: A | Discharge status: Home / Self Care | Payer: 59 | Source: Ambulatory Visit | Attending: Oncology | Admitting: Oncology

## 2015-11-12 DIAGNOSIS — Z8589 Personal history of malignant neoplasm of other organs and systems: Secondary | ICD-10-CM | POA: Diagnosis not present

## 2015-11-12 DIAGNOSIS — Z8542 Personal history of malignant neoplasm of other parts of uterus: Secondary | ICD-10-CM | POA: Insufficient documentation

## 2015-11-12 DIAGNOSIS — R180 Malignant ascites: Secondary | ICD-10-CM | POA: Diagnosis present

## 2015-11-12 NOTE — Procedures (Signed)
Ultrasound-guided therapeutic paracentesis performed yielding 1.8 L yellow fluid. No immediate complications.

## 2015-11-24 ENCOUNTER — Encounter (HOSPITAL_COMMUNITY): Payer: Self-pay

## 2015-11-25 ENCOUNTER — Telehealth: Payer: Self-pay

## 2015-11-25 DIAGNOSIS — C482 Malignant neoplasm of peritoneum, unspecified: Secondary | ICD-10-CM

## 2015-11-25 NOTE — Telephone Encounter (Signed)
Pt called in requesting a paracentesis. Her abdomen is getting large again. She would like it before the weekend if possible. S/w Dr Jana Hakim and order placed for US paracentesis.  Returned call and left voice message that she is scheduled for Tuesday 10th at 2 pm, pt to arrive at 145 pm. This was soonest available.  If pt has worsening symptoms to call us or go to ER.

## 2015-11-26 ENCOUNTER — Telehealth: Payer: Self-pay | Admitting: *Deleted

## 2015-11-26 NOTE — Telephone Encounter (Signed)
Pt returned call from yesterday about request for paracentesis. Nurse tried to call pt yesterday with details, no answer. I told pt that her appt would be next Tuesday 11/30/15 @2 :00p and for her to arrive at 1:45p at [facility]. Pt verbalized understanding. No further concerns.

## 2015-11-30 ENCOUNTER — Ambulatory Visit (HOSPITAL_COMMUNITY)
Admission: RE | Admit: 2015-11-30 | Discharge: 2015-11-30 | Disposition: A | Discharge status: Home / Self Care | Payer: BLUE CROSS/BLUE SHIELD | Source: Ambulatory Visit | Attending: Oncology | Admitting: Oncology

## 2015-11-30 DIAGNOSIS — C482 Malignant neoplasm of peritoneum, unspecified: Secondary | ICD-10-CM | POA: Insufficient documentation

## 2015-11-30 DIAGNOSIS — R180 Malignant ascites: Secondary | ICD-10-CM | POA: Insufficient documentation

## 2015-11-30 NOTE — Procedures (Signed)
Ultrasound-guided therapeutic paracentesis performed yielding 3.1 L yellow fluid. No immediate complications.

## 2015-12-14 ENCOUNTER — Other Ambulatory Visit: Payer: Self-pay | Admitting: *Deleted

## 2015-12-14 ENCOUNTER — Telehealth: Payer: Self-pay | Admitting: *Deleted

## 2015-12-14 DIAGNOSIS — N882 Stricture and stenosis of cervix uteri: Secondary | ICD-10-CM

## 2015-12-14 DIAGNOSIS — R180 Malignant ascites: Secondary | ICD-10-CM

## 2015-12-14 DIAGNOSIS — N950 Postmenopausal bleeding: Secondary | ICD-10-CM

## 2015-12-14 DIAGNOSIS — N390 Urinary tract infection, site not specified: Secondary | ICD-10-CM

## 2015-12-14 MED ORDER — LORAZEPAM 1 MG PO TABS: ORAL_TABLET | ORAL | Status: AC

## 2015-12-14 NOTE — Telephone Encounter (Signed)
Refill for Lorazepam has been called in to [facility] @ [place].

## 2015-12-14 NOTE — Telephone Encounter (Signed)
Re refill of lorazepam per hospice reguest   ok

## 2015-12-14 NOTE — Telephone Encounter (Signed)
Hospice RN calling in to request refill of pt's Lorazepam.

## 2015-12-16 ENCOUNTER — Telehealth: Payer: Self-pay

## 2015-12-16 ENCOUNTER — Other Ambulatory Visit: Payer: Self-pay

## 2015-12-16 DIAGNOSIS — R180 Malignant ascites: Secondary | ICD-10-CM

## 2015-12-16 DIAGNOSIS — C482 Malignant neoplasm of peritoneum, unspecified: Secondary | ICD-10-CM

## 2015-12-16 NOTE — Telephone Encounter (Signed)
Jessica Payne requested a paracentesis asap. Told her that she is scheduled for one tomorrow at [facility] radiology at 0900. She needs to arrive at 0845 to register. Jessica Payne verbalized understanding.

## 2015-12-17 ENCOUNTER — Ambulatory Visit (HOSPITAL_COMMUNITY)
Admission: RE | Admit: 2015-12-17 | Discharge: 2015-12-17 | Disposition: A | Discharge status: Home / Self Care | Payer: BLUE CROSS/BLUE SHIELD | Source: Ambulatory Visit | Attending: Oncology | Admitting: Oncology

## 2015-12-17 DIAGNOSIS — C482 Malignant neoplasm of peritoneum, unspecified: Secondary | ICD-10-CM

## 2015-12-17 DIAGNOSIS — C786 Secondary malignant neoplasm of retroperitoneum and peritoneum: Secondary | ICD-10-CM | POA: Insufficient documentation

## 2015-12-17 DIAGNOSIS — R180 Malignant ascites: Secondary | ICD-10-CM | POA: Insufficient documentation

## 2015-12-17 NOTE — Procedures (Signed)
Ultrasound-guided paracentesis performed yielding 2.9L of clear yellow fluid.  No immediate complications.  Jessica Payne E 12/17/2015

## 2015-12-19 ENCOUNTER — Other Ambulatory Visit: Payer: Self-pay

## 2015-12-19 ENCOUNTER — Other Ambulatory Visit: Payer: Self-pay | Admitting: Oncology

## 2015-12-19 DIAGNOSIS — C541 Malignant neoplasm of endometrium: Secondary | ICD-10-CM

## 2015-12-20 ENCOUNTER — Ambulatory Visit (HOSPITAL_BASED_OUTPATIENT_CLINIC_OR_DEPARTMENT_OTHER): Payer: BLUE CROSS/BLUE SHIELD

## 2015-12-20 ENCOUNTER — Other Ambulatory Visit (HOSPITAL_BASED_OUTPATIENT_CLINIC_OR_DEPARTMENT_OTHER): Payer: BLUE CROSS/BLUE SHIELD

## 2015-12-20 ENCOUNTER — Encounter: Payer: Self-pay | Admitting: Oncology

## 2015-12-20 ENCOUNTER — Ambulatory Visit (HOSPITAL_BASED_OUTPATIENT_CLINIC_OR_DEPARTMENT_OTHER): Payer: BLUE CROSS/BLUE SHIELD | Admitting: Oncology

## 2015-12-20 VITALS — BP 111/70 | HR 129 | Temp 97.4°F | Resp 18 | Ht 67.0 in | Wt 137.9 lb

## 2015-12-20 DIAGNOSIS — C482 Malignant neoplasm of peritoneum, unspecified: Secondary | ICD-10-CM

## 2015-12-20 DIAGNOSIS — C541 Malignant neoplasm of endometrium: Secondary | ICD-10-CM

## 2015-12-20 DIAGNOSIS — C55 Malignant neoplasm of uterus, part unspecified: Secondary | ICD-10-CM

## 2015-12-20 DIAGNOSIS — K567 Ileus, unspecified: Secondary | ICD-10-CM | POA: Diagnosis not present

## 2015-12-20 DIAGNOSIS — R64 Cachexia: Secondary | ICD-10-CM

## 2015-12-20 DIAGNOSIS — D509 Iron deficiency anemia, unspecified: Secondary | ICD-10-CM

## 2015-12-20 DIAGNOSIS — G620 Drug-induced polyneuropathy: Secondary | ICD-10-CM

## 2015-12-20 DIAGNOSIS — Z66 Do not resuscitate: Secondary | ICD-10-CM

## 2015-12-20 DIAGNOSIS — R188 Other ascites: Secondary | ICD-10-CM | POA: Diagnosis not present

## 2015-12-20 DIAGNOSIS — R180 Malignant ascites: Secondary | ICD-10-CM

## 2015-12-20 DIAGNOSIS — D6481 Anemia due to antineoplastic chemotherapy: Secondary | ICD-10-CM

## 2015-12-20 LAB — COMPREHENSIVE METABOLIC PANEL
ALT: 56 U/L — ABNORMAL HIGH (ref 0–55)
AST: 80 U/L — AB (ref 5–34)
Albumin: 1.4 g/dL — ABNORMAL LOW (ref 3.5–5.0)
Alkaline Phosphatase: 241 U/L — ABNORMAL HIGH (ref 40–150)
Anion Gap: 12 mEq/L — ABNORMAL HIGH (ref 3–11)
BUN: 12.5 mg/dL (ref 7.0–26.0)
CALCIUM: 6.9 mg/dL — AB (ref 8.4–10.4)
CHLORIDE: 98 meq/L (ref 98–109)
CO2: 24 mEq/L (ref 22–29)
Creatinine: 0.7 mg/dL (ref 0.6–1.1)
EGFR: 88 mL/min/{1.73_m2} — AB (ref >90)
Glucose: 97 mg/dl (ref 70–140)
POTASSIUM: 3.4 meq/L — AB (ref 3.5–5.1)
SODIUM: 133 meq/L — AB (ref 136–145)
Total Bilirubin: 0.94 mg/dL (ref 0.20–1.20)
Total Protein: 5.5 g/dL — ABNORMAL LOW (ref 6.4–8.3)

## 2015-12-20 LAB — CBC WITH DIFFERENTIAL/PLATELET
BASO%: 0.3 % (ref 0.0–2.0)
Basophils Absolute: 0 10*3/uL (ref 0.0–0.1)
EOS%: 0.2 % (ref 0.0–7.0)
Eosinophils Absolute: 0 10*3/uL (ref 0.0–0.5)
HCT: 38 % (ref 34.8–46.6)
HGB: 13.1 g/dL (ref 11.6–15.9)
LYMPH%: 11.4 % — AB (ref 14.0–49.7)
MCH: 31 pg (ref 25.1–34.0)
MCHC: 34.5 g/dL (ref 31.5–36.0)
MCV: 89.8 fL (ref 79.5–101.0)
MONO#: 0.8 10*3/uL (ref 0.1–0.9)
MONO%: 7 % (ref 0.0–14.0)
NEUT#: 9.7 10*3/uL — ABNORMAL HIGH (ref 1.5–6.5)
NEUT%: 81.1 % — AB (ref 38.4–76.8)
PLATELETS: 284 10*3/uL (ref 145–400)
RBC: 4.23 10*6/uL (ref 3.70–5.45)
RDW: 18.1 % — ABNORMAL HIGH (ref 11.2–14.5)
WBC: 11.9 10*3/uL — ABNORMAL HIGH (ref 3.9–10.3)
lymph#: 1.4 10*3/uL (ref 0.9–3.3)

## 2015-12-20 LAB — MAGNESIUM: Magnesium: <0.7 mg/dl — CL (ref 1.5–2.5)

## 2015-12-20 MED ORDER — HEPARIN SOD (PORK) LOCK FLUSH 100 UNIT/ML IV SOLN
500.0000 [IU] | Freq: Once | INTRAVENOUS | Status: AC
  Administered 2015-12-20: 500 [IU] via INTRAVENOUS
  Filled 2015-12-20: qty 5

## 2015-12-20 MED ORDER — SODIUM CHLORIDE 0.9 % IJ SOLN
10.0000 mL | Freq: Once | INTRAMUSCULAR | Status: AC
  Administered 2015-12-20: 10 mL via INTRAVENOUS
  Filled 2015-12-20: qty 10

## 2015-12-20 NOTE — Patient Instructions (Signed)

## 2015-12-20 NOTE — Progress Notes (Signed)
OFFICE PROGRESS NOTE   December 20, 2015   Physicians: Everitt Amber, Leanna Battles  INTERVAL HISTORY:  Patient is seen, together with sister, appointment having been set up at their request previously. She is progressively declining from progressive primary peritoneal and endometrial carcinomas, under care of Hospice and requiring paracenteses by IR intermittently to control malignant ascites.  Patient is extremely weak, able to take minimal po's, still using prn po oxycodone for abdominal pain. She had paracenteses for 1.3 liters on 12-23, for 3.1 liters on 1-10 and 2.9 liters on 12-17-15. She does get some relief of abcominal discomfort with paracenteses. Bowels move at times. No fever. SOB with exertion. Pain at coccyx with direct pressure, tho no skin breakdown. Family has to dress her. Some "warmth" when urinates, not frank dysuria. No bleeding. No LE swelling. No problems with PAC. She refuses to let Hospice come on a set schedule, tho they continue to support when she requests.    PAC, flushed 12-20-15, no blood return but flushed without difficulty Genetics testing 09-06-15 normal (Custom Gene Panel by [facility]) Pre-operative CA 125 1941 on 01-07-15 Flu vaccine 08-19-15     ONCOLOGIC HISTORY Patient had no regular medical care since she lost medical insurance. She had progressive abdominal swelling over ~ 6 months as well as some postmenopausal bleeding when she was admitted to [facility] in 11-2014 with abdominal pain and presumed peritonitis, initially thought to be either gall bladder related or cirrhosis. Initial paracentesis 12-11-2014 was for 3.5 liters, with malignant cytology consistent with gyn malignancy. Notes refer to CT and MRI "which demonstrated omental thickening, extensive ascites, slight enlargement of the right adnexa and a thickened endometrium (1.1 cm)." She was referred to Dr Josephina Shih, seen on 12-25-14 with endometrial biopsy not possible due to  cervical stenosis. She had second paracentesis for 4.2 liters on 01-04-15. CA 125 on 01-07-15 was 1941 (pre op).She had TAH BSO, omentectomy and radical debulking by Dr Denman George on 01-12-15, with 4 liters of ascites at time of surgery. Findings at surgery were of milial tumor over entire small bowel serosa, mesentery, diaphragm, plaque on bladder and involving cul de sac peritoneum. Surgery was optimal R1 cytoreduction. Pathology [phone]) demonstrated high grade serous carcinoma, IIIC primary peritoneal and at least IB endometrial.. Recommendation was for 6 cycles of carboplatin and taxol, then likely whole pelvic RT + vaginal brachytherapy if she has CR. US paracentesis 01-29-15 for 1.5 liters. She began dose dense carboplatin taxol on 02-04-15, given thru day 1 cycle 6 on 05-27-15 (taxol held cycle 6 due to peripheral neuropathy). She was found to have progressive disease within 4 weeks of completing carbo taxol, when admitted with SBO early 06-2015. She began salvage doxil on 07-12-15. Cycle 3 doxil held 10-11-26-14 with C diff colitis. Not able to continue with additional treatment, hospice referral made 10-2015.   Objective:  Vital signs in last 24 hours:  BP 111/70 mmHg  Pulse 129  Temp(Src) 97.4 F (36.3 C) (Oral)  Resp 18  Ht _0  (1.702 m)  Wt 137 lb 14.4 oz (62.551 kg)  BMI 21.59 kg/m2  SpO2 99% Weight down another 10 lbs from 11-08-15. Color poor, pale, appears weak and cachectic, uncomfortable in WC,. Alert, oriented and very sweet as always.  HEENT:PERRL, sclerae not icteric. Oral mucosa dry without lesions, No JVD.  Lymphatics:no cervical,suraclavicular adenopathy Resp: diminished BS thruout otherwise clear to auscultation bilaterally  Cardio: regular rate and rhythm. No gallop. GI: abdomen full, quiet, not tender to  gentle exam  Musculoskeletal/ Extremities: without pitting edema, cords, tenderness, Marked muscle wasting Neuro:speech fluent and appropriate, moves all extremities Skin  without rash, ecchymosis Portacath-without erythema or tenderness  Lab Results:  Results for orders placed or performed in visit on 12/20/15  CBC with Differential  Result Value Ref Range   WBC 11.9 (H) 3.9 - 10.3 10e3/uL   NEUT# 9.7 (H) 1.5 - 6.5 10e3/uL   HGB 13.1 11.6 - 15.9 g/dL   HCT 38.0 34.8 - 46.6 %   Platelets 284 145 - 400 10e3/uL   MCV 89.8 79.5 - 101.0 fL   MCH 31.0 25.1 - 34.0 pg   MCHC 34.5 31.5 - 36.0 g/dL   RBC 4.23 3.70 - 5.45 10e6/uL   RDW 18.1 (H) 11.2 - 14.5 %   lymph# 1.4 0.9 - 3.3 10e3/uL   MONO# 0.8 0.1 - 0.9 10e3/uL   Eosinophils Absolute 0.0 0.0 - 0.5 10e3/uL   Basophils Absolute 0.0 0.0 - 0.1 10e3/uL   NEUT% 81.1 (H) 38.4 - 76.8 %   LYMPH% 11.4 (L) 14.0 - 49.7 %   MONO% 7.0 0.0 - 14.0 %   EOS% 0.2 0.0 - 7.0 %   BASO% 0.3 0.0 - 2.0 %  Magnesium - [facility]  Result Value Ref Range   Magnesium <0.7 (LL) 1.5 - 2.5 mg/dl  Comprehensive metabolic panel  Result Value Ref Range   Sodium 133 (L) 136 - 145 mEq/L   Potassium 3.4 (L) 3.5 - 5.1 mEq/L   Chloride 98 98 - 109 mEq/L   CO2 24 22 - 29 mEq/L   Glucose 97 70 - 140 mg/dl   BUN 12.5 7.0 - 26.0 mg/dL   Creatinine 0.7 0.6 - 1.1 mg/dL   Total Bilirubin 0.94 0.20 - 1.20 mg/dL   Alkaline Phosphatase 241 (H) 40 - 150 U/L   AST 80 (H) 5 - 34 U/L   ALT 56 (H) 0 - 55 U/L   Total Protein 5.5 (L) 6.4 - 8.3 g/dL   Albumin 1.4 (L) 3.5 - 5.0 g/dL   Calcium 6.9 (L) 8.4 - 10.4 mg/dL   Anion Gap 12 (H) 3 - 11 mEq/L   EGFR 88 (L) >90 ml/min/1.73 m2    Magnesium low as previously, still has mag oxide listed on home meds  Studies/Results:  No results found.  Medications: I have reviewed the patient's current medications.  DISCUSSION Neither patient nor sister requesting any change in care now, including no aggressive support. We will check on her by phone from this office ~ every other week, set up paracenteses on prn basis tho need to be sure from Hospice evaluation that she is able to tolerate transport for  procedure and that BP is adequate at home prior to setting up paracenteses (so will not set up standing appointments for this). I will see her if needed, but she is not able to continue regular visits to office now. 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 Progressed around completion of adjuvant carbo taxol. Salvage doxil attempted, but no response to the treatment that she could tolerate. Now in terminal phases under care of Hospice. Genetics testing negative 2. C diff colitis previously  3.SBO symptoms intermittently x  last several months. Ileus. Should not use medications that slow bowel motility for this reason if can be avoided 4.PAC in 5. Hypomagnesemia, hypo K : no improvement after IV magnesium last. Po potassium and  po magnesium only if tolerated.  6.long past tobacco, COPD 7. Iron deficiency + chemo anemia: post IV feraheme.  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 Post partial thyroidectomy for goiter ~ 1980, on medication until ~ 1 year ago. PCP managing. 10. DNR per patient's request 11.flu vaccine done 08-19-15  Patient and sister comforted; they know that they can call at any time if needed here or to hospice. Time spent 15 min. Cc Dr Candace Gallus, MD   12/20/2015, 5:24 PM

## 2015-12-21 DIAGNOSIS — R64 Cachexia: Secondary | ICD-10-CM | POA: Insufficient documentation

## 2015-12-27 ENCOUNTER — Telehealth: Payer: Self-pay | Admitting: *Deleted

## 2015-12-27 DIAGNOSIS — C541 Malignant neoplasm of endometrium: Secondary | ICD-10-CM

## 2015-12-27 DIAGNOSIS — R180 Malignant ascites: Secondary | ICD-10-CM

## 2015-12-27 NOTE — Telephone Encounter (Signed)
Patient's sister [name] called requesting collaborative. Reports patient" in no distress but her tummy is starting to feel like she needs a paracentesis."  Call transferred to [phone].

## 2015-12-28 NOTE — Telephone Encounter (Signed)
Spoke with sister [name] and told her that a paracentessis is scheduled for tomorrow 12-29-15 at 1000 at [facility].  Arrive at 0945 to check in. Ivin Booty is going to check Ms. Woodbury BP today as recommended in note below by Dr. Marko Plume. This nurse will speak with Ivin Booty ~3pm today for result of BP reading.

## 2015-12-28 NOTE — Telephone Encounter (Signed)
Patient Demographics     Patient Name Sex DOB SSN Address Phone    Jessica Payne, Jessica Payne Female [birth year] [id number] [place] [id number] [phone] (Home) [phone] (Mobile)      Message  Received: 1 week ago    Gordy Levan, MD  Baruch Merl, RN           RN please check on her ~ every other week to see if uncomfortable from ascites. Call pt, if no answer try sister as pt having trouble working cell phone.  Appears to be progressively declining and weak, may not tolerate transportation for paracentesis etc. Probably best to have Hospice check BP if considering paracentesis   thanks

## 2015-12-28 NOTE — Telephone Encounter (Signed)
Sister Ivin Booty stated that Jessica Payne's BP today is 118/82 P-108. She will go for a paracentesis in the morning as scheduled.

## 2015-12-29 ENCOUNTER — Ambulatory Visit (HOSPITAL_COMMUNITY)
Admission: RE | Admit: 2015-12-29 | Discharge: 2015-12-29 | Disposition: A | Discharge status: Home / Self Care | Payer: BLUE CROSS/BLUE SHIELD | Source: Ambulatory Visit | Attending: Oncology | Admitting: Oncology

## 2015-12-29 DIAGNOSIS — Z8589 Personal history of malignant neoplasm of other organs and systems: Secondary | ICD-10-CM | POA: Insufficient documentation

## 2015-12-29 DIAGNOSIS — R180 Malignant ascites: Secondary | ICD-10-CM | POA: Diagnosis not present

## 2015-12-29 DIAGNOSIS — C541 Malignant neoplasm of endometrium: Secondary | ICD-10-CM | POA: Insufficient documentation

## 2015-12-29 NOTE — Procedures (Signed)
Ultrasound-guided  therapeutic paracentesis performed yielding 2.4 liters of yellow  fluid. No immediate complications.  

## 2015-12-31 ENCOUNTER — Telehealth: Payer: Self-pay

## 2015-12-31 DIAGNOSIS — C482 Malignant neoplasm of peritoneum, unspecified: Secondary | ICD-10-CM

## 2015-12-31 MED ORDER — PROCHLORPERAZINE MALEATE 10 MG PO TABS: 10.0000 mg | ORAL_TABLET | Freq: Four times a day (QID) | ORAL | Status: AC | PRN

## 2015-12-31 NOTE — Telephone Encounter (Signed)
-----   Message from Gordy Levan, MD sent at 12/31/2015 11:47 AM EST ----- Regarding: RE: Hospice pt Rx Compazine 10 mg every 6 hr prn fine #20 Thank you ----- Message -----    From: Prentiss Bells, RN    Sent: 12/31/2015  11:28 AM      To: Janace Hoard, RN, [facility] Subject: Hospice pt Rx                                  Hospice reports pt c/o nausea, vomiting and heartburn.  Pt is taking only ativan.  She will not take Zofran - she is afraid of constipation.  Hospice requesting alternative medications for nausea (she suggests compazine) and heartburn.  Thanks Ruston Fedora

## 2015-12-31 NOTE — Telephone Encounter (Signed)
Order placed, receipt confirmed by pharmacy

## 2016-01-07 ENCOUNTER — Telehealth: Payer: Self-pay

## 2016-01-07 ENCOUNTER — Other Ambulatory Visit: Payer: Self-pay

## 2016-01-07 DIAGNOSIS — C482 Malignant neoplasm of peritoneum, unspecified: Secondary | ICD-10-CM

## 2016-01-07 NOTE — Telephone Encounter (Signed)
Spoke with Sister  [name].  She requested to have a paracentesis scheduled for her sister Ms. Jessica Payne.  She stated that Hospice nurse said that her BP was a good range and would be ok for a paracentesis. Appointment is scheduled for 01-10-16 at [facility] at 2 pm.  She needs to register in radiology at 1:45 pm.  Ms. Jessica Payne verbalized understanding.

## 2016-01-07 NOTE — Telephone Encounter (Signed)
-----   Message from Gordy Levan, MD sent at 12/20/2015  4:27 PM EST ----- RN please check on her ~ every other week to see if uncomfortable from ascites. Call pt, if no answer try sister as pt having trouble working cell phone.  Appears to be progressively declining and weak, may not tolerate transportation for paracentesis etc. Probably best to have Hospice check BP if considering paracentesis  thanks

## 2016-01-10 ENCOUNTER — Ambulatory Visit (HOSPITAL_COMMUNITY)
Admission: RE | Admit: 2016-01-10 | Discharge: 2016-01-10 | Disposition: A | Discharge status: Home / Self Care | Payer: BLUE CROSS/BLUE SHIELD | Source: Ambulatory Visit | Attending: Oncology | Admitting: Oncology

## 2016-01-10 DIAGNOSIS — C482 Malignant neoplasm of peritoneum, unspecified: Secondary | ICD-10-CM

## 2016-01-10 DIAGNOSIS — Z8542 Personal history of malignant neoplasm of other parts of uterus: Secondary | ICD-10-CM | POA: Diagnosis not present

## 2016-01-10 DIAGNOSIS — Z8589 Personal history of malignant neoplasm of other organs and systems: Secondary | ICD-10-CM | POA: Diagnosis not present

## 2016-01-10 DIAGNOSIS — R188 Other ascites: Secondary | ICD-10-CM | POA: Diagnosis not present

## 2016-01-10 NOTE — Procedures (Signed)
Ultrasound-guided  therapeutic paracentesis performed yielding 2 liters of yellow  fluid. No immediate complications.

## 2016-01-19 ENCOUNTER — Telehealth: Payer: Self-pay

## 2016-01-19 NOTE — Telephone Encounter (Signed)
S/w sister [name] and as of now pt is doing "all right, she's weak". Ivin Booty states they are keeping an eye on when pt needs paracentesis and knows to call.

## 2016-01-21 ENCOUNTER — Telehealth: Payer: Self-pay

## 2016-01-24 ENCOUNTER — Telehealth: Payer: Self-pay

## 2016-01-24 NOTE — Telephone Encounter (Signed)
Karel with hospice called to inform us that pt died on 01-29-2023 at 83 PM. At home with family.

## 2016-01-27 ENCOUNTER — Encounter: Payer: Self-pay | Admitting: Oncology

## 2016-01-27 NOTE — Progress Notes (Signed)
Medical Oncology  Death certificate completed, patient died at home with hospice 01/25/16.  Sympathy letter written to family.  Will let Drs Denman George and Philip Aspen know, as patient and I very much appreciated their assistance with her care.   Godfrey Pick, MD

## 2016-02-19 NOTE — Telephone Encounter (Signed)
Jessica Payne with hospice called to keep [place] informed. The pt is transitioning at this time, she is closer to dying. She is at her mother's house. No needs voice per Jinny Blossom at this time.

## 2016-02-19 DEATH — deceased

## 2016-03-24 IMAGING — US US PARACENTESIS
1 series · 5 of 5 positions shown · non-contrast
Comparison: None.

MEDICATIONS:
None.

COMPLICATIONS:
None immediate

INDICATION: Recurrent malignant ascites. Request is made for therapeutic
paracentesis.

EXAM:
ULTRASOUND-GUIDED THERAPEUTIC PARACENTESIS
TECHNIQUE: Informed written consent was obtained from the patient after a
discussion of the risks, benefits and alternatives to treatment. A
timeout was performed prior to the initiation of the procedure.

[Series 1: us paracentesis · 0.27mm/px · 5 of 5 slices shown]
[im 1/5]
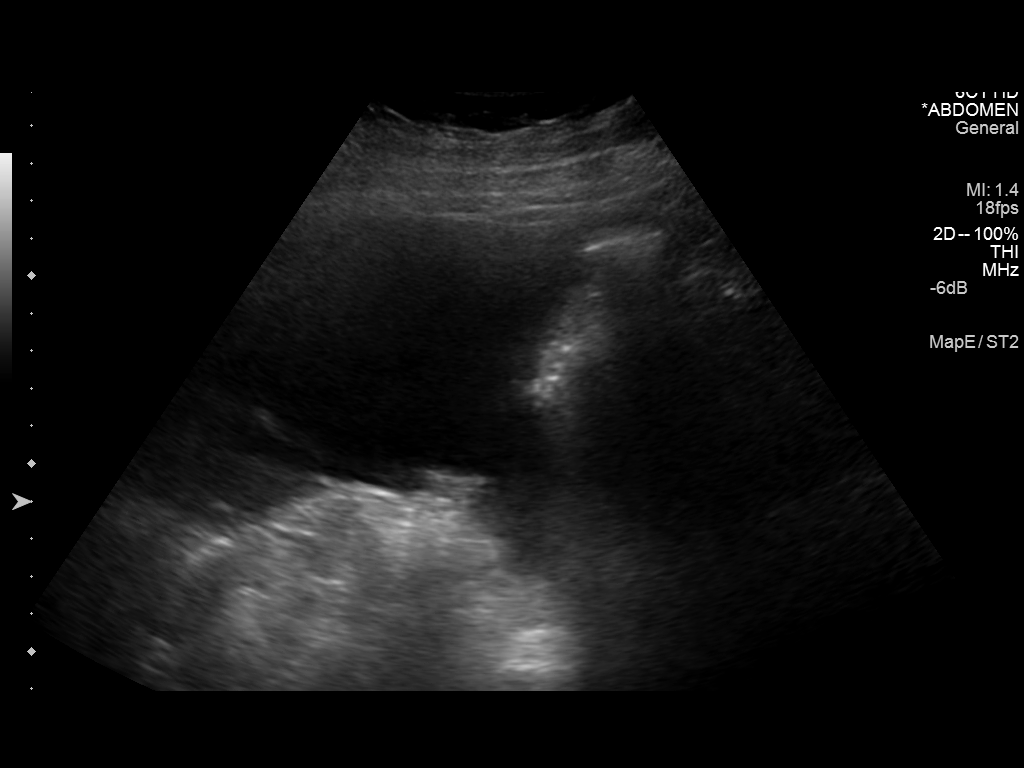
[im 2/5]
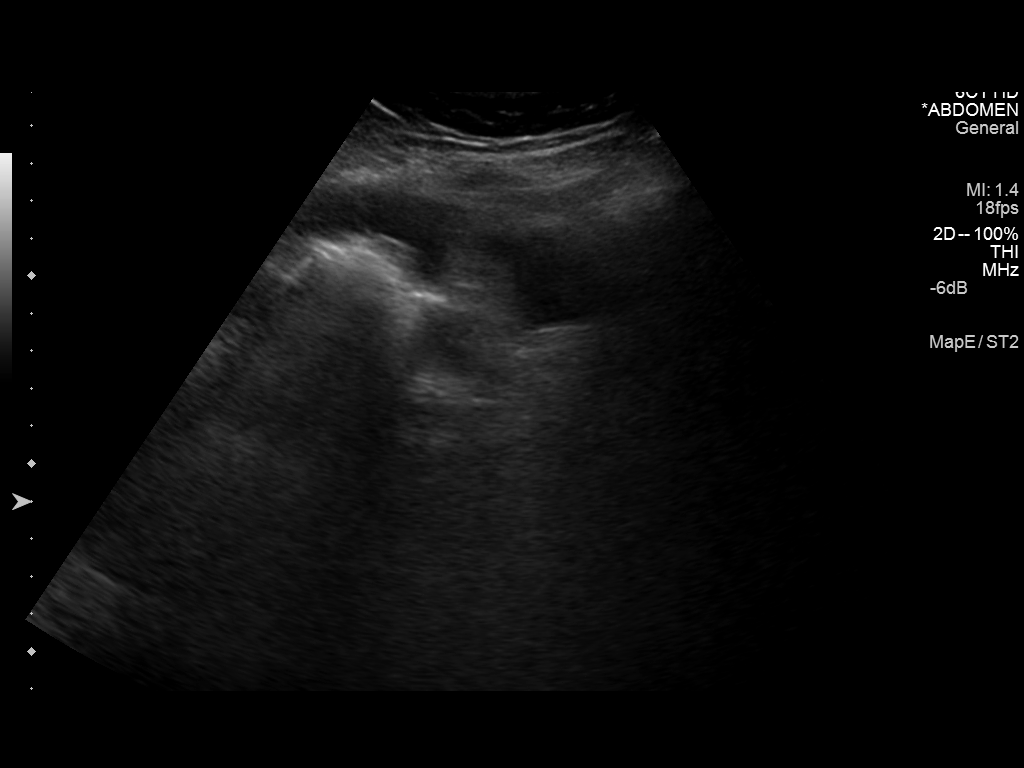
[im 3/5]
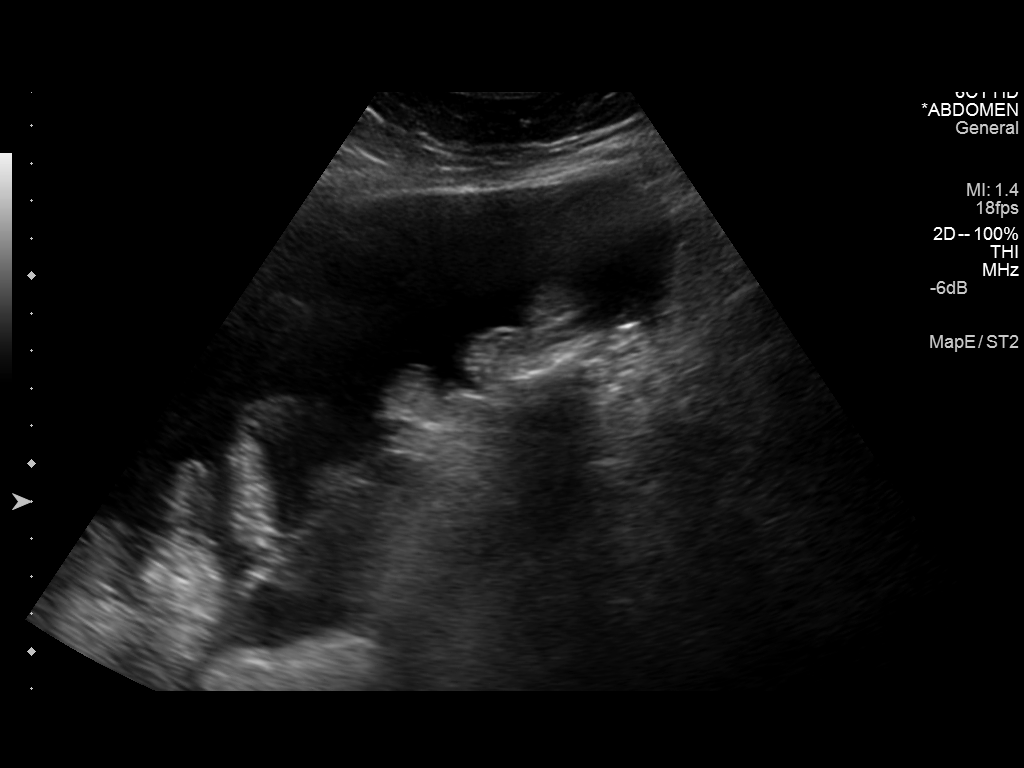
[im 4/5]
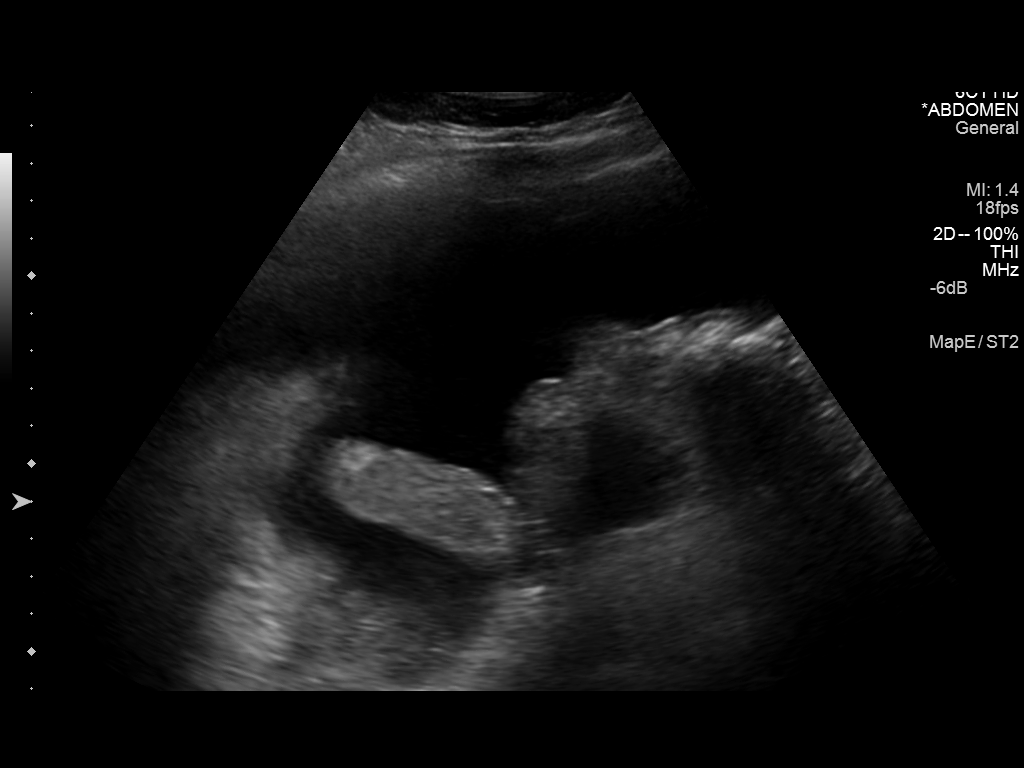
[im 5/5]
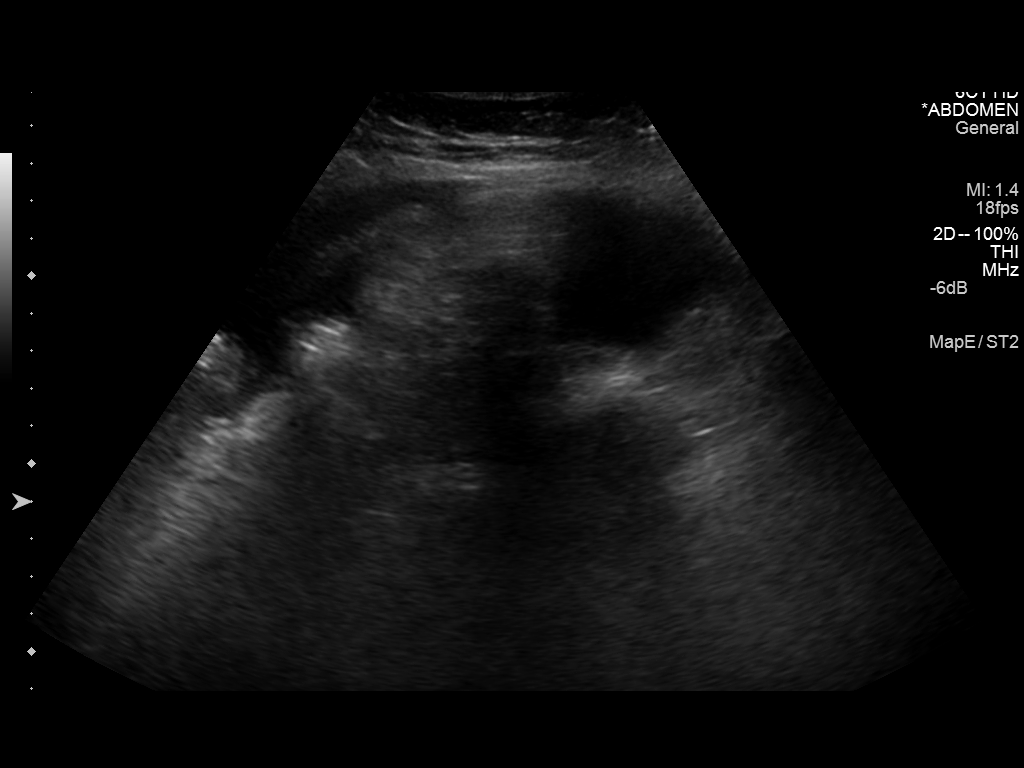

[5 of 5 positions shown; findings below may reference images not displayed]

Initial ultrasound scanning demonstrates a moderate to large amount
of ascites within the left lower abdominal quadrant. The left lower
abdomen was prepped and draped in the usual sterile fashion. 1%
lidocaine was used for local anesthesia. Under direct ultrasound
guidance, a 19 gauge, 10-cm, Yueh catheter was introduced. An
ultrasound image was saved for documentation purposed. The
paracentesis was performed. The catheter was removed and a dressing
was applied. The patient tolerated the procedure well without
immediate post procedural complication.
FINDINGS: A total of approximately 4.2 liters of slightly turbid, yellow fluid
was removed.
IMPRESSION: Successful ultrasound-guided therapeutic paracentesis yielding
liters of peritoneal fluid.

## 2016-03-27 IMAGING — CR DG CHEST 2V
2 series · 2 of 2 positions shown · non-contrast
Comparison: No priors.

CLINICAL DATA: 63-year-old female under preoperative evaluation for
upcoming total hysterectomy.

EXAM:
CHEST  2 VIEW

[w chest pa]
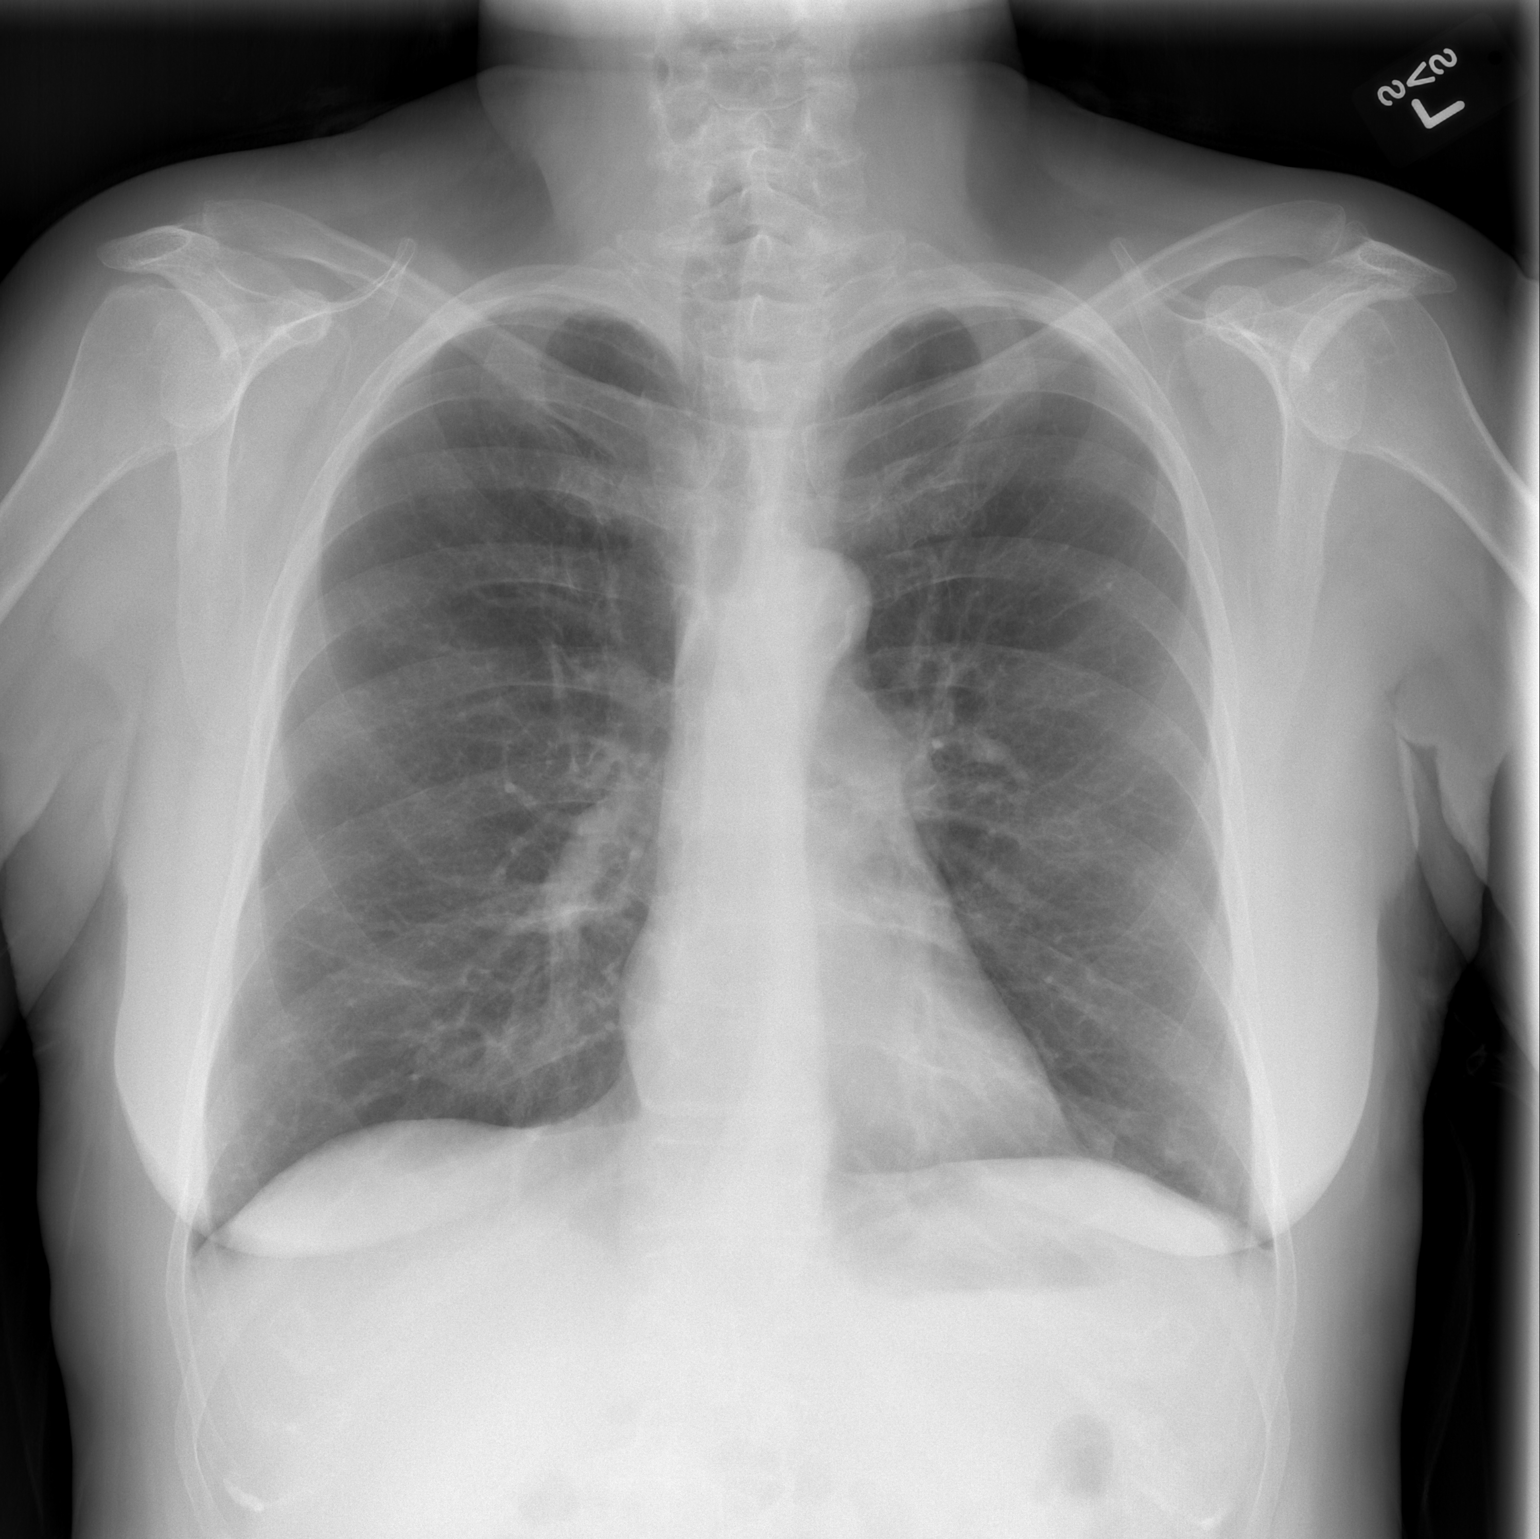

[w chest lat]
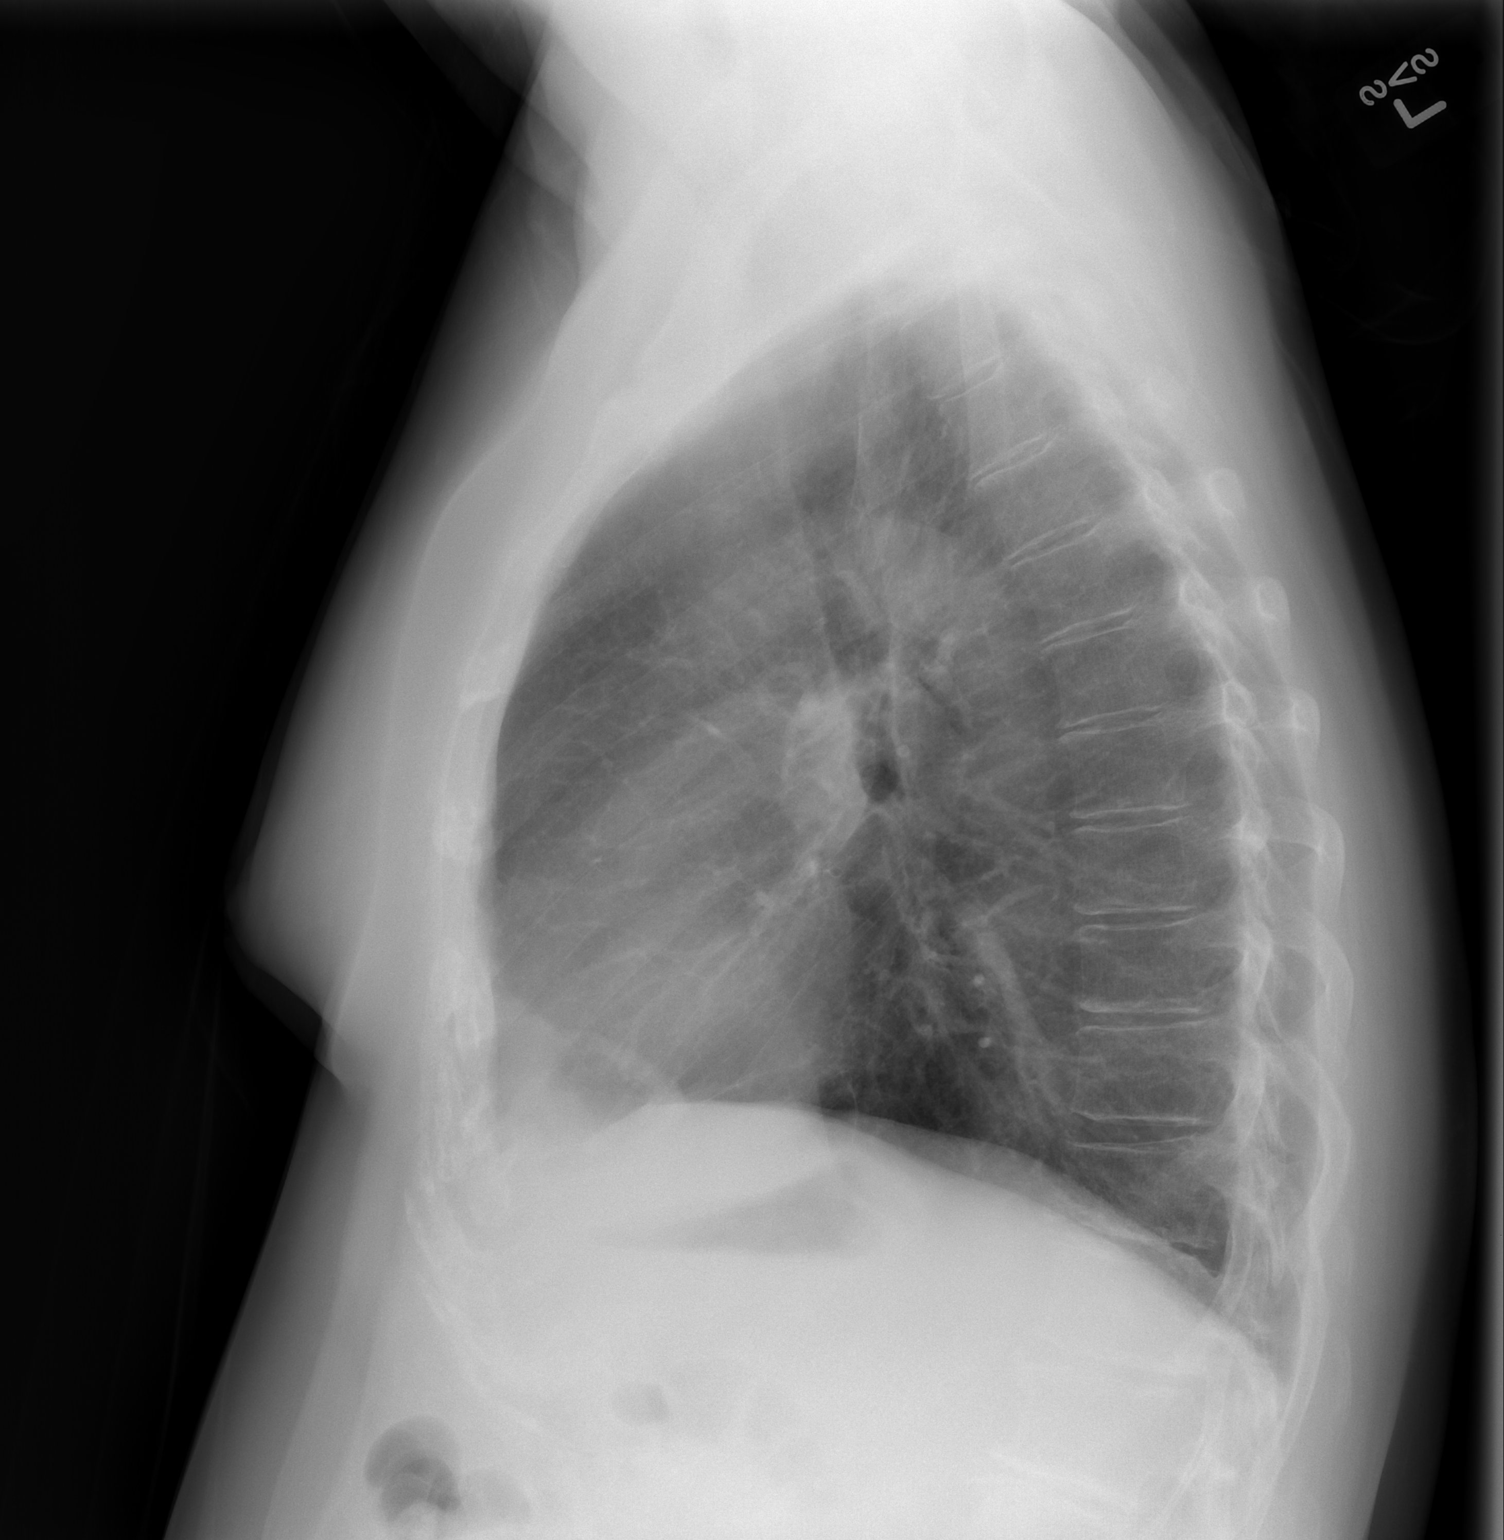

[2 of 2 positions shown; findings below may reference images not displayed]

FINDINGS: Lung volumes are normal. No consolidative airspace disease. No
pleural effusions. No pneumothorax. No pulmonary nodule or mass
noted. Pulmonary vasculature and the cardiomediastinal silhouette
are within normal limits. Atherosclerotic calcifications in the
thoracic aorta.
IMPRESSION: 1. No radiographic evidence of acute cardiopulmonary disease.
2. Atherosclerosis.

## 2016-05-08 ENCOUNTER — Other Ambulatory Visit: Payer: Self-pay | Admitting: Nurse Practitioner

## 2016-09-25 IMAGING — XA IR US GUIDE VASC ACCESS RIGHT
1 series · 1 of 1 positions shown · non-contrast
Comparison: none

CLINICAL DATA: Endometrial carcinoma, needs access for chemotherapy
TECHNIQUE: The procedure, risks, benefits, and alternatives were explained to
the patient. Questions regarding the procedure were encouraged and
answered. The patient understands and consents to the procedure. As
antibiotic prophylaxis, vancomycin 1 g was ordered pre-procedure and
administered intravenously within one hour of incision. Patency of
the right IJ vein was confirmed with ultrasound with image
documentation. An appropriate skin site was determined. Skin site
was marked. Region was prepped using maximum barrier technique
including cap and mask, sterile gown, sterile gloves, large sterile
sheet, and Chlorhexidine as cutaneous antisepsis. The region was
infiltrated locally with 1% lidocaine. Under real-time ultrasound
guidance, the right IJ vein was accessed with a 21 gauge
micropuncture needle; the needle tip within the vein was confirmed
with ultrasound image documentation. Needle was exchanged over a 018
guidewire for transitional dilator which allowed passage of the
Benson wire into the IVC. Over this, the transitional dilator was
exchanged for a 5 French MPA catheter. A small incision was made on
the right anterior chest wall and a subcutaneous pocket fashioned.
The power-injectable port was positioned and its catheter tunneled
to the right IJ dermatotomy site. The MPA catheter was exchanged
over an Amplatz wire for a peel-away sheath, through which the port
catheter, which had been trimmed to the appropriate length, was
advanced and positioned under fluoroscopy with its tip at the
cavoatrial junction. Spot chest radiograph confirms good catheter
position and no pneumothorax. The pocket was closed with deep
interrupted and subcuticular continuous 3-0 Monocryl sutures. The
port was flushed per protocol. The incisions were covered with
Dermabond then covered with a sterile dressing.

COMPLICATIONS:
COMPLICATIONS
None immediate

[Series 300: ir fluoro guide cv line left · 1 of 1 slices shown]
[im 1/1]
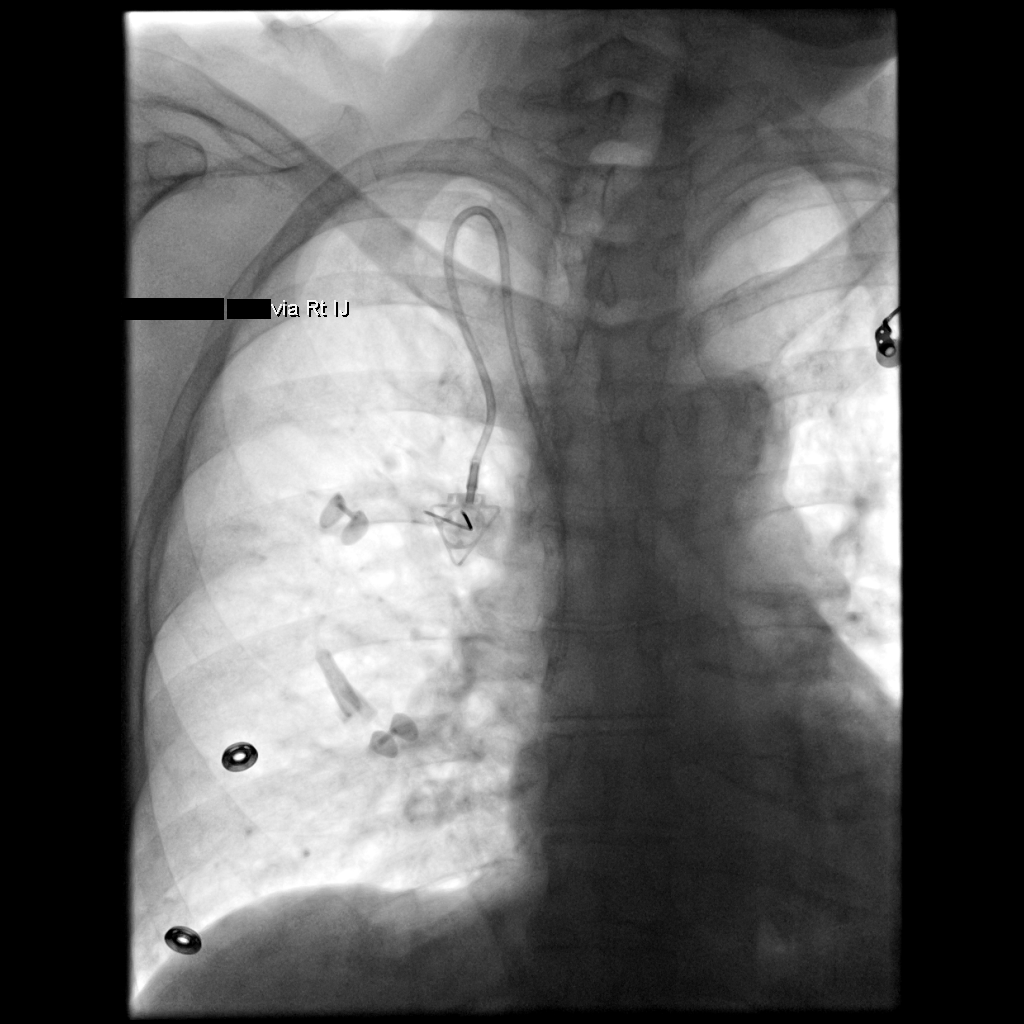

[1 of 1 positions shown; findings below may reference images not displayed]

EXAM:
TUNNELED PORT CATHETER PLACEMENT WITH ULTRASOUND AND FLUOROSCOPIC
GUIDANCE

FLUOROSCOPY TIME:  6 seconds, 1 mGy

ANESTHESIA/SEDATION:
Intravenous Fentanyl and Versed were administered as conscious
sedation during continuous cardiorespiratory monitoring by the
radiology RN, with a total moderate sedation time of 15 minutes.
IMPRESSION: Technically successful right IJ power-injectable port catheter
placement. Ready for routine use.
